# Patient Record
Sex: Male | Born: 1980
Health system: Southern US, Community
[De-identification: ages and names within clinical notes are randomized; demographics above are authoritative.]

## PROBLEM LIST (undated history)

## (undated) DIAGNOSIS — F1490 Cocaine use, unspecified, uncomplicated: Secondary | ICD-10-CM

## (undated) HISTORY — DX: Cocaine use, unspecified, uncomplicated: F14.90

## (undated) HISTORY — PX: OTHER SURGICAL HISTORY: SHX169

---

## 2009-04-18 ENCOUNTER — Emergency Department (HOSPITAL_COMMUNITY): Admission: EM | Admit: 2009-04-18 | Discharge: 2009-04-18 | Payer: Self-pay | Admitting: Emergency Medicine

## 2010-09-12 LAB — DIFFERENTIAL
Eosinophils Relative: 2 % (ref 0–5)
Lymphocytes Relative: 25 % (ref 12–46)
Lymphs Abs: 1.6 10*3/uL (ref 0.7–4.0)

## 2010-09-12 LAB — CBC
HCT: 49.2 % (ref 39.0–52.0)
Hemoglobin: 17.2 g/dL — ABNORMAL HIGH (ref 13.0–17.0)
Platelets: 324 10*3/uL (ref 150–400)
WBC: 6.6 10*3/uL (ref 4.0–10.5)

## 2010-09-12 LAB — POCT I-STAT, CHEM 8
BUN: 12 mg/dL (ref 6–23)
Chloride: 105 mEq/L (ref 96–112)
Creatinine, Ser: 0.9 mg/dL (ref 0.4–1.5)
Potassium: 3.6 mEq/L (ref 3.5–5.1)
Sodium: 141 mEq/L (ref 135–145)
TCO2: 26 mmol/L (ref 0–100)

## 2010-09-12 LAB — POCT CARDIAC MARKERS
CKMB, poc: 2.8 ng/mL (ref 1.0–8.0)
Myoglobin, poc: 127 ng/mL (ref 12–200)
Troponin i, poc: <0.05 ng/mL (ref 0.00–0.09)

## 2010-09-12 LAB — RAPID STREP SCREEN (MED CTR MEBANE ONLY): Streptococcus, Group A Screen (Direct): NEGATIVE

## 2014-09-21 ENCOUNTER — Emergency Department (INDEPENDENT_AMBULATORY_CARE_PROVIDER_SITE_OTHER)
Admission: EM | Admit: 2014-09-21 | Discharge: 2014-09-21 | Disposition: A | Discharge status: Home / Self Care | Payer: Self-pay | Source: Home / Self Care | Attending: Emergency Medicine | Admitting: Emergency Medicine

## 2014-09-21 ENCOUNTER — Encounter (HOSPITAL_COMMUNITY): Payer: Self-pay | Admitting: Emergency Medicine

## 2014-09-21 ENCOUNTER — Ambulatory Visit: Payer: Self-pay | Attending: Internal Medicine

## 2014-09-21 DIAGNOSIS — H6692 Otitis media, unspecified, left ear: Secondary | ICD-10-CM

## 2014-09-21 DIAGNOSIS — H7292 Unspecified perforation of tympanic membrane, left ear: Secondary | ICD-10-CM

## 2014-09-21 MED ORDER — CIPROFLOXACIN-DEXAMETHASONE 0.3-0.1 % OT SUSP
4.0000 [drp] | Freq: Two times a day (BID) | OTIC | Status: DC
Start: 2014-09-21 — End: 2015-01-18

## 2014-09-21 NOTE — ED Provider Notes (Signed)
CSN: [id number]     Arrival date & time 09/21/14  1326 History   First MD Initiated Contact with Patient 09/21/14 1359     Chief Complaint  Patient presents with  . Otalgia  . Dental Pain   (Consider location/radiation/quality/duration/timing/severity/associated sxs/prior Treatment) HPI Comments: Reports occasional "nasty" drainage from left ear canal. States he has not been seen or treated for this in the past.  PCP: none States he works at [employer]  Patient is a 34 y.o. male presenting with ear pain. The history is provided by the patient.  Otalgia Location:  Left Behind ear:  No abnormality Quality:  Sore Severity:  Mild Onset quality:  Gradual Duration:  3 months Timing:  Constant Progression:  Unchanged Associated symptoms: rhinorrhea     History reviewed. No pertinent past medical history. History reviewed. No pertinent past surgical history. No family history on file. History  Substance Use Topics  . Smoking status: Never Smoker   . Smokeless tobacco: Not on file  . Alcohol Use: No    Review of Systems  HENT: Positive for ear pain and rhinorrhea.   All other systems reviewed and are negative.   Allergies  Tylenol  Home Medications   Prior to Admission medications   Medication Sig Start Date End Date Taking? Authorizing Provider  ciprofloxacin-dexamethasone (CIPRODEX) otic suspension Place 4 drops into the left ear 2 (two) times daily. X 7 days 09/21/14   Jess BartersJennifer Lee H Presson, PA   BP 126/70 mmHg  Pulse 50  Temp(Src) 99 F (37.2 C) (Oral)  Resp 20  SpO2 99% Physical Exam  Constitutional: He is oriented to person, place, and time. He appears well-developed and well-nourished. No distress.  HENT:  Head: Normocephalic and atraumatic.  Right Ear: Hearing, tympanic membrane, external ear and ear canal normal.  Left Ear: There is drainage. No mastoid tenderness. Tympanic membrane is injected, perforated and erythematous. A middle ear effusion is  present. Decreased hearing is noted.  Nose: Mucosal edema and rhinorrhea present.  Mouth/Throat: Uvula is midline, oropharynx is clear and moist and mucous membranes are normal.  Eyes: Conjunctivae are normal.  Neck: Normal range of motion. Neck supple.  Cardiovascular: Normal rate, regular rhythm and normal heart sounds.   Pulmonary/Chest: Effort normal and breath sounds normal.  Musculoskeletal: Normal range of motion.  Neurological: He is alert and oriented to person, place, and time.  Skin: Skin is warm and dry.  Psychiatric: He has a normal mood and affect. His behavior is normal.  Nursing note and vitals reviewed.   ED Course  Procedures (including critical care time) Labs Review Labs Reviewed - No data to display  Imaging Review No results found.   MDM   1. Chronic otitis media of left ear, unspecified otitis media type   2. Perforated tympanic membrane, left    Ciprodex as directed with ENT follow up in 2 weeks     Ria ClockJennifer Lee H Presson, PA 09/21/14 1516

## 2014-09-21 NOTE — ED Notes (Signed)
C/o left ear pain onset 2 weeks Also reports dental pain onset 6 months +++. Has a loose tooth Denies fevers, chills Alert, no signs of acute distress.

## 2015-01-04 ENCOUNTER — Encounter (HOSPITAL_COMMUNITY): Payer: Self-pay | Admitting: Emergency Medicine

## 2015-01-04 ENCOUNTER — Emergency Department (INDEPENDENT_AMBULATORY_CARE_PROVIDER_SITE_OTHER)
Admission: EM | Admit: 2015-01-04 | Discharge: 2015-01-04 | Disposition: A | Discharge status: Home / Self Care | Payer: Self-pay | Source: Home / Self Care | Attending: Family Medicine | Admitting: Family Medicine

## 2015-01-04 DIAGNOSIS — B356 Tinea cruris: Secondary | ICD-10-CM

## 2015-01-04 DIAGNOSIS — R0981 Nasal congestion: Secondary | ICD-10-CM

## 2015-01-04 MED ORDER — CLOTRIMAZOLE 1 % EX CREA: TOPICAL_CREAM | CUTANEOUS | Status: DC

## 2015-01-04 MED ORDER — AMOXICILLIN 500 MG PO CAPS: 500.0000 mg | ORAL_CAPSULE | Freq: Three times a day (TID) | ORAL | Status: DC

## 2015-01-04 NOTE — ED Provider Notes (Signed)
CSN: [id number]     Arrival date & time 01/04/15  1401 History   First MD Initiated Contact with Patient 01/04/15 1530     No chief complaint on file.  (Consider location/radiation/quality/duration/timing/severity/associated sxs/prior Treatment) Patient is a 34 y.o. male presenting with cough. The history is provided by the patient. No language interpreter was used.  Cough Cough characteristics:  Non-productive Severity:  Moderate Onset quality:  Gradual Timing:  Constant Chronicity:  New Smoker: no   Context: upper respiratory infection   Worsened by:  Nothing tried Ineffective treatments:  None tried Associated symptoms: rhinorrhea   Pt reports facial pressure, nasal drainage since swimming several weeks ago.  Pt also has itcing around end of penis  History reviewed. No pertinent past medical history. History reviewed. No pertinent past surgical history. No family history on file. History  Substance Use Topics  . Smoking status: Never Smoker   . Smokeless tobacco: Not on file  . Alcohol Use: No    Review of Systems  HENT: Positive for rhinorrhea.   Respiratory: Positive for cough.   Genitourinary: Positive for penile pain.  All other systems reviewed and are negative.   Allergies  Tylenol  Home Medications   Prior to Admission medications   Medication Sig Start Date End Date Taking? Authorizing Provider  ciprofloxacin-dexamethasone (CIPRODEX) otic suspension Place 4 drops into the left ear 2 (two) times daily. X 7 days Patient not taking: Reported on 01/04/2015 09/21/14   Ria Clock, PA   There were no vitals taken for this visit. Physical Exam  Constitutional: He is oriented to person, place, and time. He appears well-developed and well-nourished.  HENT:  Head: Normocephalic.  Right Ear: External ear normal.  Left Ear: External ear normal.  Mouth/Throat: Oropharynx is clear and moist.  Edematous nasal mucosa,  Tender maxillary sinuses  Eyes: EOM  are normal.  Neck: Normal range of motion.  Pulmonary/Chest: Effort normal.  Abdominal: He exhibits no distension.  Genitourinary:  Fine raised bumps under foreskin,  Looks like yeast  Musculoskeletal: Normal range of motion.  Neurological: He is alert and oriented to person, place, and time.  Skin: Skin is warm.  Psychiatric: He has a normal mood and affect.  Nursing note and vitals reviewed.   ED Course  Procedures (including critical care time) Labs Review Labs Reviewed - No data to display  Imaging Review No results found.   MDM   1. Nasal sinus congestion   2. Tinea cruris    amoxicillian Lotrimin avs   Elson Areas, PA-C 01/04/15 1607

## 2015-01-04 NOTE — ED Notes (Signed)
Translator phone in use

## 2015-01-04 NOTE — ED Notes (Signed)
Patient has multiple complaints. Department translator phone used to assess patient.  PA utilized Transport planner

## 2015-01-04 NOTE — Discharge Instructions (Signed)
Infeccin de las vas areas superiores en los adultos (Upper Respiratory Infection, Adult)  La infeccin respiratoria de las vas areas superiores se conoce tambin como resfro comn. Las vas areas superiores [employer] senos nasales, la garganta, la trquea, y los bronquios. Los bronquios son las vas areas que conducen el aire a los pulmones. La mayor parte de las personas mejora luego de una [place], Armed forces training and education officer los sntomas pueden durar [employer]. La tos residual puede durar ms. CAUSAS Varios tipos de virus pueden causar la infeccin de los tejidos que cubren las vas areas superiores. Los tejidos se irritan y se inflaman y se originan secreciones. Tambin es frecuente la produccin de moco. El resfro es contagioso. El virus se disemina fcilmente a otras personas por contacto oral. Aqu se incluyen los besos, el compartir un vaso y el toser o Brewing technologist. Tambin puede diseminarse tocndose la boca o la [place] y luego tocando una superficie que luego tocan Producer, television/film/video.  SNTOMAS Los sntomas se desarrollan entre uno y tres das luego de Dietitian en contacto con el virus. Pueden variar de Ardelia Mems persona a otra. Incluyen:  Secrecin nasal.  Estornudos  Congestin nasal.  Irritacin de los senos nasales.  Dolor de Investment banker, operational.  Prdida de la voz (laringitis).  Tos.  Fatiga.  Dolores musculares.  Prdida del apetito.  Dolor de [place].  Fiebre no muy elevada. DIAGNSTICO Puede diagnosticarse a s mismo la infeccin respiratoria, segn los sntomas habituales, ya que la mayor parte de las personas se resfra dos o tres veces al ao. El profesional puede confirmarlo basndose en el examen fsico. Lo ms importante es que el profesional verifique que los sntomas no se deben a otra enfermedad como anginas, sinusitis, neumona, asma o epiglotitis. Para diagnosticar el resfrio comn, no es necesario que haga anlisis de [place], pruebas en la garganta o radiografas, pero en algunos  casos puede ser de utilidad para excluir otros problemas ms graves. El mdico decidir si necesita otras pruebas. RIESGOS Y COMPLICACIONES Tendr mayor riesgo de sufrir un resfro grave si consume cigarrillos, sufre una enfermedad cardaca (como insuficiencia cardaca) o pulmonar crnica (como asma) o si tiene un debilitamiento del sistema inmunolgico. Las personas muy jvenes o muy mayores tienen riesgo de sufrir infecciones ms graves. La sinusitis bacteriana, las infecciones del odo medio y la neumona bacteriana pueden complicar el resfro comn. El resfro puede exacerbar el asma y la enfermedad pulmonar obstructiva crnica. En algunos casos estas complicaciones requieren la atencin en un servicio de emergencias y pueden poner en peligro la vida. PREVENCIN La mejor manera de protegerse para no contraer un resfro es Theatre manager una buena higiene. Evite el contacto bucal o de las manos con personas con sntomas de resfro. Si se produce el contacto, lvese las manos con frecuencia. No hay pruebas firmes que indiquen que la vitamina C, la vitamina E, la equincea o la actividad fsica reduzcan las posibilidades de tener una infeccin. Sin embargo, siempre se recomienda Scientific laboratory technician y Lucilla Edin buena nutricin. TRATAMIENTO El tratamiento est dirigido a Herbalist sntomas. Esta enfermedad no tiene [place]. Los antibiticos no son eficaces, ya que esta infeccin la causa un virus y no una bacteria. El tratamiento incluye:  Aumente la ingesta de lquidos. Consumo de bebidas deportivas, que proporcionan electrolitos,azcares e hidratacin.  Inhale vapor caliente (de un vaporizador o de la ducha).  Tomar sopa de pollo u otros lquidos claros, y [employer] buena nutricin.  Descanse lo suficiente.  Haga grgaras o coma pastillas para Public house manager  las molestias.  Control de la fiebre con ibuprofeno o acetaminofen, segn las indicaciones del mdico.  Aumento del uso del inhalador, si sufre asma. Las  pastillas y los geles de zinc durante las primeras 24 horas de iniciado el resfro comn, pueden disminuir la duracin y Paramedic la gravedad de los sntomas. Los medicamentos para Chief Technology Officer pueden disminuir la fiebre, Paramedic los dolores musculares y Chief Technology Officer de Advertising copywriter. Se dispone de una gran variedad de medicamentos de venta libre para tratar la congestin y la secrecin nasal. El profesional podr recomendarle inhalantes para los otros sntomas. INSTRUCCIONES PARA EL CUIDADO DOMICILIARIO  Utilice los medicamentos de venta libre o de prescripcin para Chief Technology Officer, el malestar o la [place], segn se lo indique el profesional que lo asiste.  Utilice un vaporizador caliente o inhale vapor, haciendo salir agua de la ducha para aumentar la humedad [place]. Esto mantendr las secreciones hmedas y Community education officer ms fcil respirar.  Beba gran cantidad de lquido para mantener la orina de tono claro o color amarillo plido.  Descanse todo lo que pueda.  Regrese a su trabajo cuando la temperatura se haya normalizado, o cuando el profesional que lo asiste se lo indique. Quizs sea necesario que permanezca en su casa durante un tiempo ms prolongado para Buyer, retail a Economist. Tambin puede utilizar un barbijo y ser cuidadoso con el lavado de manos para evitar la diseminacin del virus. SOLICITE ATENCIN MDICA SI:  Luego de los primeros das siente que empeora en vez de [place].  Necesita que Occupational psychologist brinde ms informacin relacionada con los medicamentos para [employer] sntomas.  Siente escalofros, le falta el aire o escupe moco de color marrn o rojo. Estos pueden ser sntomas de neumona.  Tiene una secrecin nasal de color amarillo o marrn, o siente dolor en el rostro, especialmente cuando se inclina hacia adelante. Estos pueden ser sntomas de sinusitis.  Tiene fiebre, siente el cuello hinchado, tiene dolor al tragar u observa manchas blancas en el fondo de la garganta.  Estos pueden ser sntomas de angina por estreptococo. Romona Curls ATENCIN MDICA DE INMEDIATO SI:  Lance Muss.  Comienza a sentir Herbalist de cabeza intenso o persistente, dolor de odos, en el seno nasal o en el pecho.  Tiene tos y esta se prolonga demasiado, tose y escupe sangre, la mucosidad habitual se modifica (si tiene una enfermedad pulmonar crnica) o respira con dificultad.  Siente rigidez en el cuello o dolor de cabeza intenso. Document Released: 03/06/2005 Document Revised: 08/19/2011 [facility] Patient Information 2015 [place], [place]. This information is not intended to replace advice given to you by your health care provider. Make sure you discuss any questions you have with your health care provider. Prurito en la Ingle (Jock Itch) Prurito en la ingle es una infeccin por hongos en la piel de la ingle. A veces se lo denomina "culebrilla" pero no proviene de un gusano. Un hongo es un tipo de germen que crece en lugares oscuros y hmedos.  CAUSAS La infeccin podr diseminarse:  Por infeccin de hongos en cualquier parte del cuerpo (como el pie de atleta).  Compartir toallas o ropa. Esta infeccin es ms frecuente en:  Climas clidos y hmedos.  Personas que [place] ropa [place] o trajes de bao hmedos por [employer].  Deportistas.  Personas con sobrepeso.  Personas con diabtes. SNTOMAS Prurito en la ingle causa los siguientes sntomas:  Puntos rojos, rosados o marrones en la ingle. Puede expandirse a los muslos, nalgas y  ano.  Picazn. DIAGNSTICO El profesional hace el diagnstico a travs de la observacin de la erupcin. A veces se realiza un raspado de la piel para realizar un anlisis. El anlisis se obtiene mirando el microscopio o mediante un cultivo (prueba para Therapist, music). Este puede tomar [employer]. TRATAMIENTO Prurito en la ingle puede tratarse:  Cremas para la piel o pomadas para matar hongos.  Medicamentos  por va oral que destruyen los hongos.  Cremas para la piel o pomadas para calmar la picazn.  Compresas o polvos con medicamentos para secar la piel infectada. INSTRUCCIONES PARA EL CUIDADO DOMICILIARIO  Asegrese de que la erupcin desaparezca completamente con el tratamiento. Siga las indicaciones del mdico. Puede llevar un par de semanas completar la curacin. Si no se trata durante el tiempo suficiente, esta lesin Metallurgist.  Use ropas sueltas.  Los hombres deben usar boxers de algodn cortos.  Las mujeres deben usar ropa interior de algodn.  Evite los baos calientes.  Seque la zona de la ingle luego del bao. SOLICITE ANTENCIN MDICA SI:  La erupcin empeora.  Se extiende.  Aparece nuevamente luego de completar el tratamiento.  No se cura en el trmino de 4 semanas. Las infecciones micticas son lentas en responder al TEFL teacher. Persiste un enrojecimiento durante varias semanas despus de que el hongo haya desaparecido. SOLICITE ATENCIN MDICA DE INMEDIATO SI:  La zona se vuelve roja, caliente, se hincha o duele.  Tiene fiebre. Document Released: 03/06/2005 Document Revised: 08/19/2011 [facility] Patient Information 2015 [place], [place]. This information is not intended to replace advice given to you by your health care provider. Make sure you discuss any questions you have with your health care provider. Upper Respiratory Infection, Adult An upper respiratory infection (URI) is also sometimes known as the common cold. The upper respiratory tract includes the nose, sinuses, throat, trachea, and bronchi. Bronchi are the airways leading to the lungs. Most people improve within 1 week, but symptoms can last up to 2 weeks. A residual cough may last even longer.  CAUSES Many different viruses can infect the tissues lining the upper respiratory tract. The tissues become irritated and inflamed and often become very moist. Mucus production is also common. A cold is  contagious. You can easily spread the virus to others by oral contact. This includes kissing, sharing a glass, coughing, or sneezing. Touching your mouth or nose and then touching a surface, which is then touched by another person, can also spread the virus. SYMPTOMS  Symptoms typically develop 1 to 3 days after you come in contact with a cold virus. Symptoms vary from person to person. They may include:  Runny nose.  Sneezing.  Nasal congestion.  Sinus irritation.  Sore throat.  Loss of voice (laryngitis).  Cough.  Fatigue.  Muscle aches.  Loss of appetite.  Headache.  Low-grade fever. DIAGNOSIS  You might diagnose your own cold based on familiar symptoms, since most people get a cold 2 to 3 times a year. Your caregiver can confirm this based on your exam. Most importantly, your caregiver can check that your symptoms are not due to another disease such as strep throat, sinusitis, pneumonia, asthma, or epiglottitis. Blood tests, throat tests, and X-rays are not necessary to diagnose a common cold, but they may sometimes be helpful in excluding other more serious diseases. Your caregiver will decide if any further tests are required. RISKS AND COMPLICATIONS  You may be at risk for a more severe case of  the common cold if you smoke cigarettes, have chronic heart disease (such as heart failure) or lung disease (such as asthma), or if you have a weakened immune system. The very young and very old are also at risk for more serious infections. Bacterial sinusitis, middle ear infections, and bacterial pneumonia can complicate the common cold. The common cold can worsen asthma and chronic obstructive pulmonary disease (COPD). Sometimes, these complications can require emergency medical care and may be life-threatening. PREVENTION  The best way to protect against getting a cold is to practice good hygiene. Avoid oral or hand contact with people with cold symptoms. Wash your hands often if  contact occurs. There is no clear evidence that vitamin C, vitamin E, echinacea, or exercise reduces the chance of developing a cold. However, it is always recommended to get plenty of rest and practice good nutrition. TREATMENT  Treatment is directed at relieving symptoms. There is no cure. Antibiotics are not effective, because the infection is caused by a virus, not by bacteria. Treatment may include:  Increased fluid intake. Sports drinks offer valuable electrolytes, sugars, and fluids.  Breathing heated mist or steam (vaporizer or shower).  Eating chicken soup or other clear broths, and maintaining good nutrition.  Getting plenty of rest.  Using gargles or lozenges for comfort.  Controlling fevers with ibuprofen or acetaminophen as directed by your caregiver.  Increasing usage of your inhaler if you have asthma. Zinc gel and zinc lozenges, taken in the first 24 hours of the common cold, can shorten the duration and lessen the severity of symptoms. Pain medicines may help with fever, muscle aches, and throat pain. A variety of non-prescription medicines are available to treat congestion and runny nose. Your caregiver can make recommendations and may suggest nasal or lung inhalers for other symptoms.  HOME CARE INSTRUCTIONS   Only take over-the-counter or prescription medicines for pain, discomfort, or fever as directed by your caregiver.  Use a warm mist humidifier or inhale steam from a shower to increase air moisture. This may keep secretions moist and make it easier to breathe.  Drink enough water and fluids to keep your urine clear or pale yellow.  Rest as needed.  Return to work when your temperature has returned to normal or as your caregiver advises. You may need to stay home longer to avoid infecting others. You can also use a face mask and careful hand washing to prevent spread of the virus. SEEK MEDICAL CARE IF:   After the first few days, you feel you are getting worse  rather than better.  You need your caregiver's advice about medicines to control symptoms.  You develop chills, worsening shortness of breath, or brown or red sputum. These may be signs of pneumonia.  You develop yellow or brown nasal discharge or pain in the face, especially when you bend forward. These may be signs of sinusitis.  You develop a fever, swollen neck glands, pain with swallowing, or white areas in the back of your throat. These may be signs of strep throat. SEEK IMMEDIATE MEDICAL CARE IF:   You have a fever.  You develop severe or persistent headache, ear pain, sinus pain, or chest pain.  You develop wheezing, a prolonged cough, cough up blood, or have a change in your usual mucus (if you have chronic lung disease).  You develop sore muscles or a stiff neck. Document Released: 11/20/2000 Document Revised: 08/19/2011 Document Reviewed: 09/01/2013 [facility] Patient Information 2015 [place], [place]. This information is not intended  to replace advice given to you by your health care provider. Make sure you discuss any questions you have with your health care provider. ° °

## 2015-01-18 ENCOUNTER — Ambulatory Visit: Payer: Self-pay | Attending: Family Medicine | Admitting: Family Medicine

## 2015-01-18 ENCOUNTER — Encounter: Payer: Self-pay | Admitting: Family Medicine

## 2015-01-18 VITALS — BP 118/70 | HR 62 | Temp 98.0°F | Resp 16 | Ht 62.5 in | Wt 152.0 lb

## 2015-01-18 DIAGNOSIS — B488 Other specified mycoses: Secondary | ICD-10-CM | POA: Insufficient documentation

## 2015-01-18 DIAGNOSIS — Z23 Encounter for immunization: Secondary | ICD-10-CM

## 2015-01-18 DIAGNOSIS — Z114 Encounter for screening for human immunodeficiency virus [HIV]: Secondary | ICD-10-CM | POA: Insufficient documentation

## 2015-01-18 DIAGNOSIS — N481 Balanitis: Secondary | ICD-10-CM

## 2015-01-18 DIAGNOSIS — R0981 Nasal congestion: Secondary | ICD-10-CM | POA: Insufficient documentation

## 2015-01-18 LAB — GLUCOSE, POCT (MANUAL RESULT ENTRY): POC GLUCOSE: 83 mg/dL (ref 70–99)

## 2015-01-18 LAB — POCT GLYCOSYLATED HEMOGLOBIN (HGB A1C): Hemoglobin A1C: 5.5

## 2015-01-18 MED ORDER — FLUTICASONE PROPIONATE 50 MCG/ACT NA SUSP: 2.0000 | Freq: Every day | NASAL | Status: DC

## 2015-01-18 MED ORDER — CLOTRIMAZOLE 1 % EX CREA: TOPICAL_CREAM | CUTANEOUS | Status: AC

## 2015-01-18 NOTE — Progress Notes (Signed)
Establish Care Complaining of SHOB, worsen at night. Stated has problems with ED

## 2015-01-18 NOTE — Progress Notes (Signed)
   Subjective:    Patient ID: Andrew Sandoval, male    DOB: Aug 04, 1980, 34 y.o.   MRN: [id number] CC: establish care, SOB  HPI  Spanish interpreter used ID # [id number] 34 yo M presents to establish care and discuss the following  1. Nasal congestion: patient seen at urgent care 2 weeks ago for nasal congestion. He was treated with amoxicillin. amoxillin has not helped with symptoms. He reports nasal congestion with tenderness and trouble breathing through his nose in the early mornings. He has a history of cocaine abuse from 2010-2014. He reports these symptoms have been present since he stopped using cocaine. He also plays soccer and reports nose bleeds after a "header" that are self-limited.    2. Penile fungus: taking clotrimazole cream. No history of fugal infection. Infection is improving. No history of diabetes.  HIV status is unknown, no recent testing.   Past Medical History  Diagnosis Date  . Cocaine use 2006-2010   Family History  Problem Relation Age of Onset  . Diabetes Neg Hx     Social History  Substance Use Topics  . Smoking status: Never Smoker   . Smokeless tobacco: Not on file  . Alcohol Use: No   Review of Systems  Constitutional: Negative for fever, chills, fatigue and unexpected weight change.  Eyes: Negative for visual disturbance.  Respiratory: Negative for cough and shortness of breath.   Cardiovascular: Negative for chest pain, palpitations and leg swelling.  Gastrointestinal: Negative for nausea, vomiting, abdominal pain, diarrhea, constipation and blood in stool.  Endocrine: Negative for polydipsia, polyphagia and polyuria.  Musculoskeletal: Negative for myalgias, back pain, arthralgias, gait problem and neck pain.  Skin: Negative for rash.  Allergic/Immunologic: Negative for immunocompromised state.  Hematological: Negative for adenopathy. Does not bruise/bleed easily.  Psychiatric/Behavioral: Negative for suicidal ideas, sleep disturbance and  dysphoric mood. The patient is not nervous/anxious.       Objective:   Physical Exam  Constitutional: He appears well-developed and well-nourished. No distress.  HENT:  Head: Normocephalic and atraumatic.  Nose: Mucosal edema present. No nose lacerations, sinus tenderness or septal deviation. No epistaxis.    Eyes: Conjunctivae are normal. Pupils are equal, round, and reactive to light.  Neck: Normal range of motion. Neck supple.  Cardiovascular: Normal rate, regular rhythm, normal heart sounds and intact distal pulses.   Pulmonary/Chest: Effort normal and breath sounds normal.  Musculoskeletal: He exhibits no edema.  Neurological: He is alert.  Skin: Skin is warm and dry. No rash noted. No erythema.  Psychiatric: He has a normal mood and affect.  BP 118/70 mmHg  Pulse 62  Temp(Src) 98 F (36.7 C) (Oral)  Resp 16  Ht 5' 2.5" (1.588 m)  Wt 152 lb (68.947 kg)  BMI 27.34 kg/m2  SpO2 98%  Lab Results  Component Value Date   HGBA1C 5.50 01/18/2015       Assessment & Plan:

## 2015-01-18 NOTE — Patient Instructions (Signed)
[name],  Thank you for coming in today  1. Nasal congestion: No evidence of infection There is narrowing in your nose Use flonase to help reduce sinus swelling and improve airflow  2. 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 Armen Pickup

## 2015-01-18 NOTE — Assessment & Plan Note (Signed)
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

## 2015-01-18 NOTE — Assessment & Plan Note (Signed)
Nasal congestion: this chronic for 6 years No evidence of infection There is narrowing in your nose Use flonase to help reduce sinus swelling and improve airflow

## 2015-01-18 NOTE — Assessment & Plan Note (Signed)
  Checking screening HIV

## 2015-01-19 LAB — HIV ANTIBODY (ROUTINE TESTING W REFLEX): HIV 1&2 Ab, 4th Generation: NONREACTIVE

## 2015-01-25 ENCOUNTER — Telehealth: Payer: Self-pay | Admitting: Family Medicine

## 2015-01-25 NOTE — Telephone Encounter (Signed)
Patient came in requesting results from the lab. Please follow up.

## 2015-02-02 ENCOUNTER — Telehealth: Payer: Self-pay | Admitting: *Deleted

## 2015-02-02 NOTE — Telephone Encounter (Signed)
Date of birth verified  Lab results given to pt  Pt verbalize understanding  Requesting copy

## 2015-02-02 NOTE — Telephone Encounter (Signed)
-----   Message from Dessa Phi, MD sent at 01/19/2015  9:10 AM EDT ----- Screening HIV negative

## 2015-02-10 ENCOUNTER — Telehealth: Payer: Self-pay | Admitting: Family Medicine

## 2015-02-10 DIAGNOSIS — N481 Balanitis: Secondary | ICD-10-CM

## 2015-02-10 MED ORDER — KETOCONAZOLE 2 % EX CREA: 1.0000 "application " | TOPICAL_CREAM | Freq: Every day | CUTANEOUS | Status: DC

## 2015-02-10 NOTE — Telephone Encounter (Signed)
Patient was prescribed Outside name: CLOTRIMAZOLE 1% CREAM 15GM... But he is unable to find this rx at a pharmacy. He is wanting to know if their is an alternative medication. Please follow up.

## 2015-02-10 NOTE — Telephone Encounter (Signed)
Ketoconazole cream sent to onsite pharmacy to replace clotrimazole, patient will be contacted

## 2015-04-18 ENCOUNTER — Ambulatory Visit: Payer: Self-pay | Attending: Family Medicine

## 2015-04-21 ENCOUNTER — Ambulatory Visit: Payer: Self-pay | Attending: Family Medicine | Admitting: Clinical

## 2015-04-21 DIAGNOSIS — Z709 Sex counseling, unspecified: Secondary | ICD-10-CM

## 2015-04-21 NOTE — Progress Notes (Signed)
ASSESSMENT: Pt currently experiencing concern about his sexual functioning. He needs to f/u with PCP, and would benefit from supportive counseling regarding coping with issues related to sexual functioning.  Stage of Change: contemplation  PLAN: 1. F/U with behavioral health consultant in as needed 2. Psychiatric Medications: none. 3. Behavioral recommendation(s):   -Consider using distracting thoughts to increase time between arousal and ejaculation -Consider contacting [facility] Spanish-speaking counselor, as needed -Make appointment to see PCP about lumps on testicles and sperm quality  SUBJECTIVE: Pt. referred by financial counseling for "low self-esteem":  Pt. reports the following symptoms/concerns: Pt states that his main concern is that he masturbates excessively. He does not know if it is normal to masturbate daily (sometimes up to 5x daily), he does not currently have a partner, but worries that his ejaculate is too watery, and is not sure whether or not his past cocaine and alcohol problems, as well as frequent masturbation, may affect his ability to father a child. He is also concerned that he may not be able to please a wife one day because he only lasts 3 minutes during intercourse prior to ejaculation. And he worries about lumps on his testicles.  Duration of problem: lifelong Severity: moderate  OBJECTIVE: Orientation & Cognition: Oriented x3. Thought processes normal and appropriate to situation. Mood: appropriate. Affect: appropriate Appearance: appropriate Risk of harm to self or others: no known risk of harm to self or others Substance use: none current Assessments administered: none  Diagnosis: Sexual counseling CPT Code: Z70.9 -------------------------------------------- Other(s) present in the room: Spanish interpreter (screen)  Time spent with patient in exam room: 40 minutes

## 2015-05-16 ENCOUNTER — Encounter: Payer: Self-pay | Admitting: Family Medicine

## 2015-05-16 ENCOUNTER — Ambulatory Visit: Payer: Self-pay | Attending: Family Medicine | Admitting: Family Medicine

## 2015-05-16 VITALS — BP 127/78 | HR 79 | Temp 98.1°F | Resp 16 | Ht 62.5 in | Wt 155.0 lb

## 2015-05-16 DIAGNOSIS — Z0189 Encounter for other specified special examinations: Secondary | ICD-10-CM | POA: Insufficient documentation

## 2015-05-16 DIAGNOSIS — Z886 Allergy status to analgesic agent status: Secondary | ICD-10-CM | POA: Insufficient documentation

## 2015-05-16 DIAGNOSIS — Z0000 Encounter for general adult medical examination without abnormal findings: Secondary | ICD-10-CM

## 2015-05-16 DIAGNOSIS — N481 Balanitis: Secondary | ICD-10-CM | POA: Insufficient documentation

## 2015-05-16 DIAGNOSIS — B353 Tinea pedis: Secondary | ICD-10-CM | POA: Insufficient documentation

## 2015-05-16 DIAGNOSIS — F1421 Cocaine dependence, in remission: Secondary | ICD-10-CM | POA: Insufficient documentation

## 2015-05-16 LAB — COMPLETE METABOLIC PANEL WITH GFR
ALBUMIN: 4.3 g/dL (ref 3.6–5.1)
ALK PHOS: 87 U/L (ref 40–115)
ALT: 67 U/L — ABNORMAL HIGH (ref 9–46)
AST: 35 U/L (ref 10–40)
BILIRUBIN TOTAL: 0.9 mg/dL (ref 0.2–1.2)
BUN: 17 mg/dL (ref 7–25)
CO2: 25 mmol/L (ref 20–31)
Calcium: 9 mg/dL (ref 8.6–10.3)
Chloride: 101 mmol/L (ref 98–110)
Creat: 0.9 mg/dL (ref 0.60–1.35)
GFR, Est African American: >89 mL/min (ref >=60)
GLUCOSE: 82 mg/dL (ref 65–99)
Potassium: 4.1 mmol/L (ref 3.5–5.3)
SODIUM: 134 mmol/L — AB (ref 135–146)
TOTAL PROTEIN: 7.3 g/dL (ref 6.1–8.1)

## 2015-05-16 LAB — CBC
HCT: 49.2 % (ref 39.0–52.0)
HEMOGLOBIN: 17 g/dL (ref 13.0–17.0)
MCH: 31 pg (ref 26.0–34.0)
MCHC: 34.6 g/dL (ref 30.0–36.0)
MCV: 89.6 fL (ref 78.0–100.0)
MPV: 9.3 fL (ref 8.6–12.4)
PLATELETS: 312 10*3/uL (ref 150–400)
RBC: 5.49 MIL/uL (ref 4.22–5.81)
RDW: 13.7 % (ref 11.5–15.5)
WBC: 6.3 10*3/uL (ref 4.0–10.5)

## 2015-05-16 LAB — POCT URINALYSIS DIPSTICK
Bilirubin, UA: NEGATIVE
GLUCOSE UA: NEGATIVE
Ketones, UA: NEGATIVE
LEUKOCYTES UA: NEGATIVE
NITRITE UA: NEGATIVE
PROTEIN UA: NEGATIVE
SPEC GRAV UA: 1.01
UROBILINOGEN UA: 0.2
pH, UA: 7

## 2015-05-16 MED ORDER — KETOCONAZOLE 2 % EX CREA: 1.0000 "application " | TOPICAL_CREAM | Freq: Every day | CUTANEOUS | Status: DC

## 2015-05-16 NOTE — Patient Instructions (Signed)
Andrew Sandoval was seen today for annual exam.  Diagnoses and all orders for this visit:  Wellness examination -     POCT urinalysis dipstick -     COMPLETE METABOLIC PANEL WITH GFR -     CBC -     Urine cytology ancillary only  Balanitis -     Ambulatory referral to Urology  Tinea pedis, right -     ketoconazole (NIZORAL) 2 % cream; Apply 1 application topically daily.    You will be called with lab results  F/u in 6 months, sooner if needed  Dr. Armen PickupFunches   Pie de atleta (Athlete's Foot) El pie de atleta es una infeccin de la piel causada por un hongo. Se observa comnmente entre o debajo de los dedos de los pies. Tambin puede aparecer en la planta del pie. Se contagia de una persona a otra al compartir toallas o superficies de duchas. CUIDADOS EN EL HOGAR  Slo tome medicamentos como lo indique su mdico. No utilice cremas con corticoides.  Lvese los pies CarMaxtodos los das. Squelos bien, United Stationersespecialmente entre los dedos.  Cmbiese los calcetines CarMaxtodos los das. Use calcetines de algodn o lana.  Cambie sus calcetines 2  3 veces por da en poca de calor.  Use sandalias o zapatillas de lona que tengan buena ventilacin.  Si tiene ampollas, remoje sus pies en una solucin, segn las indicaciones de su mdico. Hgalo durante 20 a 30 minutos, 2 veces por Futures traderda. Seque sus pies despus de remojarlos.  No comparta toallas.  Use sandalias cuando comparta vestuarios o duchas. SOLICITE AYUDA DE INMEDIATO SI:   Tiene fiebre.  Su pie est inflamado (hinchado), le duele, est caliente o rojo.  No mejora luego de 7 [address]das de tratamiento.  An sufre pie de atleta despus de 30 das.  Tiene problemas relacionados con los medicamentos. ASEGRESE DE QUE:   Comprende estas instrucciones.  Controlar su enfermedad.  Solicitar ayuda de inmediato si no mejora o si empeora.   Esta informacin no tiene Theme park managercomo fin reemplazar el consejo del mdico. Asegrese de hacerle al mdico cualquier  pregunta que tenga.   Document Released: 06/29/2010 Document Revised: 08/19/2011 Elsevier Interactive Patient Education [employer]2016 Elsevier Inc.

## 2015-05-16 NOTE — Progress Notes (Signed)
SUBJECTIVE:  Andrew Sandoval is a 34 y.o. male presenting for his annual checkup.  Social History  Substance Use Topics  . Smoking status: Never Smoker   . Smokeless tobacco: Not on file  . Alcohol Use: No   Past Medical History  Diagnosis Date  . Cocaine use 2006-2010   Current Outpatient Prescriptions  Medication Sig Dispense Refill  . fluticasone (FLONASE) 50 MCG/ACT nasal spray Place 2 sprays into both nostrils daily. 16 g 2  . ketoconazole (NIZORAL) 2 % cream Apply 1 application topically daily. 15 g 0   No current facility-administered medications for this visit.   Allergies: Tylenol   ROS:  Feeling well. No dyspnea or chest pain on exertion. No abdominal pain, change in bowel habits, black or bloody stools. No urinary tract or prostatic symptoms. No neurological complaints.  OBJECTIVE:  The patient appears well, alert, oriented x 3, in no distress.  BP 127/78 mmHg  Pulse 79  Temp(Src) 98.1 F (36.7 C) (Oral)  Resp 16  Ht 5' 2.5" (1.588 m)  Wt 155 lb (70.308 kg)  BMI 27.88 kg/m2  SpO2 97% ENT normal.  Neck supple. No adenopathy or thyromegaly. PERLA. Lungs are clear, good air entry, no wheezes, rhonchi or rales. S1 and S2 normal, no murmurs, regular rate and rhythm. Abdomen is soft without tenderness, guarding, mass or organomegaly. GU exam: no penile lesions or discharge, no testicular masses or tenderness, no hernias, HERNIA EXAM: no hernias found on exam, TESTICULAR EXAM: normal, no masses, PENIS: uncircumcised, scant debris when foreskin retracted at the 12 o' clock position, SCROTUM: normal, no masses.  Extremities show no edema, normal peripheral pulses. Neurological is normal without focal findings. SKIN: no lesions or bruises. Erythematous hyperpigmented rash on dorsum of R foot.   ASSESSMENT:  healthy adult male  PLAN:  continue current healthy lifestyle patterns and return for routine annual checkups  Urology referral for possible circumcision Topical  antifungal for tinea pedis

## 2015-05-16 NOTE — Progress Notes (Signed)
Annual physical Complaining of abscess on genital area   Hx fungal infection  No pain today  No tobacco user  No suicidal thoughts in the past two weeks

## 2015-05-17 LAB — URINE CYTOLOGY ANCILLARY ONLY
Chlamydia: NEGATIVE
Neisseria Gonorrhea: NEGATIVE
Trichomonas: NEGATIVE

## 2015-05-19 ENCOUNTER — Telehealth: Payer: Self-pay | Admitting: *Deleted

## 2015-05-19 NOTE — Telephone Encounter (Signed)
Date of birth verified by pt Results given  Negative CG/Chlam Lightly elevated ALT  Pt verbalized understanding  Information given in Spanish

## 2015-05-19 NOTE — Telephone Encounter (Signed)
-----   Message from Dessa PhiJosalyn Funches, MD sent at 05/17/2015  8:25 AM EST ----- Labs normal except for slightly elevated ALT, no additional testing needed

## 2015-05-19 NOTE — Telephone Encounter (Signed)
-----   Message from Dessa PhiJosalyn Funches, MD sent at 05/17/2015  3:42 PM EST ----- Negative urine GC/chlam and trich

## 2015-05-22 LAB — URINE CYTOLOGY ANCILLARY ONLY: CANDIDA VAGINITIS: NEGATIVE

## 2015-05-23 ENCOUNTER — Telehealth: Payer: Self-pay | Admitting: *Deleted

## 2015-05-23 NOTE — Telephone Encounter (Signed)
Date of birth verified by pt  Urine results given to pt  Pt verbalized understanding  Information given in spanish

## 2015-05-23 NOTE — Telephone Encounter (Signed)
-----   Message from Dessa PhiJosalyn Funches, MD sent at 05/23/2015  8:59 AM EST ----- Urine negative for yeast

## 2015-08-03 ENCOUNTER — Encounter: Payer: Self-pay | Admitting: Family Medicine

## 2015-08-03 NOTE — Progress Notes (Signed)
Patient ID: Andrew Sandoval, male   DOB: [birth year], 35 y.o.   MRN: [id number] Received a response for [facility] regarding referral, since balanitis improved with treatment, referral determined to be unnecessary.

## 2015-08-21 MED FILL — FLUTICASONE PROP 50 MCG SPR: 50 | 30 days supply | Qty: 16 | Fill #2

## 2015-10-17 ENCOUNTER — Encounter: Payer: Self-pay | Admitting: Family Medicine

## 2015-10-17 ENCOUNTER — Ambulatory Visit: Payer: Self-pay | Attending: Family Medicine | Admitting: Family Medicine

## 2015-10-17 VITALS — BP 114/70 | HR 53 | Temp 98.3°F | Resp 16 | Ht 62.5 in | Wt 153.0 lb

## 2015-10-17 DIAGNOSIS — K625 Hemorrhage of anus and rectum: Secondary | ICD-10-CM | POA: Insufficient documentation

## 2015-10-17 DIAGNOSIS — L290 Pruritus ani: Secondary | ICD-10-CM | POA: Insufficient documentation

## 2015-10-17 DIAGNOSIS — Z79899 Other long term (current) drug therapy: Secondary | ICD-10-CM | POA: Insufficient documentation

## 2015-10-17 LAB — HEMOCCULT GUIAC POC 1CARD (OFFICE): FECAL OCCULT BLD: NEGATIVE

## 2015-10-17 MED ORDER — HYDROCORTISONE ACE-PRAMOXINE 2.5-1 % RE CREA: 1.0000 "application " | TOPICAL_CREAM | Freq: Three times a day (TID) | RECTAL | Status: DC

## 2015-10-17 MED FILL — HYDROCORT-PRAMOXINE 2.5-1%: 2.5-1 | 10 days supply | Qty: 30 | Fill #0

## 2015-10-17 NOTE — Patient Instructions (Addendum)
Andrew Sandoval was seen today for stool color change.  Diagnoses and all orders for this visit:  Anal itch -     hydrocortisone-pramoxine (ANALPRAM HC) 2.5-1 % rectal cream; Place 1 application rectally 3 (three) times daily.  Rectal bleeding  suspect anal fissure   F.u in 6 months sooner if needed   Dr. Lavonia DanaFunches    Fisura anal en los adultos (Anal Fissure, Adult) Andrew JamesUna fisura anal es un pequeo desgarro o un corte en la piel que rodea el orificio del conducto anal (ano).El sangrado proveniente del desgarro o el corte suele detenerse solo despus de algunos minutos. Es probable que note Psychologist, educationalsangre en la materia fecal, hasta tanto el desgarro o el corte cicatricen. CUIDADOS EN EL HOGAR Comida y bebida  No consuma bananas ni productos lcteos, ya que estos alimentos pueden causar estreimiento.  Beba suficiente lquido para mantener el pis (orina) claro o de color amarillo plido.  Consuma gran cantidad de frutas, cereales integrales y verduras. Instrucciones generales  Mantenga la zona anal tan limpia y seca como sea posible.  Dese un bao de agua tibia (bao de asiento) como se lo haya indicado el mdico. No use jabn para limpiar la zona afectada.  Tome los medicamentos de venta libre y los recetados solamente como se lo haya indicado el mdico.  Use cremas o ungentos solamente como se lo haya indicado el mdico.  Concurra a todas las visitas de control como se lo haya indicado el mdico. Esto es importante. SOLICITE AYUDA SI:  Aumenta el sangrado.  Tiene fiebre.  Tiene heces acuosas (diarrea) mezcladas con sangre.  Siente dolor.  El problema Virginiaempeora, en lugar de mejorar.   Esta informacin no tiene Theme park managercomo fin reemplazar el consejo del mdico. Asegrese de hacerle al mdico cualquier pregunta que tenga.   Document Released: 01/23/2011 Document Revised: 02/15/2015 Elsevier Interactive Patient Education [employer]2016 Elsevier Inc.

## 2015-10-17 NOTE — Assessment & Plan Note (Signed)
Suspect small fissure given history of anal itching  Plan: Monitor for recurrence anusol-HC for prn use  Avoid constipation Information provided

## 2015-10-17 NOTE — Progress Notes (Signed)
   Subjective:  Patient ID: Andrew Sandoval, male    DOB: 09/16/1980  Age: 35 y.o. MRN: [id number]  CC: Rectal Bleeding   HPI Andrew Sandoval presents for   1. Anal bleeding: 2 weeks ago. 4 episodes over the course of one day. He had painful bright red blood per rectum that he noticed after BM in stool and on tissue. Also has anal itching. No associated abdominal pain. No history of rectal bleeding.   Social History  Substance Use Topics  . Smoking status: Never Smoker   . Smokeless tobacco: Not on file  . Alcohol Use: No   Outpatient Prescriptions Prior to Visit  Medication Sig Dispense Refill  . fluticasone (FLONASE) 50 MCG/ACT nasal spray Place 2 sprays into both nostrils daily. (Patient not taking: Reported on 10/17/2015) 16 g 2  . ketoconazole (NIZORAL) 2 % cream Apply 1 application topically daily. (Patient not taking: Reported on 10/17/2015) 60 g 2   No facility-administered medications prior to visit.    ROS Review of Systems  Constitutional: Negative for fever, chills, fatigue and unexpected weight change.  Eyes: Negative for visual disturbance.  Respiratory: Negative for cough and shortness of breath.   Cardiovascular: Negative for chest pain, palpitations and leg swelling.  Gastrointestinal: Positive for blood in stool, anal bleeding and rectal pain. Negative for nausea, vomiting, abdominal pain, diarrhea, constipation and abdominal distention.  Endocrine: Negative for polydipsia, polyphagia and polyuria.  Musculoskeletal: Negative for myalgias, back pain, arthralgias, gait problem and neck pain.  Skin: Negative for rash.  Allergic/Immunologic: Negative for immunocompromised state.  Hematological: Negative for adenopathy. Does not bruise/bleed easily.  Psychiatric/Behavioral: Negative for suicidal ideas, sleep disturbance and dysphoric mood. The patient is not nervous/anxious.     Objective:  BP 114/70 mmHg  Pulse 53  Temp(Src) 98.3 F (36.8 C) (Oral)  Resp  16  Ht 5' 2.5" (1.588 m)  Wt 153 lb (69.4 kg)  BMI 27.52 kg/m2  SpO2 98%  BP/Weight 10/17/2015 05/16/2015 01/18/2015  Systolic BP 114 127 118  Diastolic BP 70 78 70  Wt. (Lbs) 153 155 152  BMI 27.52 27.88 27.34   Physical Exam  Constitutional: He is oriented to person, place, and time. He appears well-developed and well-nourished.  Abdominal: Soft. He exhibits no distension and no mass. There is no tenderness. There is no rebound and no guarding.  Genitourinary: Rectum normal. Rectal exam shows no external hemorrhoid, no internal hemorrhoid, no fissure, no mass, no tenderness and anal tone normal. Guaiac negative stool.  Neurological: He is alert and oriented to person, place, and time.     Assessment & Plan:   Andrew Sandoval was seen today for rectal bleeding.  Diagnoses and all orders for this visit:  Anal itch -     hydrocortisone-pramoxine (ANALPRAM HC) 2.5-1 % rectal cream; Place 1 application rectally 3 (three) times daily. -     Hemoccult - 1 Card (office)  Rectal bleeding -     Hemoccult - 1 Card (office)     Meds ordered this encounter  Medications  . hydrocortisone-pramoxine (ANALPRAM HC) 2.5-1 % rectal cream    Sig: Place 1 application rectally 3 (three) times daily.    Dispense:  30 g    Refill:  0    Follow-up: No Follow-up on file.   Dessa PhiJosalyn Amiree No MD

## 2015-10-17 NOTE — Progress Notes (Signed)
Blood in stool. Two weeks ago  No blood recently  No pain today No tobacco user  No suicidal thoughts in the past two weeks

## 2015-11-02 ENCOUNTER — Telehealth: Payer: Self-pay | Admitting: Family Medicine

## 2015-11-02 NOTE — Telephone Encounter (Signed)
Patient would like to speak with the nurse. °Please follow up. ° °

## 2015-11-08 NOTE — Telephone Encounter (Signed)
Pt requesting HIV form 2016 copy  Advised to return to clinic to sign medical record request  Pt verbalized understanding

## 2016-05-10 ENCOUNTER — Ambulatory Visit: Payer: Self-pay | Attending: Family Medicine

## 2016-05-30 ENCOUNTER — Other Ambulatory Visit: Payer: Self-pay

## 2016-05-30 ENCOUNTER — Ambulatory Visit: Payer: Self-pay | Attending: Family Medicine | Admitting: Family Medicine

## 2016-05-30 ENCOUNTER — Encounter: Payer: Self-pay | Admitting: Family Medicine

## 2016-05-30 VITALS — BP 113/68 | HR 57 | Temp 98.3°F | Ht 62.5 in | Wt 147.6 lb

## 2016-05-30 DIAGNOSIS — R531 Weakness: Secondary | ICD-10-CM | POA: Insufficient documentation

## 2016-05-30 DIAGNOSIS — R0602 Shortness of breath: Secondary | ICD-10-CM | POA: Insufficient documentation

## 2016-05-30 DIAGNOSIS — R42 Dizziness and giddiness: Secondary | ICD-10-CM | POA: Insufficient documentation

## 2016-05-30 DIAGNOSIS — L739 Follicular disorder, unspecified: Secondary | ICD-10-CM | POA: Insufficient documentation

## 2016-05-30 DIAGNOSIS — R000 Tachycardia, unspecified: Secondary | ICD-10-CM | POA: Insufficient documentation

## 2016-05-30 DIAGNOSIS — Z79899 Other long term (current) drug therapy: Secondary | ICD-10-CM | POA: Insufficient documentation

## 2016-05-30 DIAGNOSIS — R079 Chest pain, unspecified: Secondary | ICD-10-CM | POA: Insufficient documentation

## 2016-05-30 LAB — CBC
HCT: 47.9 % (ref 38.5–50.0)
HEMOGLOBIN: 16.5 g/dL (ref 13.2–17.1)
MCH: 31.7 pg (ref 27.0–33.0)
MCHC: 34.4 g/dL (ref 32.0–36.0)
MCV: 91.9 fL (ref 80.0–100.0)
MPV: 9.3 fL (ref 7.5–12.5)
Platelets: 300 10*3/uL (ref 140–400)
RBC: 5.21 MIL/uL (ref 4.20–5.80)
RDW: 13.4 % (ref 11.0–15.0)
WBC: 6.6 10*3/uL (ref 3.8–10.8)

## 2016-05-30 LAB — COMPLETE METABOLIC PANEL WITH GFR
ALT: 34 U/L (ref 9–46)
AST: 26 U/L (ref 10–40)
Albumin: 4.1 g/dL (ref 3.6–5.1)
Alkaline Phosphatase: 79 U/L (ref 40–115)
BILIRUBIN TOTAL: 0.8 mg/dL (ref 0.2–1.2)
BUN: 21 mg/dL (ref 7–25)
CO2: 22 mmol/L (ref 20–31)
CREATININE: 1.05 mg/dL (ref 0.60–1.35)
Calcium: 9.2 mg/dL (ref 8.6–10.3)
Chloride: 103 mmol/L (ref 98–110)
GFR, Est African American: >89 mL/min (ref >=60)
GLUCOSE: 81 mg/dL (ref 65–99)
Potassium: 4.1 mmol/L (ref 3.5–5.3)
SODIUM: 139 mmol/L (ref 135–146)
TOTAL PROTEIN: 7.2 g/dL (ref 6.1–8.1)

## 2016-05-30 LAB — TSH: TSH: 1.25 m[IU]/L (ref 0.40–4.50)

## 2016-05-30 MED ORDER — DICLOXACILLIN SODIUM 250 MG PO CAPS: 250.0000 mg | ORAL_CAPSULE | Freq: Four times a day (QID) | ORAL | 0 refills | Status: DC

## 2016-05-30 MED ORDER — CLINDAMYCIN PHOSPHATE 1 % EX GEL: Freq: Two times a day (BID) | CUTANEOUS | 0 refills | Status: DC

## 2016-05-30 MED ORDER — MUPIROCIN CALCIUM 2 % EX CREA: 1.0000 "application " | TOPICAL_CREAM | Freq: Two times a day (BID) | CUTANEOUS | 0 refills | Status: AC

## 2016-05-30 NOTE — Assessment & Plan Note (Addendum)
Mild folliculitis in scalp Plan: Dicloxacillin x 7 days  Opted for oral therapy because topical clindamycin is not readily available

## 2016-05-30 NOTE — Progress Notes (Signed)
Subjective:  Patient ID: Andrew Sandoval, male    DOB: 02/12/1981  Age: 35 y.o. MRN: [id number]  CC: Chest Pain   HPI Andrew Sandoval presents for   1. Chest pains: left sided. Started 3 years ago. Pain comes and goes. Occurs once per week. Occurs at work. Unable to explain type of pain. Pain is  associated with fatigue, weakness, feeling like he cannot walk. Also feels dizzy, rapid heart beat, shortness of breath. Symptoms last for 2-3 seconds. No associated nausea. Last episode of this pain occurred 15 days ago. He has no history of high BP and denies ETOH.   2. Scalp lesions: posterior scalp. Noted by Paediatric nursebarber. Advised to have it checked. No pain, itching or swelling.   Social History  Substance Use Topics  . Smoking status: Never Smoker  . Smokeless tobacco: Not on file  . Alcohol use No    Outpatient Medications Prior to Visit  Medication Sig Dispense Refill  . hydrocortisone-pramoxine (ANALPRAM HC) 2.5-1 % rectal cream Place 1 application rectally 3 (three) times daily. (Patient not taking: Reported on 05/30/2016) 30 g 0   No facility-administered medications prior to visit.     ROS Review of Systems  Constitutional: Negative for chills, fatigue, fever and unexpected weight change.  Eyes: Negative for visual disturbance.  Respiratory: Positive for shortness of breath. Negative for cough.   Cardiovascular: Positive for chest pain and palpitations. Negative for leg swelling.  Gastrointestinal: Negative for abdominal pain, blood in stool, constipation, diarrhea, nausea and vomiting.  Endocrine: Negative for polydipsia, polyphagia and polyuria.  Musculoskeletal: Negative for arthralgias, back pain, gait problem, myalgias and neck pain.  Skin: Negative for rash.  Allergic/Immunologic: Negative for immunocompromised state.  Neurological: Positive for dizziness and weakness.  Hematological: Negative for adenopathy. Does not bruise/bleed easily.  Psychiatric/Behavioral:  Negative for dysphoric mood, sleep disturbance and suicidal ideas. The patient is not nervous/anxious.     Objective:  BP 113/68 (BP Location: Left Arm, Patient Position: Sitting, Cuff Size: Small)   Pulse (!) 57   Temp 98.3 F (36.8 C) (Oral)   Ht 5' 2.5" (1.588 m)   Wt 147 lb 9.6 oz (67 kg)   SpO2 98%   BMI 26.57 kg/m   BP/Weight 05/30/2016 10/17/2015 05/16/2015  Systolic BP 113 114 127  Diastolic BP 68 70 78  Wt. (Lbs) 147.6 153 155  BMI 26.57 27.52 27.88   Wt Readings from Last 3 Encounters:  05/30/16 147 lb 9.6 oz (67 kg)  10/17/15 153 lb (69.4 kg)  05/16/15 155 lb (70.3 kg)     Physical Exam  Constitutional: He appears well-developed and well-nourished. No distress.  HENT:  Head: Normocephalic and atraumatic.    Neck: Normal range of motion. Neck supple.  Cardiovascular: Normal rate, regular rhythm, normal heart sounds and intact distal pulses.   Pulmonary/Chest: Effort normal and breath sounds normal.  Musculoskeletal: He exhibits no edema.  Neurological: He is alert.  Skin: Skin is warm and dry. No rash noted. No erythema.  Psychiatric: He has a normal mood and affect.   EKG: normal EKG, normal sinus rhythm.  Assessment & Plan:  Andrew Sandoval was seen today for chest pain.  Diagnoses and all orders for this visit:  Chest pain, unspecified type -     EKG 12-Lead -     CBC -     TSH -     Ambulatory referral to Cardiology -     COMPLETE METABOLIC PANEL WITH GFR  Folliculitis -  dicloxacillin (DYNAPEN) 250 MG capsule; Take 1 capsule (250 mg total) by mouth 4 (four) times daily.   There are no diagnoses linked to this encounter.  No orders of the defined types were placed in this encounter.   Follow-up: Return in about 6 weeks (around 07/11/2016) for chest pain .   Dessa PhiJosalyn Brynnlee Cumpian MD

## 2016-05-30 NOTE — Progress Notes (Signed)
Pt is here today for pains in his chest near his heart. Pt states that he has been having the pain for 2 months now.  Pt is getting flu shot today.

## 2016-05-30 NOTE — Addendum Note (Signed)
Addended by: Dessa PhiFUNCHES, Keaja Reaume on: 05/30/2016 03:55 PM   Modules accepted: Orders

## 2016-05-30 NOTE — Patient Instructions (Addendum)
Andrew Sandoval was seen today for chest pain.  Diagnoses and all orders for this visit:  Chest pain, unspecified type -     EKG 12-Lead -     CBC -     TSH -     Ambulatory referral to Cardiology -     COMPLETE METABOLIC PANEL WITH GFR  Folliculitis -     Discontinue: dicloxacillin (DYNAPEN) 250 MG capsule; Take 1 capsule (250 mg total) by mouth 4 (four) times daily. -     clindamycin (CLINDAMAX) 1 % gel; Apply topically 2 (two) times daily. For 7 days -     mupirocin cream (BACTROBAN) 2 %; Apply 1 application topically 2 (two) times daily. To scalp for 7 days    F/u in 6 weeks for chest pains and recheck scalp   Dr. Armen PickupFunches

## 2016-05-30 NOTE — Assessment & Plan Note (Signed)
Intermittent chest pains for one year suspect SVT given patient description of symptoms Plan: EKG CBC, CMP, TSH Advised alcohol avoidance Cardiology referral for Holter Monitor

## 2016-05-31 ENCOUNTER — Ambulatory Visit: Payer: Self-pay

## 2016-05-31 MED ORDER — CLINDAMYCIN PHOSPHATE 1 % EX SOLN: Freq: Two times a day (BID) | CUTANEOUS | 3 refills | Status: DC

## 2016-05-31 MED ORDER — MUPIROCIN 2 % EX OINT: 1.0000 "application " | TOPICAL_OINTMENT | Freq: Two times a day (BID) | CUTANEOUS | 3 refills | Status: DC

## 2016-05-31 NOTE — Addendum Note (Signed)
Addended by: Dessa PhiFUNCHES, Layne Dilauro on: 05/31/2016 11:27 AM   Modules accepted: Orders

## 2016-06-05 ENCOUNTER — Telehealth: Payer: Self-pay

## 2016-06-05 NOTE — Telephone Encounter (Signed)
Pt was called([phone]) and informed of lab results.

## 2016-06-21 NOTE — Progress Notes (Signed)
     HPI: 36 year old male for evaluation of chest pain. Laboratories December 2017 showed normal liver functions, hemoglobin and TSH. Patient states that 3 weeks ago he had 3 separate episodes of chest pain. They lasted for 1-2 seconds each. No radiation. No associated symptoms. He does not have exertional chest pain. There is no dyspnea on exertion, orthopnea, PND, pedal edema or syncope.  Current Outpatient Prescriptions  Medication Sig Dispense Refill  . clindamycin (CLEOCIN-T) 1 % external solution Apply topically 2 (two) times daily. 30 mL 3  . mupirocin ointment (BACTROBAN) 2 % Apply 1 application topically 2 (two) times daily. 30 g 3   No current facility-administered medications for this visit.     Allergies  Allergen Reactions  . Tylenol [Acetaminophen] Hives    Past Medical History:  Diagnosis Date  . Cocaine use 2006-2010    No past surgical history on file.  Social History   Social History  . Marital status: Single    Spouse name: N/A  . Number of children: N/A  . Years of education: N/A   Occupational History  . Not on file.   Social History Main Topics  . Smoking status: Never Smoker  . Smokeless tobacco: Not on file  . Alcohol use No  . Drug use: No  . Sexual activity: Not on file   Other Topics Concern  . Not on file   Social History Narrative  . No narrative on file    Family History  Problem Relation Age of Onset  . Diabetes Neg Hx     ROS: no fevers or chills, productive cough, hemoptysis, dysphasia, odynophagia, melena, hematochezia, dysuria, hematuria, rash, seizure activity, orthopnea, PND, pedal edema, claudication. Remaining systems are negative.  Physical Exam:   There were no vitals taken for this visit.  General:  Well developed/well nourished in NAD Skin warm/dry Patient not depressed No peripheral clubbing Back-normal HEENT-normal/normal eyelids Neck supple/normal carotid upstroke bilaterally; no JVD; no  thyromegaly chest - CTA/ normal expansion CV - RRR/normal S1 and S2; no murmurs, rubs or gallops;  PMI nondisplaced Abdomen -NT/ND, no HSM, no mass, + bowel sounds, no bruit 2+ femoral pulses, no bruits Ext-no edema, chords, 2+ DP Neuro-grossly nonfocal  ECG 05/30/2016-sinus rhythm with no ST changes.  ECG today-Sinus rhythm with no ST changes.  A/P  1 Chest pain-symptoms extremely atypical and do not sound cardiac. He does not have exertional chest pain and his episodes lasted for 1-2 seconds. I would not pursue further ischemia evaluation. Note his electrocardiogram shows no ST changes. He will follow up with primary care for further workup of noncardiac chest pain.   Olga MillersBrian Emmons Toth, MD

## 2016-07-01 ENCOUNTER — Ambulatory Visit (INDEPENDENT_AMBULATORY_CARE_PROVIDER_SITE_OTHER): Payer: Self-pay | Admitting: Cardiology

## 2016-07-01 ENCOUNTER — Encounter: Payer: Self-pay | Admitting: Cardiology

## 2016-07-01 VITALS — BP 130/78 | HR 81 | Ht 62.0 in | Wt 151.0 lb

## 2016-07-01 DIAGNOSIS — R072 Precordial pain: Secondary | ICD-10-CM

## 2016-07-01 NOTE — Patient Instructions (Signed)
Your physician recommends that you schedule a follow-up appointment in: as needed  

## 2016-07-22 ENCOUNTER — Ambulatory Visit: Payer: Self-pay | Attending: Family Medicine | Admitting: Family Medicine

## 2016-07-22 ENCOUNTER — Encounter: Payer: Self-pay | Admitting: Family Medicine

## 2016-07-22 VITALS — BP 113/72 | HR 64 | Temp 98.2°F | Ht 62.0 in | Wt 154.8 lb

## 2016-07-22 DIAGNOSIS — A048 Other specified bacterial intestinal infections: Secondary | ICD-10-CM

## 2016-07-22 DIAGNOSIS — B9681 Helicobacter pylori [H. pylori] as the cause of diseases classified elsewhere: Secondary | ICD-10-CM | POA: Insufficient documentation

## 2016-07-22 DIAGNOSIS — L739 Follicular disorder, unspecified: Secondary | ICD-10-CM

## 2016-07-22 DIAGNOSIS — R079 Chest pain, unspecified: Secondary | ICD-10-CM

## 2016-07-22 DIAGNOSIS — R42 Dizziness and giddiness: Secondary | ICD-10-CM | POA: Insufficient documentation

## 2016-07-22 DIAGNOSIS — R0602 Shortness of breath: Secondary | ICD-10-CM | POA: Insufficient documentation

## 2016-07-22 DIAGNOSIS — R000 Tachycardia, unspecified: Secondary | ICD-10-CM | POA: Insufficient documentation

## 2016-07-22 DIAGNOSIS — R531 Weakness: Secondary | ICD-10-CM | POA: Insufficient documentation

## 2016-07-22 DIAGNOSIS — Z87891 Personal history of nicotine dependence: Secondary | ICD-10-CM | POA: Insufficient documentation

## 2016-07-22 NOTE — Assessment & Plan Note (Signed)
Resolved with treatment 

## 2016-07-22 NOTE — Progress Notes (Signed)
Subjective:  Patient ID: Andrew Sandoval, male    DOB: 06/22/1980  Age: 36 y.o. MRN: [id number]  CC: Chest Pain   HPI Andrew Sandoval presents for   1. Chest pains: left sided. Started 3 years ago. Pain comes and goes. Occurs once per week. Occurs at work. Unable to explain type of pain. Pain is  associated with fatigue, weakness, feeling like he cannot walk. Also feels dizzy, rapid heart beat, shortness of breath. Symptoms last for 2-3 seconds. No associated nausea. Last episode of this pain occurred 15 days ago. He has no history of high BP and denies ETOH.    Since our last visit he has seen cardiology who reviewed patient records and did not suspect cardiac chest pain. No further cardiac evaluation was done. Patient reports 3 more episodes of similar pain since our last visits. He denies reflux, cough, abdominal pain. No recent heavy lifting. He admits that eating beef does exacerbate his pain.   2. Scalp lesions: healed with antibiotics.   Social History  Substance Use Topics  . Smoking status: Former Games developermoker  . Smokeless tobacco: Never Used  . Alcohol use No    No outpatient prescriptions prior to visit.   No facility-administered medications prior to visit.     ROS Review of Systems  Constitutional: Negative for chills, fatigue, fever and unexpected weight change.  Eyes: Negative for visual disturbance.  Respiratory: Positive for shortness of breath. Negative for cough.   Cardiovascular: Positive for chest pain and palpitations. Negative for leg swelling.  Gastrointestinal: Negative for abdominal pain, blood in stool, constipation, diarrhea, nausea and vomiting.  Endocrine: Negative for polydipsia, polyphagia and polyuria.  Musculoskeletal: Negative for arthralgias, back pain, gait problem, myalgias and neck pain.  Skin: Negative for rash.  Allergic/Immunologic: Negative for immunocompromised state.  Neurological: Positive for dizziness, weakness and numbness  (numbness in R arm and leg ).  Hematological: Negative for adenopathy. Does not bruise/bleed easily.  Psychiatric/Behavioral: Negative for dysphoric mood, sleep disturbance and suicidal ideas. The patient is not nervous/anxious.     Objective:  BP 113/72 (BP Location: Left Arm, Patient Position: Sitting, Cuff Size: Small)   Pulse (!) 58   Temp 98.2 F (36.8 C) (Oral)   Ht 5\' 2"  (1.575 m)   Wt 154 lb 12.8 oz (70.2 kg)   SpO2 98%   BMI 28.31 kg/m   BP/Weight 07/22/2016 07/01/2016 05/30/2016  Systolic BP 113 130 113  Diastolic BP 72 78 68  Wt. (Lbs) 154.8 151 147.6  BMI 28.31 27.62 26.57   Wt Readings from Last 3 Encounters:  07/22/16 154 lb 12.8 oz (70.2 kg)  07/01/16 151 lb (68.5 kg)  05/30/16 147 lb 9.6 oz (67 kg)   Pulse Readings from Last 3 Encounters:  07/22/16 64  07/01/16 81  05/30/16 (!) 57    Physical Exam  Constitutional: He appears well-developed and well-nourished. No distress.  HENT:  Head: Normocephalic and atraumatic.    Neck: Normal range of motion. Neck supple.  Cardiovascular: Normal rate, regular rhythm, normal heart sounds and intact distal pulses.   Pulmonary/Chest: Effort normal and breath sounds normal.  Musculoskeletal: He exhibits no edema.  Neurological: He is alert.  Skin: Skin is warm and dry. No rash noted. No erythema.  Psychiatric: He has a normal mood and affect.   EKG: normal EKG, normal sinus rhythm.  Assessment & Plan:  Andrew QuickJose was seen today for chest pain.  Diagnoses and all orders for this visit:  Chest  pain, unspecified type -     H. pylori breath test  Folliculitis   There are no diagnoses linked to this encounter.  No orders of the defined types were placed in this encounter.   Follow-up: Return in about 3 months (around 10/19/2016) for chest pains .   Dessa Phi MD

## 2016-07-22 NOTE — Progress Notes (Signed)
Pt is here today to follow up on chest pains. Pt states that he is still having a little pain. Pt states that his right arm gets numb at times.

## 2016-07-22 NOTE — Patient Instructions (Addendum)
Andrew Sandoval was seen today for chest pain.  Diagnoses and all orders for this visit:  Chest pain, unspecified type -     H. pylori breath test  Folliculitis   You will be called with results If bacteria is found it will be treated  Decrease intake of red meat and carbohydrates (rice, bread, corn) Eat more chicken, fish, shellfish, vegetables, fruits, beans   Also regular exercise is important for health.   F/u in  3 months sooner if needed   Dr. Armen PickupFunches

## 2016-07-22 NOTE — Assessment & Plan Note (Signed)
Atypical chest pains Patient does exhibit excessive worry about his health  Plan: Check H. Pylori to rule out infectious gastritis Patient declined PPI trial Reviewed lifestyle habits for heart health.

## 2016-07-23 ENCOUNTER — Telehealth: Payer: Self-pay

## 2016-07-23 DIAGNOSIS — A048 Other specified bacterial intestinal infections: Secondary | ICD-10-CM | POA: Insufficient documentation

## 2016-07-23 LAB — H. PYLORI BREATH TEST: H. pylori Breath Test: DETECTED — AB

## 2016-07-23 MED ORDER — CLARITHROMYCIN 250 MG PO TABS: 500.0000 mg | ORAL_TABLET | Freq: Two times a day (BID) | ORAL | 0 refills | Status: DC

## 2016-07-23 MED ORDER — OMEPRAZOLE 20 MG PO CPDR: 20.0000 mg | DELAYED_RELEASE_CAPSULE | Freq: Two times a day (BID) | ORAL | 0 refills | Status: DC

## 2016-07-23 MED ORDER — AMOXICILLIN 500 MG PO CAPS: 1000.0000 mg | ORAL_CAPSULE | Freq: Two times a day (BID) | ORAL | 0 refills | Status: DC

## 2016-07-23 MED FILL — ?OMEPRAZOLE DR 20 MG CAPSUL: 20 | 14 days supply | Qty: 28 | Fill #0

## 2016-07-23 MED FILL — CLARITHROMYCIN 500 MG TAB: 500 | 14 days supply | Qty: 28 | Fill #0

## 2016-07-23 MED FILL — ?AMOXICILLIN 500 MG CAPSULE: 500 | 14 days supply | Qty: 56 | Fill #0

## 2016-07-23 NOTE — Telephone Encounter (Signed)
CMA call to go over H pylori results  Patient Verify DOB  Patient was aware and understood his results   Patient also was aware that he has medications to pick up at the pharmacy

## 2016-07-23 NOTE — Assessment & Plan Note (Signed)
H. Pylori is positive H. Pylori is a bacteria that can cause gastritis which results in pains in upper abdomen or chest I do recommend treating with antibiotics and antacid medication for 14 days to get rid of the bacteria Omeprazole 20 mg twice daily for 14 days, PPI Clarithromycin 500 mg twice daily for 14 days Amoxicillin 1 gram twice daily for 14 days

## 2016-07-23 NOTE — Telephone Encounter (Signed)
-----   Message from Dessa PhiJosalyn Funches, MD sent at 07/23/2016 12:28 PM EST ----- H. Pylori is positive H. Pylori is a bacteria that can cause gastritis which results in pains in upper abdomen or chest I do recommend treating with antibiotics and antacid medication for 14 days to get rid of the bacteria Omeprazole 20 mg twice daily for 14 days, PPI Clarithromycin 500 mg twice daily for 14 days Amoxicillin 1 gram twice daily for 14 days

## 2016-07-23 NOTE — Addendum Note (Signed)
Addended by: Dessa PhiFUNCHES, Teandre Hamre on: 07/23/2016 12:31 PM   Modules accepted: Orders

## 2016-08-01 ENCOUNTER — Encounter: Payer: Self-pay | Admitting: Physician Assistant

## 2016-08-01 ENCOUNTER — Ambulatory Visit: Payer: Self-pay | Attending: Physician Assistant | Admitting: Physician Assistant

## 2016-08-01 VITALS — BP 118/79 | HR 61 | Temp 97.6°F | Resp 16 | Ht 59.06 in | Wt 160.0 lb

## 2016-08-01 DIAGNOSIS — Z79899 Other long term (current) drug therapy: Secondary | ICD-10-CM | POA: Insufficient documentation

## 2016-08-01 DIAGNOSIS — T887XXA Unspecified adverse effect of drug or medicament, initial encounter: Secondary | ICD-10-CM

## 2016-08-01 DIAGNOSIS — A048 Other specified bacterial intestinal infections: Secondary | ICD-10-CM

## 2016-08-01 DIAGNOSIS — R42 Dizziness and giddiness: Secondary | ICD-10-CM | POA: Insufficient documentation

## 2016-08-01 DIAGNOSIS — B9681 Helicobacter pylori [H. pylori] as the cause of diseases classified elsewhere: Secondary | ICD-10-CM | POA: Insufficient documentation

## 2016-08-01 DIAGNOSIS — R112 Nausea with vomiting, unspecified: Secondary | ICD-10-CM | POA: Insufficient documentation

## 2016-08-01 DIAGNOSIS — T50905A Adverse effect of unspecified drugs, medicaments and biological substances, initial encounter: Secondary | ICD-10-CM

## 2016-08-01 NOTE — Patient Instructions (Addendum)
Pare de tomar todas las medicinas. Por favor regrese en 14 dias para confirmar que se le mato la bacteria del [place].   Mareos (Dizziness) Los mareos son un problema muy frecuente. Se trata de una sensacin de inestabilidad o de desvanecimiento. Puede sentir que se va a desmayar. Un mareo puede provocarle una lesin si se tropieza o se cae. Las Dealerpersonas de todas las edades pueden sufrir Research scientist (life sciences)mareos, Biomedical engineerpero es ms frecuente en los adultos Clydemayores. Esta afeccin puede tener muchas causas, entre las que se pueden Assurantmencionar los medicamentos, la deshidratacin y [place] enfermedades. INSTRUCCIONES PARA EL CUIDADO EN EL HOGAR Estas indicaciones pueden ayudarlo con el trastorno: Comida y bebida   Beba suficiente lquido para Pharmacologistmantener la orina clara o de color amarillo plido. Esto evita la deshidratacin. Trate de beber ms lquidos transparentes, como agua.  No beba alcohol.  Limite el consumo de cafena si el mdico se lo indica.  Limite el consumo de sal si el mdico se lo indica. Actividad   Evite los movimientos rpidos.  Levntese de las sillas con lentitud y apyese hasta sentirse bien.  Por la maana, sintese primero a un lado de la cama. Cuando se sienta bien, pngase lentamente de [address]pie mientras se sostiene de algo, hasta que sepa que ha logrado el equilibrio.  Mueva las piernas con frecuencia si debe estar de pie en un lugar durante mucho tiempo. Mientras est de pie, contraiga y relaje los msculos de las piernas.  No conduzca vehculos ni opere maquinaria pesada si se siente mareado.  Evite agacharse si se siente mareado. En su casa, coloque los objetos de modo que le resulte fcil alcanzarlos sin Public librarianagacharse. Estilo de vida   No consuma ningn producto que contenga tabaco, lo que incluye cigarrillos, tabaco de Theatre managermascar o Administrator, Civil Servicecigarrillos electrnicos. Si necesita ayuda para dejar de fumar, consulte al American Expressmdico.  Trate de reducir el nivel de estrs practicando actividades como el yoga o la  meditacin. Hable con el mdico si necesita ayuda. Instrucciones generales   Controle sus mareos para ver si hay cambios.  Tome los medicamentos solamente como se lo haya indicado el mdico. Hable con el mdico si cree que los medicamentos que est tomando son la causa de sus mareos.  Infrmele a un amigo o a un familiar si se siente mareado. Pdale a esta persona que llame al mdico si observa cambios en su comportamiento.  Concurra a todas las visitas de control como se lo haya indicado el mdico. Esto es importante. SOLICITE ATENCIN MDICA SI:  Los American Expressmareos persisten.  Los Golden West Financialmareos o la sensacin de Production assistant, radiodesvanecimiento empeoran.  Siente nuseas.  Ha perdido la audicin.  Aparecen nuevos sntomas.  Cuando est de pie se siente inestable o que la habitacin da vueltas. SOLICITE ATENCIN MDICA DE INMEDIATO SI:  Vomita o tiene diarrea y no puede comer ni beber nada.  Tiene dificultad para hablar, para caminar, para tragar o para Boeingusar los brazos, las manos o las piernas.  Siente una debilidad generalizada.  No piensa con claridad o tiene dificultades para armar oraciones. Es posible que un amigo o un familiar adviertan que esto ocurre.  Tiene dolor de pecho, dolor abdominal, sudoracin o Company secretaryle falta el aire.  Hay cambios en la visin.  Observa un sangrado.  Tiene dolores de Turkmenistancabeza.  Tiene dolor o rigidez en el cuello.  Tiene fiebre. Esta informacin no tiene Theme park managercomo fin reemplazar el consejo del mdico. Asegrese de hacerle al mdico cualquier pregunta que tenga. Document Released: 05/27/2005 Document Revised:  10/11/2014 Document Reviewed: 05/23/2014 Elsevier Interactive Patient Education  2017 [employer]

## 2016-08-01 NOTE — Progress Notes (Signed)
   Subjective:  Patient ID: Andrew Sandoval, male    DOB: 01/21/1981  Age: 36 y.o. MRN: [id number]  CC:  Dizziness and nausea  HPI Andrew Sandoval is a 36 y.o. male with a PMH of cocaine use presents with dizziness associated to taking amoxicillin, clarithromycin, and omeprazole. Does not know which one may be causing his dizziness. No symptoms before starting medication. He's been using his medications for H. pylori eradication. Has thus far taken 8 days worth of medicine. Denies epigastric pain, hematemesis, hematochezia, diarrhea, rash, or headache.     Outpatient Medications Prior to Visit  Medication Sig Dispense Refill  . omeprazole (PRILOSEC) 20 MG capsule Take 1 capsule (20 mg total) by mouth 2 (two) times daily before a meal. 28 capsule 0  . amoxicillin (AMOXIL) 500 MG capsule Take 2 capsules (1,000 mg total) by mouth 2 (two) times daily. 56 capsule 0  . clarithromycin (BIAXIN) 250 MG tablet Take 2 tablets (500 mg total) by mouth 2 (two) times daily. 56 tablet 0   No facility-administered medications prior to visit.      ROS Review of Systems  Constitutional: Negative for chills, fever and malaise/fatigue.  Eyes: Negative for blurred vision.  Respiratory: Negative for shortness of breath.   Cardiovascular: Negative for chest pain and palpitations.  Gastrointestinal: Positive for nausea and vomiting. Negative for abdominal pain, blood in stool and diarrhea.  Genitourinary: Negative for dysuria and hematuria.  Musculoskeletal: Negative for joint pain and myalgias.  Skin: Negative for rash.  Neurological: Negative for tingling and headaches.  Psychiatric/Behavioral: Negative for depression. The patient is not nervous/anxious.     Objective:  BP 118/79 (BP Location: Left Arm, Patient Position: Sitting, Cuff Size: Normal)   Pulse 61   Temp 97.6 F (36.4 C) (Oral)   Resp 16   Ht 4' 11.06" (1.5 m)   Wt 160 lb (72.6 kg)   SpO2 98%   BMI 32.26 kg/m   BP/Weight  08/01/2016 07/22/2016 07/01/2016  Systolic BP 118 113 130  Diastolic BP 79 72 78  Wt. (Lbs) 160 154.8 151  BMI 32.26 28.31 27.62      Physical Exam  Constitutional: He is oriented to person, place, and time.  Well developed, well nourished, NAD, polite  HENT:  Head: Normocephalic and atraumatic.  Eyes: No scleral icterus.  Neck: Normal range of motion. Neck supple. No thyromegaly present.  Cardiovascular: Normal rate, regular rhythm and normal heart sounds.   Pulmonary/Chest: Effort normal and breath sounds normal.  Abdominal: Soft. Bowel sounds are normal. There is no tenderness.  Musculoskeletal: He exhibits no edema.  Neurological: He is alert and oriented to person, place, and time.  Skin: Skin is warm and dry. No rash noted. No erythema. No pallor.  Psychiatric: He has a normal mood and affect. His behavior is normal. Thought content normal.  Vitals reviewed.    Assessment & Plan:   1. H. pylori infection - H. pylori breath test; Future. Advised to return in 14 days. Advised to stop taking omeprazole.  2. Adverse effect of drug, initial encounter - Nausea, vomiting, and dizziness likely attributed to clarithromycin and amoxicillin use.     Follow-up: Return if symptoms worsen or fail to improve.   Loletta Specteroger David Jeffery Bachmeier PA

## 2016-08-09 ENCOUNTER — Ambulatory Visit: Payer: Self-pay | Attending: Family Medicine

## 2016-09-25 ENCOUNTER — Other Ambulatory Visit: Payer: Self-pay

## 2016-09-25 ENCOUNTER — Encounter: Payer: Self-pay | Admitting: Family Medicine

## 2016-09-25 ENCOUNTER — Ambulatory Visit: Payer: Self-pay | Attending: Family Medicine | Admitting: Family Medicine

## 2016-09-25 VITALS — BP 119/76 | HR 49 | Temp 97.9°F | Ht 63.75 in | Wt 158.0 lb

## 2016-09-25 DIAGNOSIS — R0789 Other chest pain: Secondary | ICD-10-CM | POA: Insufficient documentation

## 2016-09-25 DIAGNOSIS — R200 Anesthesia of skin: Secondary | ICD-10-CM | POA: Insufficient documentation

## 2016-09-25 DIAGNOSIS — Z131 Encounter for screening for diabetes mellitus: Secondary | ICD-10-CM | POA: Insufficient documentation

## 2016-09-25 DIAGNOSIS — Z79899 Other long term (current) drug therapy: Secondary | ICD-10-CM | POA: Insufficient documentation

## 2016-09-25 DIAGNOSIS — Z888 Allergy status to other drugs, medicaments and biological substances status: Secondary | ICD-10-CM | POA: Insufficient documentation

## 2016-09-25 DIAGNOSIS — F419 Anxiety disorder, unspecified: Secondary | ICD-10-CM | POA: Insufficient documentation

## 2016-09-25 LAB — POCT GLYCOSYLATED HEMOGLOBIN (HGB A1C): Hemoglobin A1C: 5.4

## 2016-09-25 MED ORDER — ASPIRIN 81 MG PO CHEW: 81.0000 mg | CHEWABLE_TABLET | Freq: Every day | ORAL | 3 refills | Status: DC

## 2016-09-25 MED ORDER — GABAPENTIN 300 MG PO CAPS: 300.0000 mg | ORAL_CAPSULE | Freq: Two times a day (BID) | ORAL | 3 refills | Status: DC

## 2016-09-25 MED ORDER — BUSPIRONE HCL 7.5 MG PO TABS: 7.5000 mg | ORAL_TABLET | Freq: Two times a day (BID) | ORAL | 3 refills | Status: DC | PRN

## 2016-09-25 MED FILL — busPIRone HCL 7.5 MG TABS: 7.5 | 30 days supply | Qty: 60 | Fill #0

## 2016-09-25 MED FILL — GABAPENTIN 300 MG CAPSULE: 300 | 30 days supply | Qty: 60 | Fill #0

## 2016-09-25 NOTE — Progress Notes (Addendum)
Subjective:  Patient ID: Andrew Sandoval, male    DOB: 11/12/1980  Age: 36 y.o. MRN: [id number]  CC: arm numbness (right); leg numbness (right); Chest Pain ("heart hurting"); and Anxiety ("fearful")   HPI Andrew Sandoval presents With right arm and right leg numbness which he informs me he has been on for 2 weeks, is intermittent and lasts 2 minutes at a time. This has occurred 3 times in the last week. He also complains of chest pain which occurs whenever he is worried and has been on for the last 3 months.  Review of his chart indicates this is not new; he was seen by his PCP in 07/2016 for both numbness and chest pain and he has been seen by cardiology for chest pain which was thought to be noncardiac on 07/01/16. His EKG so far been unrevealing. He endorses anxiety which is intermittent but denies suicidal ideation or intent. He denies weakness in any extremity and has no facial or voice changes.  Past Medical History:  Diagnosis Date  . Cocaine use 2006-2010    Past Surgical History:  Procedure Laterality Date  . No prior surgery      Allergies  Allergen Reactions  . Tylenol [Acetaminophen] Hives     Outpatient Medications Prior to Visit  Medication Sig Dispense Refill  . omeprazole (PRILOSEC) 20 MG capsule Take 1 capsule (20 mg total) by mouth 2 (two) times daily before a meal. (Patient not taking: Reported on 09/25/2016) 28 capsule 0   No facility-administered medications prior to visit.     ROS Review of Systems  Constitutional: Negative for activity change and appetite change.  HENT: Negative for sinus pressure and sore throat.   Eyes: Negative for visual disturbance.  Respiratory: Negative for cough, chest tightness and shortness of breath.   Cardiovascular: Negative for chest pain and leg swelling.  Gastrointestinal: Negative for abdominal distention, abdominal pain, constipation and diarrhea.  Endocrine: Negative.   Genitourinary: Negative for dysuria.    Musculoskeletal: Negative for joint swelling and myalgias.  Skin: Negative for rash.  Allergic/Immunologic: Negative.   Neurological: Negative for weakness, light-headedness and numbness.  Psychiatric/Behavioral: Negative for dysphoric mood and suicidal ideas.    Objective:  BP 119/76 (BP Location: Right Arm, Patient Position: Sitting, Cuff Size: Small)   Pulse (!) 49   Temp 97.9 F (36.6 C) (Oral)   Ht 5' 3.75" (1.619 m)   Wt 158 lb (71.7 kg)   SpO2 99%   BMI 27.33 kg/m   BP/Weight 09/25/2016 08/01/2016 07/22/2016  Systolic BP 119 118 113  Diastolic BP 76 79 72  Wt. (Lbs) 158 160 154.8  BMI 27.33 32.26 28.31      Physical Exam  Constitutional: He is oriented to person, place, and time. He appears well-developed and well-nourished.  Cardiovascular: Normal heart sounds and intact distal pulses.  Bradycardia present.   No murmur heard. Pulmonary/Chest: Effort normal and breath sounds normal. He has no wheezes. He has no rales. He exhibits no tenderness.  Abdominal: Soft. Bowel sounds are normal. He exhibits no distension and no mass. There is no tenderness.  Musculoskeletal: Normal range of motion.  Neurological: He is alert and oriented to person, place, and time.  Skin: Skin is warm and dry.  Psychiatric: He has a normal mood and affect.    Lab Results  Component Value Date   HGBA1C 5.4 09/25/2016    CMP Latest Ref Rng & Units 05/30/2016 05/16/2015 04/18/2009  Glucose 65 - 99 mg/dL  81 82 89  BUN 7 - 25 mg/dL Creatinine 0.60 - 1.35 mg/dL 2.13 0.86 0.9  Sodium 578 - 146 mmol/L 139 134(L) 141  Potassium 3.5 - 5.3 mmol/L 4.1 4.1 3.6  Chloride 98 - 110 mmol/L 103 101 105  CO2 20 - 31 mmol/L 22 25 -  Calcium 8.6 - 10.3 mg/dL 9.2 9.0 -  Total Protein 6.1 - 8.1 g/dL 7.2 7.3 -  Total Bilirubin 0.2 - 1.2 mg/dL 0.8 0.9 -  Alkaline Phos 40 - 115 U/L 79 87 -  AST 10 - 40 U/L 26 35 -  ALT 9 - 46 U/L 34 67(H) -     Assessment & Plan:   1. Numbness Intermittent,  unknown etiology We'll screen for diabetes - gabapentin (NEURONTIN) 300 MG capsule; Take 1 capsule (300 mg total) by mouth 2 (two) times daily.  Dispense: 60 capsule; Refill: 3 - aspirin (ASPIRIN 81) 81 MG chewable tablet; Chew 1 tablet (81 mg total) by mouth daily.  Dispense: 30 tablet; Refill: 3  2. Screening for diabetes mellitus   3. Anxiety This could explain his atypical chest pain Placed on BuSpar - busPIRone (BUSPAR) 7.5 MG tablet; Take 1 tablet (7.5 mg total) by mouth 2 (two) times daily as needed.  Dispense: 60 tablet; Refill: 3  4. Atypical chest pain Previously seen by cardiology, chest pain thought to be noncardiac EKG  today reveals early repolarization; not concerning for ischemia   Meds ordered this encounter  Medications  . busPIRone (BUSPAR) 7.5 MG tablet    Sig: Take 1 tablet (7.5 mg total) by mouth 2 (two) times daily as needed.    Dispense:  60 tablet    Refill:  3  . gabapentin (NEURONTIN) 300 MG capsule    Sig: Take 1 capsule (300 mg total) by mouth 2 (two) times daily.    Dispense:  60 capsule    Refill:  3  . aspirin (ASPIRIN 81) 81 MG chewable tablet    Sig: Chew 1 tablet (81 mg total) by mouth daily.    Dispense:  30 tablet    Refill:  3    Follow-up: Return in about 6 weeks (around 11/06/2016) for follow up on anxiety with Dr Armen Pickup.   Jaclyn Shaggy MD

## 2016-10-23 ENCOUNTER — Encounter: Payer: Self-pay | Admitting: Family Medicine

## 2016-11-11 ENCOUNTER — Encounter: Payer: Self-pay | Admitting: Family Medicine

## 2016-11-11 ENCOUNTER — Ambulatory Visit: Payer: Self-pay | Attending: Family Medicine | Admitting: Family Medicine

## 2016-11-11 ENCOUNTER — Ambulatory Visit: Payer: Self-pay

## 2016-11-11 VITALS — BP 117/66 | HR 55 | Temp 98.0°F | Wt 159.4 lb

## 2016-11-11 DIAGNOSIS — R079 Chest pain, unspecified: Secondary | ICD-10-CM

## 2016-11-11 DIAGNOSIS — J351 Hypertrophy of tonsils: Secondary | ICD-10-CM

## 2016-11-11 DIAGNOSIS — Z7982 Long term (current) use of aspirin: Secondary | ICD-10-CM | POA: Insufficient documentation

## 2016-11-11 DIAGNOSIS — B353 Tinea pedis: Secondary | ICD-10-CM

## 2016-11-11 DIAGNOSIS — Z87891 Personal history of nicotine dependence: Secondary | ICD-10-CM | POA: Insufficient documentation

## 2016-11-11 DIAGNOSIS — Z79899 Other long term (current) drug therapy: Secondary | ICD-10-CM | POA: Insufficient documentation

## 2016-11-11 DIAGNOSIS — R21 Rash and other nonspecific skin eruption: Secondary | ICD-10-CM | POA: Insufficient documentation

## 2016-11-11 DIAGNOSIS — F419 Anxiety disorder, unspecified: Secondary | ICD-10-CM | POA: Insufficient documentation

## 2016-11-11 DIAGNOSIS — R42 Dizziness and giddiness: Secondary | ICD-10-CM | POA: Insufficient documentation

## 2016-11-11 DIAGNOSIS — R200 Anesthesia of skin: Secondary | ICD-10-CM

## 2016-11-11 MED ORDER — OMEPRAZOLE 20 MG PO CPDR: 20.0000 mg | DELAYED_RELEASE_CAPSULE | Freq: Every day | ORAL | 2 refills | Status: DC

## 2016-11-11 MED ORDER — FLUCONAZOLE 150 MG PO TABS: 150.0000 mg | ORAL_TABLET | ORAL | 1 refills | Status: DC

## 2016-11-11 MED ORDER — FLUCONAZOLE 150 MG PO TABS: 150.0000 mg | ORAL_TABLET | ORAL | 0 refills | Status: DC

## 2016-11-11 MED FILL — ?OMEPRAZOLE DR 20 MG CAPSUL: 20 | 30 days supply | Qty: 30 | Fill #0

## 2016-11-11 MED FILL — FLUCONAZOLE 150 MG TABLET: 150 | 28 days supply | Qty: 4 | Fill #0

## 2016-11-11 NOTE — Assessment & Plan Note (Signed)
Resolved

## 2016-11-11 NOTE — Assessment & Plan Note (Addendum)
Refractory to nizoral cream Weekly diflucan for  4-8 weeks ordered

## 2016-11-11 NOTE — Patient Instructions (Addendum)
Andrew Sandoval was seen today for anxiety.  Diagnoses and all orders for this visit:  Numbness  Tinea pedis of right foot -     fluconazole (DIFLUCAN) 150 MG tablet; Take 1 tablet (150 mg total) by mouth once a week.  Enlarged tonsils -     Ambulatory referral to ENT -     omeprazole (PRILOSEC) 20 MG capsule; Take 1 capsule (20 mg total) by mouth daily.   I recommend tonic water for cramping   F/u in 8 weeks for foot fungus and enlarged tonsils   Dr. Armen PickupFunches

## 2016-11-11 NOTE — Progress Notes (Addendum)
Subjective:  Patient ID: Emmanuel Gruenhagen, male    DOB: Aug 26, 1980  Age: 36 y.o. MRN: [id number]  CC: Anxiety Stratus video Spanish interpreter Trin[place] Post ID # [id number]  HPI Maxson Oddo presents for   1. Chest pains: resolved with Buspar. He is no longer taking Buspar.    Today he reports cramping in his R arm and R leg. Symptoms occur when he sits for 30 minutes or more. No symptoms on the L side. He denies associated weakness or numbness. He admits to dizziness at times. Dizziness occurs when he rises from sitting quickly.   2. Tinea pedis: R foot. Pruritic rash on dorsum or R foot and in between 4th and 5th toes. He has used nizoral cream without improvement.   3. Enlarged tonsils: this is chronic. No pain. Snoring. Trouble sleeping at times.   Social History  Substance Use Topics  . Smoking status: Former Games developer  . Smokeless tobacco: Never Used     Comment: quit 12 years ago  . Alcohol use No     Comment: quit 12 years ago    Outpatient Medications Prior to Visit  Medication Sig Dispense Refill  . aspirin (ASPIRIN 81) 81 MG chewable tablet Chew 1 tablet (81 mg total) by mouth daily. (Patient not taking: Reported on 11/11/2016) 30 tablet 3  . busPIRone (BUSPAR) 7.5 MG tablet Take 1 tablet (7.5 mg total) by mouth 2 (two) times daily as needed. (Patient not taking: Reported on 11/11/2016) 60 tablet 3  . gabapentin (NEURONTIN) 300 MG capsule Take 1 capsule (300 mg total) by mouth 2 (two) times daily. (Patient not taking: Reported on 11/11/2016) 60 capsule 3  . omeprazole (PRILOSEC) 20 MG capsule Take 1 capsule (20 mg total) by mouth 2 (two) times daily before a meal. (Patient not taking: Reported on 09/25/2016) 28 capsule 0   No facility-administered medications prior to visit.     ROS Review of Systems  Constitutional: Negative for chills, fatigue, fever and unexpected weight change.  Eyes: Negative for visual disturbance.  Respiratory: Negative for cough and shortness of  breath.   Cardiovascular: Negative for chest pain, palpitations and leg swelling.  Gastrointestinal: Negative for abdominal pain, blood in stool, constipation, diarrhea, nausea and vomiting.  Endocrine: Negative for polydipsia, polyphagia and polyuria.  Musculoskeletal: Negative for arthralgias, back pain, gait problem, myalgias and neck pain.  Skin: Positive for rash.  Allergic/Immunologic: Negative for immunocompromised state.  Neurological: Positive for dizziness, weakness and numbness (numbness in R arm and leg ).  Hematological: Negative for adenopathy. Does not bruise/bleed easily.  Psychiatric/Behavioral: Negative for dysphoric mood, sleep disturbance and suicidal ideas. The patient is not nervous/anxious.     Objective:  BP 117/66   Pulse (!) 55   Temp 98 F (36.7 C) (Oral)   Wt 159 lb 6.4 oz (72.3 kg)   SpO2 99%   BMI 27.58 kg/m   BP/Weight 11/11/2016 09/25/2016 08/01/2016  Systolic BP 117 119 118  Diastolic BP 66 76 79  Wt. (Lbs) 159.4 158 160  BMI 27.58 27.33 32.26    Pulse Readings from Last 3 Encounters:  11/11/16 (!) 55  09/25/16 (!) 49  08/01/16 61    Physical Exam  Constitutional: He appears well-developed and well-nourished. No distress.  HENT:  Head: Normocephalic and atraumatic.  Mouth/Throat:    Neck: Normal range of motion. Neck supple.  Cardiovascular: Normal rate, regular rhythm, normal heart sounds and intact distal pulses.   Pulmonary/Chest: Effort normal and breath sounds normal.  Musculoskeletal: He exhibits no edema.       Feet:  Neurological: He is alert.  Skin: Skin is warm and dry. No rash noted. No erythema.  Psychiatric: He has a normal mood and affect.   Depression screen PHQ 2/9 11/11/2016  Decreased Interest 2  Down, Depressed, Hopeless 1  PHQ - 2 Score 3  Altered sleeping 0  Tired, decreased energy 1  Change in appetite 1  Feeling bad or failure about yourself  1  Trouble concentrating 1  Moving slowly or fidgety/restless 1    Suicidal thoughts 0  PHQ-9 Score 8    GAD 7 : Generalized Anxiety Score 11/11/2016 09/25/2016  Nervous, Anxious, on Edge 1 (No Data)  Control/stop worrying 1 (No Data)  Worry too much - different things 0 (No Data)  Trouble relaxing 0 (No Data)  Restless 1 (No Data)  Easily annoyed or irritable 1 (No Data)  Afraid - awful might happen 2 (No Data)  Total GAD 7 Score 6 -    Assessment & Plan:  [name] was seen today for anxiety.  Diagnoses and all orders for this visit:  Numbness  Tinea pedis of right foot -     Discontinue: fluconazole (DIFLUCAN) 150 MG tablet; Take 1 tablet (150 mg total) by mouth once a week. -     fluconazole (DIFLUCAN) 150 MG tablet; Take 1 tablet (150 mg total) by mouth once a week. For 4-8 weeks  Enlarged tonsils -     Ambulatory referral to ENT -     omeprazole (PRILOSEC) 20 MG capsule; Take 1 capsule (20 mg total) by mouth daily.  Chest pain, unspecified type   There are no diagnoses linked to this encounter.  No orders of the defined types were placed in this encounter.   Follow-up: Return in about 8 weeks (around 01/06/2017) for tinea pedis, enlarged tonsils .   Dessa PhiJosalyn Usiel Astarita MD

## 2016-11-11 NOTE — Assessment & Plan Note (Signed)
Chronic ENT referral Restart prilosec 20 mg daily

## 2016-11-11 NOTE — Assessment & Plan Note (Signed)
Intermittent in R foot with cramping in R arm and leg Advised tonic water

## 2016-12-19 ENCOUNTER — Encounter: Payer: Self-pay | Admitting: Family Medicine

## 2016-12-19 NOTE — Progress Notes (Signed)
Patient has ENT appointment with Dr. Suszanne Connerseoh on 01/30/17 at 2:00 pm

## 2016-12-19 NOTE — Progress Notes (Signed)
Thank You Dr Funches  °

## 2016-12-23 MED FILL — ?OMEPRAZOLE DR 20 MG CAPSUL: 20 | 30 days supply | Qty: 30 | Fill #1

## 2017-01-30 ENCOUNTER — Ambulatory Visit (INDEPENDENT_AMBULATORY_CARE_PROVIDER_SITE_OTHER): Payer: Self-pay | Admitting: Otolaryngology

## 2017-01-30 DIAGNOSIS — H6122 Impacted cerumen, left ear: Secondary | ICD-10-CM

## 2017-01-30 DIAGNOSIS — H9012 Conductive hearing loss, unilateral, left ear, with unrestricted hearing on the contralateral side: Secondary | ICD-10-CM

## 2017-01-30 DIAGNOSIS — J351 Hypertrophy of tonsils: Secondary | ICD-10-CM

## 2017-02-27 ENCOUNTER — Ambulatory Visit (INDEPENDENT_AMBULATORY_CARE_PROVIDER_SITE_OTHER): Payer: Self-pay | Admitting: Otolaryngology

## 2017-02-27 DIAGNOSIS — H9012 Conductive hearing loss, unilateral, left ear, with unrestricted hearing on the contralateral side: Secondary | ICD-10-CM

## 2017-02-27 DIAGNOSIS — H6122 Impacted cerumen, left ear: Secondary | ICD-10-CM

## 2017-03-04 ENCOUNTER — Ambulatory Visit: Payer: Self-pay | Attending: Internal Medicine

## 2017-04-22 ENCOUNTER — Encounter: Payer: Self-pay | Admitting: Physician Assistant

## 2017-04-22 ENCOUNTER — Ambulatory Visit: Payer: Self-pay | Attending: Family Medicine | Admitting: Physician Assistant

## 2017-04-22 ENCOUNTER — Other Ambulatory Visit: Payer: Self-pay

## 2017-04-22 VITALS — BP 115/72 | HR 66 | Temp 98.7°F | Resp 16 | Ht 63.5 in | Wt 156.0 lb

## 2017-04-22 DIAGNOSIS — Z79899 Other long term (current) drug therapy: Secondary | ICD-10-CM | POA: Insufficient documentation

## 2017-04-22 DIAGNOSIS — R42 Dizziness and giddiness: Secondary | ICD-10-CM | POA: Insufficient documentation

## 2017-04-22 DIAGNOSIS — R252 Cramp and spasm: Secondary | ICD-10-CM | POA: Insufficient documentation

## 2017-04-22 DIAGNOSIS — Z789 Other specified health status: Secondary | ICD-10-CM

## 2017-04-22 MED ORDER — MELOXICAM 15 MG PO TABS: 15.0000 mg | ORAL_TABLET | Freq: Every day | ORAL | 0 refills | Status: DC

## 2017-04-22 MED FILL — MELOXICAM 15 MG TABLET: 15 | 30 days supply | Qty: 30 | Fill #0

## 2017-04-22 NOTE — Progress Notes (Signed)
Feels like my hands are sleepy, since last Wednesday.

## 2017-04-22 NOTE — Progress Notes (Signed)
Patient ID: Andrew Sandoval, male   DOB: 02/11/1981, 36 y.o.   MRN: [id number]     Andrew Sandoval, is a 36 y.o. male  [id number]  [id number]  DOB - 12/02/1980  Subjective:  Chief Complaint and HPI: Andrew Sandoval is a 36 y.o. male here today B hand cramps for 3-4 days. No new activities.  Lasts about 1 minute and occurs only in the morning for a few mins after awakening.  Does not wake him from sleep.  NKI.   R hand dominant. Works in a Surveyor, miningkitchen, [place] [employer].    Dizziness around 10am about 1 week ago for 3 or 4 minutes.  This occurred when he bent down quickly to pick something up.  No CP/SOB/paresthesias. Saw cardiology for CP in January and cardiac work-up was negative.  No radiating symptoms.  Only occurred once; hasn't happened again.    He also really wants to check his cholesterol today but he has eaten curds, cheese, milk, and chicken tortillas.    Family history:  No FH early cardiac problems  [employer] "Marchelle Folksmanda" used.    ROS:   Constitutional:  No f/c, No night sweats, No unexplained weight loss. EENT:  No vision changes, No blurry vision, No hearing changes. No mouth, throat, or ear problems.  Respiratory: No cough, No SOB Cardiac: No CP, no palpitations GI:  No abd pain, No N/V/D. GU: No Urinary s/sx Musculoskeletal: +whole hand cramping Neuro: No headache, + dizziness, no motor weakness.  Skin: No rash Endocrine:  No polydipsia. No polyuria.  Psych: Denies SI/HI  No problems updated.  ALLERGIES: Allergies  Allergen Reactions  . Tylenol [Acetaminophen] Hives    PAST MEDICAL HISTORY: Past Medical History:  Diagnosis Date  . Cocaine use 2006-2010    MEDICATIONS AT HOME: Prior to Admission medications   Medication Sig Start Date End Date Taking? Authorizing Provider  fluconazole (DIFLUCAN) 150 MG tablet Take 1 tablet (150 mg total) by mouth once a week. For 4-8 weeks 11/11/16   Dessa PhiFunches, Josalyn, MD  meloxicam (MOBIC) 15 MG  tablet Take 1 tablet (15 mg total) daily by mouth. X 1 week for hand cramping 04/22/17   Georgian CoMcClung, Mande Auvil M, PA-C  omeprazole (PRILOSEC) 20 MG capsule Take 1 capsule (20 mg total) by mouth daily. 11/11/16   Funches, Gerilyn NestleJosalyn, MD     Objective:  EXAM:   Vitals:   04/22/17 1537  BP: 115/72  Pulse: 66  Resp: 16  Temp: 98.7 F (37.1 C)  TempSrc: Oral  SpO2: 97%  Weight: 156 lb (70.8 kg)  Height: 5' 3.5" (1.613 m)    General appearance : A&OX3. NAD. Non-toxic-appearing HEENT: Atraumatic and Normocephalic.  PERRLA. EOM intact.  TM clear B. Mouth-MMM, post pharynx WNL w/o erythema, No PND. Neck: supple, no JVD. No cervical lymphadenopathy. No thyromegaly Chest/Lungs:  Breathing-non-labored, Good air entry bilaterally, breath sounds normal without rales, rhonchi, or wheezing  CVS: S1 S2 regular, no murmurs, gallops, rubs  Extremities: B hands with full S&ROM with grip.  Sensory in tact B.  UEDTR=intact B.  Bilateral Lower Ext shows no edema, both legs are warm to touch with = pulse throughout Neurology:  CN II-XII grossly intact, Non focal.   Psych:  TP linear. J/I WNL. Normal speech. Appropriate eye contact and affect.  Skin:  No Rash  Data Review Lab Results  Component Value Date   HGBA1C 5.4 09/25/2016   HGBA1C 5.50 01/18/2015     Assessment & Plan  1. Dizziness No red flags today, EKG looks great/WNL/no ischemic changes.   - Basic metabolic panel - TSH - Vitamin D, 25-hydroxy  2. Cramping of hands meloxicam daily X 1 week then prn pain - Basic metabolic panel - TSH  Deferred cholesterol testing because he is not fasting and has eating high fat foods all day.  He can have this checked at his f/up visit in 6 weeks.    Patient have been counseled extensively about nutrition and exercise  Return in about 6 weeks (around 06/03/2017) for Dr Alvis LemmingsNewlin; follow-up dizzines and fasting cholesterol.  The patient was given clear instructions to go to ER or return to medical  center if symptoms don't improve, worsen or new problems develop. The patient verbalized understanding. The patient was told to call to get lab results if they haven't heard anything in the next week.     Georgian CoAngela Aldo Sondgeroth, PA-C [facility] [place], [place] [phone]   04/22/2017, 3:49 PM

## 2017-04-23 ENCOUNTER — Other Ambulatory Visit: Payer: Self-pay | Admitting: Physician Assistant

## 2017-04-23 DIAGNOSIS — E559 Vitamin D deficiency, unspecified: Secondary | ICD-10-CM

## 2017-04-23 LAB — VITAMIN D 25 HYDROXY (VIT D DEFICIENCY, FRACTURES): Vit D, 25-Hydroxy: 26 ng/mL — ABNORMAL LOW (ref 30.0–100.0)

## 2017-04-23 LAB — BASIC METABOLIC PANEL
BUN / CREAT RATIO: 23 — AB (ref 9–20)
BUN: 22 mg/dL — ABNORMAL HIGH (ref 6–20)
CO2: 25 mmol/L (ref 20–29)
CREATININE: 0.95 mg/dL (ref 0.76–1.27)
Calcium: 9.7 mg/dL (ref 8.7–10.2)
Chloride: 102 mmol/L (ref 96–106)
GFR calc Af Amer: 118 mL/min/{1.73_m2} (ref >59)
GFR, EST NON AFRICAN AMERICAN: 102 mL/min/{1.73_m2} (ref >59)
Glucose: 85 mg/dL (ref 65–99)
Potassium: 4.2 mmol/L (ref 3.5–5.2)
SODIUM: 142 mmol/L (ref 134–144)

## 2017-04-23 LAB — TSH: TSH: 1.94 u[IU]/mL (ref 0.450–4.500)

## 2017-04-23 MED ORDER — VITAMIN D (ERGOCALCIFEROL) 1.25 MG (50000 UNIT) PO CAPS: 50000.0000 [IU] | ORAL_CAPSULE | ORAL | 0 refills | Status: DC

## 2017-04-23 MED FILL — VIT D2 1.25 MG (50,000 UNIT: 1.25 MG | 28 days supply | Qty: 4 | Fill #0

## 2017-04-24 ENCOUNTER — Telehealth: Payer: Self-pay | Admitting: Family Medicine

## 2017-04-24 NOTE — Telephone Encounter (Signed)
Pt came in to inform that a shot he received is bruised and swollen. Pt was offered triage nurse but refused. Would like a call back in spanish for advice. Please follow up.

## 2017-04-24 NOTE — Telephone Encounter (Signed)
Spoke to patient, Explained he may have a hematoma at the site where the blood was collected. He should place a warm compress on his arm. If pain should occur, [facility]

## 2017-04-28 ENCOUNTER — Telehealth: Payer: Self-pay | Admitting: *Deleted

## 2017-04-28 NOTE — Telephone Encounter (Signed)
Please call patient and let them that their vitamin D is low. This can contribute to muscle aches, anxiety, fatigue, and depression. I have sent a prescription to the pharmacy for them to take once a week. We will recheck this level in 3-4 months. His other labs including blood sugar and electrolytes were normal and didn't show any other cause for hand cramps. Follow up as planned. Thanks,  Andrew CoAngela McClung, PA-C   Pt aware of results and result message. Aware of foods to eat to increase vitamin D and medication at pharmacy. Pt verbalized understanding.   He c/o pain in testicle. Denies dysuria, swelling, or discharge. Pt encourage to f/u with provider.

## 2017-06-06 ENCOUNTER — Ambulatory Visit: Payer: Self-pay | Attending: Family Medicine | Admitting: Family Medicine

## 2017-06-06 ENCOUNTER — Encounter: Payer: Self-pay | Admitting: Family Medicine

## 2017-06-06 ENCOUNTER — Ambulatory Visit: Payer: Self-pay

## 2017-06-06 VITALS — BP 126/72 | HR 60 | Temp 97.5°F | Resp 18 | Ht 64.0 in | Wt 157.0 lb

## 2017-06-06 DIAGNOSIS — Z23 Encounter for immunization: Secondary | ICD-10-CM | POA: Insufficient documentation

## 2017-06-06 DIAGNOSIS — Z13228 Encounter for screening for other metabolic disorders: Secondary | ICD-10-CM

## 2017-06-06 DIAGNOSIS — Z79899 Other long term (current) drug therapy: Secondary | ICD-10-CM | POA: Insufficient documentation

## 2017-06-06 DIAGNOSIS — R42 Dizziness and giddiness: Secondary | ICD-10-CM | POA: Insufficient documentation

## 2017-06-06 DIAGNOSIS — Z1389 Encounter for screening for other disorder: Secondary | ICD-10-CM | POA: Insufficient documentation

## 2017-06-06 NOTE — Progress Notes (Signed)
Subjective:  Patient ID: Andrew Sandoval, male    DOB: Mar 04, 1981  Age: 36 y.o. MRN: [id number]  CC: Labs Only and Dizziness   HPI Andrew Sandoval presents for a follow-up visit.  At his last visit with the PA, 1 month ago he had complained of dizziness and workup by means of EKG, TSH, vitamin D came back normal and  on further questioning he informs me he has no concerns today, dizziness has resolved and he is fasting in anticipation of labs today.  He is not up-to-date on the flu shot and would like to receive it today.  Past Medical History:  Diagnosis Date  . Cocaine use 2006-2010    Past Surgical History:  Procedure Laterality Date  . No prior surgery       Outpatient Medications Prior to Visit  Medication Sig Dispense Refill  . meloxicam (MOBIC) 15 MG tablet Take 1 tablet (15 mg total) daily by mouth. X 1 week for hand cramping 30 tablet 0  . omeprazole (PRILOSEC) 20 MG capsule Take 1 capsule (20 mg total) by mouth daily. 30 capsule 2  . fluconazole (DIFLUCAN) 150 MG tablet Take 1 tablet (150 mg total) by mouth once a week. For 4-8 weeks 4 tablet 1  . Vitamin D, Ergocalciferol, (DRISDOL) 50000 units CAPS capsule Take 1 capsule (50,000 Units total) every 7 (seven) days by mouth. 16 capsule 0   No facility-administered medications prior to visit.     ROS Review of Systems  Constitutional: Negative for activity change and appetite change.  HENT: Negative for sinus pressure and sore throat.   Eyes: Negative for visual disturbance.  Respiratory: Negative for cough, chest tightness and shortness of breath.   Cardiovascular: Negative for chest pain and leg swelling.  Gastrointestinal: Negative for abdominal distention, abdominal pain, constipation and diarrhea.  Endocrine: Negative.   Genitourinary: Negative for dysuria.  Musculoskeletal: Negative for joint swelling and myalgias.  Skin: Negative for rash.  Allergic/Immunologic: Negative.   Neurological: Negative  for weakness, light-headedness and numbness.  Psychiatric/Behavioral: Negative for dysphoric mood and suicidal ideas.    Objective:  BP 126/72 (BP Location: Left Arm, Patient Position: Sitting, Cuff Size: Normal)   Pulse 60   Temp (!) 97.5 F (36.4 C) (Oral)   Resp 18   Ht '5\' 4"'  (1.626 m)   Wt 157 lb (71.2 kg)   SpO2 98%   BMI 26.95 kg/m   BP/Weight 06/06/2017 75/17/0017 09/17/4494  Systolic BP 759 163 846  Diastolic BP 72 72 66  Wt. (Lbs) 157 156 159.4  BMI 26.95 27.2 27.58      Physical Exam  Constitutional: He is oriented to person, place, and time. He appears well-developed and well-nourished.  Cardiovascular: Normal rate, normal heart sounds and intact distal pulses.  No murmur heard. Pulmonary/Chest: Effort normal and breath sounds normal. He has no wheezes. He has no rales. He exhibits no tenderness.  Abdominal: Soft. Bowel sounds are normal. He exhibits no distension and no mass. There is no tenderness.  Musculoskeletal: Normal range of motion.  Neurological: He is alert and oriented to person, place, and time.  Skin: Skin is warm and dry.  Psychiatric: He has a normal mood and affect.     Assessment & Plan:   1. Screening for metabolic disorder - KZL93+TTSV - Lipid panel - CBC with Differential/Platelet  2. Need for influenza vaccination - Flu Vaccine QUAD 6+ mos PF IM (Fluarix Quad PF)   No orders of the defined  types were placed in this encounter.   Follow-up: Return in about 6 months (around 12/05/2017) for coordination of care.   Arnoldo Morale MD

## 2017-06-06 NOTE — Patient Instructions (Signed)
Influenza Virus Vaccine (Flucelvax) Qu es este medicamento? La VACUNA ANTIGRIPAL ayuda a disminuir el riesgo de contraer la influenza, tambin conocida como la gripe. La vacuna solo ayuda a protegerle contra algunas cepas de influenza. Este medicamento puede ser utilizado para otros usos; si tiene alguna pregunta consulte con su proveedor de atencin mdica o con su farmacutico. MARCAS COMUNES: FLUCELVAX Qu le debo informar a mi profesional de la salud antes de tomar este medicamento? Necesita saber si usted presenta alguno de los siguientes problemas o situaciones: -trastorno de sangrado como hemofilia -fiebre o infeccin -sndrome de Guillain-Barre u otros problemas neurolgicos -problemas del sistema inmunolgico -infeccin por el virus de la inmunodeficiencia humana (VIH) o SIDA -niveles bajos de plaquetas en la sangre -esclerosis mltiple -una reaccin alrgica o inusual a las vacunas antigripales, a otros medicamentos, alimentos, colorantes o conservantes -si est embarazada o buscando quedar embarazada -si est amamantando a un beb Cmo debo utilizar este medicamento? Esta vacuna se administra mediante inyeccin por va intramuscular. Lo administra un profesional de la salud. Recibir una copia de informacin escrita sobre la vacuna antes de cada vacuna. Asegrese de leer este folleto cada vez cuidadosamente. Este folleto puede cambiar con frecuencia. Hable con su pediatra para informarse acerca del uso de este medicamento en nios. Puede requerir atencin especial. Sobredosis: Pngase en contacto inmediatamente con un centro toxicolgico o una sala de urgencia si usted cree que haya tomado demasiado medicamento. ATENCIN: Este medicamento es solo para usted. No comparta este medicamento con nadie. Qu sucede si me olvido de una dosis? No se aplica en este caso. Qu puede interactuar con este medicamento? -quimioterapia o radioterapia -medicamentos que suprimen el sistema  inmunolgico, tales como etanercept, anakinra, infliximab y adalimumab -medicamentos que tratan o previenen cogulos sanguneos, como warfarina -fenitona -medicamentos esteroideos, como la prednisona o la cortisona -teofilina -vacunas Puede ser que esta lista no menciona todas las posibles interacciones. Informe a su profesional de la salud de todos los productos a base de hierbas, medicamentos de venta libre o suplementos nutritivos que est tomando. Si usted fuma, consume bebidas alcohlicas o si utiliza drogas ilegales, indqueselo tambin a su profesional de la salud. Algunas sustancias pueden interactuar con su medicamento. A qu debo estar atento al usar este medicamento? Informe a su mdico o a su profesional de la salud sobre todos los efectos secundarios que persistan despus de 3 das. Llame a su proveedor de atencin mdica si se presentan sntomas inusuales dentro de las 6 semanas de recibir esta vacuna. Es posible que todava pueda contraer la gripe, pero la enfermedad no ser tan fuerte como normalmente. No puede contraer la gripe de esta vacuna. La vacuna antigripal no le protege contra resfros u otras enfermedades que pueden causar fiebre. Debe vacunarse cada ao. Qu efectos secundarios puedo tener al utilizar este medicamento? Efectos secundarios que debe informar a su mdico o a su profesional de la salud tan pronto como sea posible: -reacciones alrgicas como erupcin cutnea, picazn o urticarias, hinchazn de la cara, labios o lengua Efectos secundarios que, por lo general, no requieren atencin mdica (debe informarlos a su mdico o a su profesional de la salud si persisten o si son molestos): -fiebre -dolor de cabeza -molestias y dolores musculares -dolor, sensibilidad, enrojecimiento o hinchazn en el lugar de la inyeccin -cansancio Puede ser que esta lista no menciona todos los posibles efectos secundarios. Comunquese a su mdico por asesoramiento mdico sobre los  efectos secundarios. Usted puede informar los efectos secundarios a la FDA por telfono   al 1-800-FDA-1088. Dnde debo guardar mi medicina? Esta vacuna se administrar por un profesional de la salud en una clnica, farmacia, consultorio mdico u otro consultorio de un profesional de la salud. No se le suministrar esta vacuna para guardar en su domicilio. ATENCIN: Este folleto es un resumen. Puede ser que no cubra toda la posible informacin. Si usted tiene preguntas acerca de esta medicina, consulte con su mdico, su farmacutico o su profesional de la salud.  2018 Elsevier/Gold Standard (2011-05-13 16:29:16)  

## 2017-06-07 LAB — LIPID PANEL
CHOL/HDL RATIO: 6.7 ratio — AB (ref 0.0–5.0)
Cholesterol, Total: 227 mg/dL — ABNORMAL HIGH (ref 100–199)
HDL: 34 mg/dL — ABNORMAL LOW (ref >39)
LDL Calculated: 134 mg/dL — ABNORMAL HIGH (ref 0–99)
Triglycerides: 297 mg/dL — ABNORMAL HIGH (ref 0–149)
VLDL Cholesterol Cal: 59 mg/dL — ABNORMAL HIGH (ref 5–40)

## 2017-06-07 LAB — CMP14+EGFR
A/G RATIO: 1.6 (ref 1.2–2.2)
ALBUMIN: 4.6 g/dL (ref 3.5–5.5)
ALT: 44 IU/L (ref 0–44)
AST: 25 IU/L (ref 0–40)
Alkaline Phosphatase: 99 IU/L (ref 39–117)
BUN / CREAT RATIO: 17 (ref 9–20)
BUN: 16 mg/dL (ref 6–20)
Bilirubin Total: 0.7 mg/dL (ref 0.0–1.2)
CALCIUM: 9.5 mg/dL (ref 8.7–10.2)
CO2: 21 mmol/L (ref 20–29)
Chloride: 106 mmol/L (ref 96–106)
Creatinine, Ser: 0.94 mg/dL (ref 0.76–1.27)
GFR, EST AFRICAN AMERICAN: 119 mL/min/{1.73_m2} (ref >59)
GFR, EST NON AFRICAN AMERICAN: 103 mL/min/{1.73_m2} (ref >59)
GLOBULIN, TOTAL: 2.9 g/dL (ref 1.5–4.5)
Glucose: 90 mg/dL (ref 65–99)
POTASSIUM: 4.5 mmol/L (ref 3.5–5.2)
SODIUM: 145 mmol/L — AB (ref 134–144)
TOTAL PROTEIN: 7.5 g/dL (ref 6.0–8.5)

## 2017-06-07 LAB — CBC WITH DIFFERENTIAL/PLATELET
BASOS ABS: 0 10*3/uL (ref 0.0–0.2)
Basos: 0 %
EOS (ABSOLUTE): 0.1 10*3/uL (ref 0.0–0.4)
Eos: 2 %
HEMOGLOBIN: 16.3 g/dL (ref 13.0–17.7)
Hematocrit: 48.5 % (ref 37.5–51.0)
IMMATURE GRANS (ABS): 0 10*3/uL (ref 0.0–0.1)
Immature Granulocytes: 0 %
LYMPHS: 48 %
Lymphocytes Absolute: 2.1 10*3/uL (ref 0.7–3.1)
MCH: 30.6 pg (ref 26.6–33.0)
MCHC: 33.6 g/dL (ref 31.5–35.7)
MCV: 91 fL (ref 79–97)
MONOCYTES: 7 %
Monocytes Absolute: 0.3 10*3/uL (ref 0.1–0.9)
Neutrophils Absolute: 1.9 10*3/uL (ref 1.4–7.0)
Neutrophils: 43 %
PLATELETS: 303 10*3/uL (ref 150–379)
RBC: 5.33 x10E6/uL (ref 4.14–5.80)
RDW: 13.6 % (ref 12.3–15.4)
WBC: 4.5 10*3/uL (ref 3.4–10.8)

## 2017-06-09 ENCOUNTER — Other Ambulatory Visit: Payer: Self-pay | Admitting: Family Medicine

## 2017-06-09 DIAGNOSIS — E785 Hyperlipidemia, unspecified: Secondary | ICD-10-CM

## 2017-06-09 DIAGNOSIS — E7800 Pure hypercholesterolemia, unspecified: Secondary | ICD-10-CM

## 2017-06-09 HISTORY — DX: Hyperlipidemia, unspecified: E78.5

## 2017-06-09 MED ORDER — ATORVASTATIN CALCIUM 20 MG PO TABS: 20.0000 mg | ORAL_TABLET | Freq: Every day | ORAL | 3 refills | Status: DC

## 2017-06-11 ENCOUNTER — Telehealth: Payer: Self-pay

## 2017-06-11 NOTE — Telephone Encounter (Signed)
Pt was called and VM is not set up to leave a message. 

## 2017-06-12 ENCOUNTER — Telehealth: Payer: Self-pay

## 2017-06-12 MED FILL — ?ATORVASTATIN 20 MG TABLET: 20 | 30 days supply | Qty: 30 | Fill #0

## 2017-06-12 NOTE — Telephone Encounter (Signed)
Pt was called and there is no VM set up to leave a message. 

## 2017-08-20 ENCOUNTER — Telehealth: Payer: Self-pay | Admitting: Family Medicine

## 2017-08-20 NOTE — Telephone Encounter (Signed)
Pt called to inform his PCP or nurse know that he has been having cramps in his legs while standing and when he wakes up. He was advised to go to urgent care bc provider is completely full. Please follow up

## 2017-08-28 ENCOUNTER — Ambulatory Visit (INDEPENDENT_AMBULATORY_CARE_PROVIDER_SITE_OTHER): Payer: Self-pay | Admitting: Otolaryngology

## 2017-10-06 ENCOUNTER — Ambulatory Visit: Payer: Self-pay | Attending: Family Medicine

## 2017-10-09 ENCOUNTER — Ambulatory Visit: Payer: Self-pay | Attending: Family Medicine | Admitting: Physician Assistant

## 2017-10-09 VITALS — BP 123/77 | HR 56 | Temp 98.4°F | Resp 16 | Ht 64.0 in | Wt 156.0 lb

## 2017-10-09 DIAGNOSIS — R252 Cramp and spasm: Secondary | ICD-10-CM | POA: Insufficient documentation

## 2017-10-09 DIAGNOSIS — H6062 Unspecified chronic otitis externa, left ear: Secondary | ICD-10-CM | POA: Insufficient documentation

## 2017-10-09 DIAGNOSIS — Z789 Other specified health status: Secondary | ICD-10-CM

## 2017-10-09 DIAGNOSIS — Z79899 Other long term (current) drug therapy: Secondary | ICD-10-CM | POA: Insufficient documentation

## 2017-10-09 NOTE — Progress Notes (Signed)
Patient ID: Andrew Sandoval, male   DOB: 18-Jul-1980, 36 y.o.   MRN: [id number]      Andrew Sandoval, is a 37 y.o. male  [id number]  NFA:[id number]  DOB - Aug 12, 1980  Subjective:  Chief Complaint and HPI: Andrew Sandoval is a 37 y.o. male here R leg and L arm cramping for 2 months.  In the leg, occurs after sitting for about 15 minutes. L arm occurs mostly in the afternoon.  No pain in chest.  Not really painful; just bothersome.  Doesn't drink alcohol.  Drinks about 4 glasses of water per day.  No LBP; no neck pain.  No weakness.  No paresthesias.    Also wants me to recheck his L ear.  He went to an ENT and was put on drops and was supposed to follow-up but missed f/up appt.  No pain or problems with it.    [employer] interpre[employer] "Nettie Elm"  social:  Works out doors, plays soccer for fun  ROS:   Constitutional:  No f/c, No night sweats, No unexplained weight loss. EENT:  No vision changes, No blurry vision, No hearing changes. No addtional mouth, throat, or ear problems.  Respiratory: No cough, No SOB Cardiac: No CP, no palpitations GI:  No abd pain, No N/V/D. GU: No Urinary s/sx Musculoskeletal: cramping in R leg and L arm Neuro: No headache, no dizziness, no motor weakness.  Skin: No rash Endocrine:  No polydipsia. No polyuria.  Psych: Denies SI/HI  No problems updated.  ALLERGIES: Allergies  Allergen Reactions  . Tylenol [Acetaminophen] Hives    PAST MEDICAL HISTORY: Past Medical History:  Diagnosis Date  . Cocaine use 2006-2010    MEDICATIONS AT HOME: Prior to Admission medications   Medication Sig Start Date End Date Taking? Authorizing Provider  atorvastatin (LIPITOR) 20 MG tablet Take 1 tablet (20 mg total) by mouth daily. Patient not taking: Reported on 10/09/2017 06/09/17   Hoy Register, MD     Objective:  EXAM:   Vitals:   10/09/17 0913  BP: 123/77  Pulse: (!) 56  Resp: 16  Temp: 98.4 F (36.9 C)  TempSrc: Oral  SpO2: 98%  Weight:  156 lb (70.8 kg)  Height:  (1.626 m)    General appearance : A&OX3. NAD. Non-toxic-appearing HEENT: Atraumatic and Normocephalic.  PERRLA. EOM intact.  L canal is swollen but no pain with pinna manipulation.  TM clear B.  R canal is WNL Mouth-MMM, post pharynx WNL w/o erythema, No PND. Neck: supple, no JVD. No cervical lymphadenopathy. No thyromegaly Chest/Lungs:  Breathing-non-labored, Good air entry bilaterally, breath sounds normal without rales, rhonchi, or wheezing  CVS: S1 S2 regular, no murmurs, gallops, rubs  BU and L Extremities: full S&ROM throughout.  DTR=U/L extremities.  Bilateral Lower Ext shows no edema, both legs are warm to touch with = pulse throughout Neurology:  CN II-XII grossly intact, Non focal.   Psych:  TP linear. J/I WNL. Normal speech. Appropriate eye contact and affect.  Skin:  No Rash  Data Review Lab Results  Component Value Date   HGBA1C 5.4 09/25/2016   HGBA1C 5.50 01/18/2015     Assessment & Plan   1. Muscle cramping ?inadequate water intake for outdoor work.  Consider RLS?  Not interested in meds today - Basic metabolic panel - Magnesium - Vitamin D, 25-hydroxy  2. Chronic otitis externa of left ear, unspecified type No acute infection but L canal is still swollen/non-tender and he missed his f/up appt. Make  f/up appt with ENT.  3. Language barrier [employer] interpreters used and additional time performing visit was required.   Patient have been counseled extensively about nutrition and exercise  F/up 6 weeks with Dr Alvis Lemmings  The patient was given clear instructions to go to ER or return to medical center if symptoms don't improve, worsen or new problems develop. The patient verbalized understanding. The patient was told to call to get lab results if they haven't heard anything in the next week.     Georgian Co, PA-C [facility] [place], [place] [phone]   10/09/2017, 9:30 AM

## 2017-10-09 NOTE — Progress Notes (Signed)
Pt. Stated he is here for his right leg cramp when he sit for 15 minutes.  Pt. Stated his left arm get cramp up in the afternoon.

## 2017-10-09 NOTE — Patient Instructions (Signed)
Drink 80-100 ounces of water daily 

## 2017-10-10 LAB — BASIC METABOLIC PANEL
BUN/Creatinine Ratio: 15 (ref 9–20)
BUN: 16 mg/dL (ref 6–20)
CO2: 20 mmol/L (ref 20–29)
CREATININE: 1.05 mg/dL (ref 0.76–1.27)
Calcium: 9.5 mg/dL (ref 8.7–10.2)
Chloride: 101 mmol/L (ref 96–106)
GFR, EST AFRICAN AMERICAN: 104 mL/min/{1.73_m2} (ref >59)
GFR, EST NON AFRICAN AMERICAN: 90 mL/min/{1.73_m2} (ref >59)
Glucose: 89 mg/dL (ref 65–99)
POTASSIUM: 4.1 mmol/L (ref 3.5–5.2)
Sodium: 140 mmol/L (ref 134–144)

## 2017-10-10 LAB — VITAMIN D 25 HYDROXY (VIT D DEFICIENCY, FRACTURES): Vit D, 25-Hydroxy: 25.4 ng/mL — ABNORMAL LOW (ref 30.0–100.0)

## 2017-10-10 LAB — MAGNESIUM: Magnesium: 2.4 mg/dL — ABNORMAL HIGH (ref 1.6–2.3)

## 2017-10-10 MED FILL — ATORVASTATIN 20 MG TABLET: 20 | 30 days supply | Qty: 30 | Fill #1

## 2017-10-13 ENCOUNTER — Telehealth: Payer: Self-pay

## 2017-10-13 NOTE — Telephone Encounter (Signed)
-----   Message from Bien Yy, CMA sent at 10/13/2017  2:04 PM EDT ----- Please call patient when you get a chance.  

## 2017-10-13 NOTE — Progress Notes (Signed)
Please call patient when you get a chance.

## 2017-10-13 NOTE — Telephone Encounter (Signed)
CMA call regarding lab results   Patient verify DOB   Patient was aware and understood   Patient wanted his results to be printed & will pick up at front desk

## 2017-11-13 ENCOUNTER — Ambulatory Visit (INDEPENDENT_AMBULATORY_CARE_PROVIDER_SITE_OTHER): Payer: Self-pay | Admitting: Otolaryngology

## 2017-11-13 DIAGNOSIS — H9012 Conductive hearing loss, unilateral, left ear, with unrestricted hearing on the contralateral side: Secondary | ICD-10-CM

## 2017-11-13 DIAGNOSIS — H6122 Impacted cerumen, left ear: Secondary | ICD-10-CM

## 2018-01-15 ENCOUNTER — Ambulatory Visit: Payer: Self-pay | Attending: Family Medicine | Admitting: Physician Assistant

## 2018-01-15 VITALS — BP 115/72 | HR 62 | Temp 98.3°F | Ht 64.0 in | Wt 162.6 lb

## 2018-01-15 DIAGNOSIS — Z79899 Other long term (current) drug therapy: Secondary | ICD-10-CM | POA: Insufficient documentation

## 2018-01-15 DIAGNOSIS — B351 Tinea unguium: Secondary | ICD-10-CM

## 2018-01-15 DIAGNOSIS — Z789 Other specified health status: Secondary | ICD-10-CM

## 2018-01-15 DIAGNOSIS — M7712 Lateral epicondylitis, left elbow: Secondary | ICD-10-CM | POA: Insufficient documentation

## 2018-01-15 DIAGNOSIS — Z886 Allergy status to analgesic agent status: Secondary | ICD-10-CM | POA: Insufficient documentation

## 2018-01-15 DIAGNOSIS — B353 Tinea pedis: Secondary | ICD-10-CM | POA: Insufficient documentation

## 2018-01-15 MED ORDER — TERBINAFINE HCL 250 MG PO TABS: 250.0000 mg | ORAL_TABLET | Freq: Every day | ORAL | 0 refills | Status: DC

## 2018-01-15 MED ORDER — NAPROXEN 500 MG PO TABS: 500.0000 mg | ORAL_TABLET | Freq: Two times a day (BID) | ORAL | 0 refills | Status: DC

## 2018-01-15 MED ORDER — CLOTRIMAZOLE-BETAMETHASONE 1-0.05 % EX CREA: 1.0000 "application " | TOPICAL_CREAM | Freq: Two times a day (BID) | CUTANEOUS | 0 refills | Status: DC

## 2018-01-15 MED FILL — NAPROXEN 500 MG TABLET: 500 | 30 days supply | Qty: 60 | Fill #0

## 2018-01-15 MED FILL — CLOTRIMAZOLE-BETAMETHASONE: 1-0.05 | 15 days supply | Qty: 30 | Fill #0

## 2018-01-15 MED FILL — TERBINAFINE HCL 250 MG TAB: 250 | 30 days supply | Qty: 30 | Fill #0

## 2018-01-15 NOTE — Progress Notes (Signed)
Pt. Stated his left arm hurts when he bend his elbow for two months.

## 2018-01-15 NOTE — Progress Notes (Signed)
Patient ID: Andrew Sandoval, male   DOB: 05/08/1981, 37 y.o.   MRN: [id number]    Andrew Sandoval, is a 37 y.o. male  [id number]  NFA:[id number]  DOB - 07/23/1980  Subjective:  Chief Complaint and HPI: Andrew Sandoval is a 37 y.o. male here today for several concerns. 1- L elbow pain for 2 months.  R hand dominant.  +dishwashing. NKI. No golf/tennis.  Some soccer.  Hurts with movement.   2-Rash on the top of his R foot.  +itches-didn't go away with athlete's foot cream.    3-toenail fungus for years. Glucose/LFTs normal 05/2017  Helmut MusterAlicia with [employer] interpre[employer].  ED/Hospital notes reviewed.   Social History: Family history:  ROS:   Constitutional:  No f/c, No night sweats, No unexplained weight loss. EENT:  No vision changes, No blurry vision, No hearing changes. No mouth, throat, or ear problems.  Respiratory: No cough, No SOB Cardiac: No CP, no palpitations GI:  No abd pain, No N/V/D. GU: No Urinary s/sx Musculoskeletal: L elbow pain Neuro: No headache, no dizziness, no motor weakness.  Skin: + rash Endocrine:  No polydipsia. No polyuria.  Psych: Denies SI/HI  No problems updated.  ALLERGIES: Allergies  Allergen Reactions  . Tylenol [Acetaminophen] Hives    PAST MEDICAL HISTORY: Past Medical History:  Diagnosis Date  . Cocaine use 2006-2010    MEDICATIONS AT HOME: Prior to Admission medications   Medication Sig Start Date End Date Taking? Authorizing Provider  atorvastatin (LIPITOR) 20 MG tablet Take 1 tablet (20 mg total) by mouth daily. Patient not taking: Reported on 10/09/2017 06/09/17   Hoy RegisterNewlin, Enobong, MD  clotrimazole-betamethasone (LOTRISONE) cream Apply 1 application topically 2 (two) times daily. 01/15/18   Anders SimmondsMcClung, Angelina Neece M, PA-C  naproxen (NAPROSYN) 500 MG tablet Take 1 tablet (500 mg total) by mouth 2 (two) times daily with a meal. X 2 weeks then prn pain 01/15/18   Georgian CoMcClung, Correll Denbow M, PA-C  terbinafine (LAMISIL) 250 MG tablet Take 1 tablet  (250 mg total) by mouth daily. For toenail fungus. 01/15/18   Anders SimmondsMcClung, Madysin Crisp M, PA-C     Objective:  EXAM:   Vitals:   01/15/18 0849  BP: 115/72  Pulse: 62  Temp: 98.3 F (36.8 C)  TempSrc: Oral  SpO2: 96%  Weight: 162 lb 9.6 oz (73.8 kg)  Height: 5\' 4"  (1.626 m)    General appearance : A&OX3. NAD. Non-toxic-appearing HEENT: Atraumatic and Normocephalic.  PERRLA. EOM intact.   Neck: supple, no JVD. No cervical lymphadenopathy. No thyromegaly Chest/Lungs:  Breathing-non-labored, Good air entry bilaterally, breath sounds normal without rales, rhonchi, or wheezing  CVS: S1 S2 regular, no murmurs, gallops, rubs  Extremities: Bilateral Lower Ext shows no edema, both legs are warm to touch with = pulse throughout L arm/alebow with TTp along lateral epicondyle and brachioradialis.  No swelling/effusion.  Normal grip/strength and ROM.  DTR=B.   Neurology:  CN II-XII grossly intact, Non focal.   Psych:  TP linear. J/I WNL. Normal speech. Appropriate eye contact and affect.  Skin:  No Rash  Data Review Lab Results  Component Value Date   HGBA1C 5.4 09/25/2016   HGBA1C 5.50 01/18/2015     Assessment & Plan   1. Toenail fungus LFT/glucose normal - terbinafine (LAMISIL) 250 MG tablet; Take 1 tablet (250 mg total) by mouth daily. For toenail fungus.  Dispense: 90 tablet; Refill: 0  2. Tinea pedis of right foot - clotrimazole-betamethasone (LOTRISONE) cream; Apply 1 application topically 2 (two) times  daily.  Dispense: 30 g; Refill: 0  3. Lateral epicondylitis of left elbow tennis elbow strap-pulled up photos online.  Wear X 2-3 weeks - naproxen (NAPROSYN) 500 MG tablet; Take 1 tablet (500 mg total) by mouth 2 (two) times daily with a meal. X 2 weeks then prn pain  Dispense: 60 tablet; Refill: 0  4. Language barrier [employer] interpreters used and additional time performing visit was required.      Patient have been counseled extensively about nutrition and exercise  Return  in about 3 months (around 04/17/2018) for Newlin; lipids.  The patient was given clear instructions to go to ER or return to medical center if symptoms don't improve, worsen or new problems develop. The patient verbalized understanding. The patient was told to call to get lab results if they haven't heard anything in the next week.     Georgian Co, PA-C [facility] [place], [place] [phone]   01/15/2018, 9:13 AM

## 2018-01-15 NOTE — Patient Instructions (Signed)
Infeccin por hongos en las uas (Fungal Nail Infection) La infeccin por hongos en las uas es una infeccin por hongos frecuente en las uas de los pies o de las manos. Este trastorno [employer] uas de los pies con ms frecuencia que las uas de las manos. Ms de Ardelia Mems ua puede infectarse. Esta afeccin puede transmitirse de [place] persona a otra (es  contagiosa). CAUSAS La causa de esta afeccin es un hongo. Existen distintos tipos de hongos que pueden causar la infeccin. Estos hongos son ms frecuentes en las zonas hmedas y clidas. Si las manos o los pies entran en contacto con los hongos, se pueden introducir en una ruptura de las uas de las manos o de los pies y Immunologist infeccin. [place] hacer que usted sea propenso a sufrir esta afeccin:  Ser varn.  Tener diabetes.  Ser Ardelia Mems persona de edad avanzada.  Convivir con alguien que tiene hongos.  Caminar descalzo en zonas donde proliferan hongos, como duchas o vestuarios.  Tener mala circulacin.  Usar zapatos y calcetines que [employer].  Tener pie de atleta.  Tener una ua lastimada o antecedentes recientes de una ciruga de uas.  Tener psoriasis.  Debilitamiento del sistema de defensa del cuerpo (sistema inmunitario). SNTOMAS Los sntomas de esta afeccin incluyen lo siguiente:  Etta Grandchild plida sobre la ua.  Engrosamiento de la ua.  Una ua que se torna amarilla o [place].  Luis Llorens Torres uas rugosos o quebradizos.  Una ua que se cae.  Una ua que se ha desprendido del lecho ungueal. DIAGNSTICO Esta afeccin se diagnostica mediante un examen fsico. El mdico podr tomar una muestra de la ua para examinarla y Hydrographic surveyor si tiene hongos. TRATAMIENTO Las infecciones leves no necesitan tratamiento. Si tiene [employer] uas, el tratamiento puede incluir lo siguiente:  Medicamentos antimicticos por va oral. Deber tomar los  medicamentos durante algunas semanas o meses y no ver los resultados hasta despus de un largo [place]. Estos medicamentos pueden tener efectos secundarios. Consulte al [employer] a los que debe estar atento.  Cremas y esmaltes para uas antimicticos. Se pueden usar junto con los medicamentos antimicticos que se administran por va oral.  Tratamiento lser de las uas.  Ciruga para extirpar la ua. Esto puede ser [employer] casos ms graves de infecciones. El tratamiento es muy [place] y la infeccin Psychologist, sport and exercise. INSTRUCCIONES PARA EL CUIDADO EN EL HOGAR Medicamentos  Tome o aplquese los medicamentos de venta libre y TEFL teacher se lo haya indicado el mdico.  Consulte al mdico sobre el uso de pomadas mentoladas para las uas de [place]. Estilo de vida  No comparta elementos personales como toallas o cortauas.  Crtese las uas con frecuencia.  Lvese y [place] y los pies todos [place].  Use calcetines absorbentes y cmbiese los calcetines con frecuencia.  Use un tipo de calzado que permita que el aire [place], como sandalias o zapatillas de lona. Deseche los zapatos viejos.  Use guantes de goma si est trabajando con sus manos en lugares mojados.  No camine descalzo en duchas o vestuarios.  No concurra a un saln de cosmtica de uas si no usan instrumentos limpios.  No use uas artificiales. Instrucciones generales  Concurra a todas las visitas de control como se lo haya indicado el mdico. Esto es importante.  Aplquese polvo antimictico en los pies y en los zapatos. SOLICITE ATENCIN  MDICA SI: La infeccin no mejora o si empeora despus de varios meses. Esta informacin no tiene Theme park managercomo fin reemplazar el consejo del mdico. Asegrese de hacerle al mdico cualquier pregunta que tenga. Document Released: 03/06/2005 Document Revised: 09/18/2015 Document Reviewed: 11/28/2014 Elsevier Interactive Patient Education   2018 [employer] Codo de Chartered loss adjustertenista (Tennis Elbow) El codo de tenista es la hinchazn (inflamacin) de los tendones externos del antebrazo cerca del codo. Los tendones Automatic Dataunen los msculos con los Commackhuesos. Esta afeccin puede presentarse si practica un deporte o realiza una tarea que exige el uso excesivo del codo. La causa del codo de Bulgariatenista es la repeticin del mismo movimiento una y Laverda Pageotra vez. El codo de Bulgariatenista puede causar lo siguiente:  Dolor y sensibilidad a la palpacin en el Product managerantebrazo y la parte externa del codo.  Sensacin de ardor que se extiende desde el codo Scientist, research (physical sciences)hasta el brazo.  Agarre dbil de las manos. CUIDADOS EN EL HOGAR Actividad  Ponga el codo y Warden/rangerla mueca en reposo como se lo haya indicado el mdico. Intente no realizar ninguna actividad que pueda haber causado el problema hasta que el mdico le permita reanudarla.  Si un fisioterapeuta le ensea ejercicios, hgalos como se lo hayan indicado.  Si levanta un objeto, hgalo con la palma de la mano Pineyhacia arriba. Liberty GlobalDe este modo, se reduce el esfuerzo en el codo. Estilo de vida  Si el codo de Bulgariatenista se debe a los deportes, revise el equipo y Manufacturing engineerasegrese de lo siguiente: ? Que lo Botswanausa correctamente. ? Que es apto para usted.  Si el codo de Bulgariatenista se debe al Aleen Campitrabajo, tmese descansos con frecuencia, si es posible. Hable con el gerente respecto de hacer su trabajo de un modo que sea seguro para usted. ? Si el codo de Bulgariatenista se debe al uso de la computadora, hable con el gerente United Stationerssobre los cambios que pueden hacerse en su lugar de New Uniontrabajo. Instrucciones generales  Si se lo indican, aplique hielo sobre la zona dolorida: ? Nature conservation officeronga el hielo en una bolsa plstica. ? Coloque una FirstEnergy Corptoalla entre la piel y la bolsa de hielo. ? Coloque el hielo durante 20minutos, 2 a 3veces por da.  Tome los medicamentos solamente como se lo haya indicado el mdico.  Si le aconsejaron usar un dispositivo ortopdico, selo como se lo haya indicado el  mdico.  Concurra a todas las visitas de control como se lo haya indicado el mdico. Esto es importante. SOLICITE AYUDA SI:  El dolor no mejora con Scientist, research (medical)el tratamiento.  El dolor Viennaempeora.  Tiene debilidad en el antebrazo, la mano o los dedos de la [place].  No puede sentir el antebrazo, la mano o los dedos de la [place]. Esta informacin no tiene Theme park managercomo fin reemplazar el consejo del mdico. Asegrese de hacerle al mdico cualquier pregunta que tenga. Document Released: 06/29/2010 Document Revised: 10/11/2014 Document Reviewed: 05/23/2014 Elsevier Interactive Patient Education  [employer]2018 Elsevier Inc.

## 2018-02-04 ENCOUNTER — Ambulatory Visit: Payer: Self-pay | Attending: Family Medicine

## 2018-02-23 MED FILL — TERBINAFINE HCL 250 MG TAB: 250 | 30 days supply | Qty: 30 | Fill #1

## 2018-04-01 ENCOUNTER — Ambulatory Visit: Payer: Self-pay | Attending: Family Medicine | Admitting: Physician Assistant

## 2018-04-01 VITALS — BP 134/81 | HR 68 | Temp 98.4°F | Resp 16 | Wt 165.0 lb

## 2018-04-01 DIAGNOSIS — Z789 Other specified health status: Secondary | ICD-10-CM

## 2018-04-01 DIAGNOSIS — H7292 Unspecified perforation of tympanic membrane, left ear: Secondary | ICD-10-CM | POA: Insufficient documentation

## 2018-04-01 DIAGNOSIS — Z79899 Other long term (current) drug therapy: Secondary | ICD-10-CM | POA: Insufficient documentation

## 2018-04-01 DIAGNOSIS — H6692 Otitis media, unspecified, left ear: Secondary | ICD-10-CM | POA: Insufficient documentation

## 2018-04-01 DIAGNOSIS — E7800 Pure hypercholesterolemia, unspecified: Secondary | ICD-10-CM | POA: Insufficient documentation

## 2018-04-01 DIAGNOSIS — Z886 Allergy status to analgesic agent status: Secondary | ICD-10-CM | POA: Insufficient documentation

## 2018-04-01 MED ORDER — AMOXICILLIN-POT CLAVULANATE 875-125 MG PO TABS: 1.0000 | ORAL_TABLET | Freq: Two times a day (BID) | ORAL | 0 refills | Status: DC

## 2018-04-01 MED FILL — AMOX-CLAV 875-125 MG TABLET: 875-125 | 10 days supply | Qty: 20 | Fill #0

## 2018-04-01 NOTE — Progress Notes (Signed)
Left ear pain x 1 week  Clear drainage from left ear  Request to have  Cholesterol check- has not eating    [id number]

## 2018-04-01 NOTE — Progress Notes (Signed)
Patient ID: Andrew Sandoval, male   DOB: 1980-12-20, 37 y.o.   MRN: [id number]      Andrew Sandoval, is a 37 y.o. male  [id number]  [id number]  DOB - 09/01/1980  Subjective:  Chief Complaint and HPI: [name] is a 37 y.o. male here today [place] with [place] interpreters translating.  L ear pain X 1 week.  No fever.  Pain increased all week then on Friday, ear started draining yellowish fluid and he has less pain now.  No URI S/sx.    ROS:   Constitutional:  No f/c, No night sweats, No unexplained weight loss. EENT:  No vision changes, No blurry vision, +decreased hearing on L. No mouth, throat problems.  Respiratory: No cough, No SOB Cardiac: No CP, no palpitations GI:  No abd pain, No N/V/D. GU: No Urinary s/sx Musculoskeletal: No joint pain Neuro: No headache, no dizziness, no motor weakness.  Skin: No rash Endocrine:  No polydipsia. No polyuria.  Psych: Denies SI/HI  No problems updated.  ALLERGIES: Allergies  Allergen Reactions  . Tylenol [Acetaminophen] Hives    PAST MEDICAL HISTORY: Past Medical History:  Diagnosis Date  . Cocaine use 2006-2010    MEDICATIONS AT HOME: Prior to Admission medications   Medication Sig Start Date End Date Taking? Authorizing Provider  amoxicillin-clavulanate (AUGMENTIN) 875-125 MG tablet Take 1 tablet by mouth 2 (two) times daily. 04/01/18   Anders Simmonds, PA-C  atorvastatin (LIPITOR) 20 MG tablet Take 1 tablet (20 mg total) by mouth daily. Patient not taking: Reported on 10/09/2017 06/09/17   Hoy Register, MD  clotrimazole-betamethasone (LOTRISONE) cream Apply 1 application topically 2 (two) times daily. Patient not taking: Reported on 04/01/2018 01/15/18   Anders Simmonds, PA-C  naproxen (NAPROSYN) 500 MG tablet Take 1 tablet (500 mg total) by mouth 2 (two) times daily with a meal. X 2 weeks then prn pain Patient not taking: Reported on 04/01/2018 01/15/18   Anders Simmonds, PA-C  terbinafine (LAMISIL)  250 MG tablet Take 1 tablet (250 mg total) by mouth daily. For toenail fungus. Patient not taking: Reported on 04/01/2018 01/15/18   Anders Simmonds, PA-C     Objective:  EXAM:   Vitals:   04/01/18 1040  BP: 134/81  Pulse: 68  Resp: 16  Temp: 98.4 F (36.9 C)  SpO2: 97%  Weight: 165 lb (74.8 kg)    General appearance : A&OX3. NAD. Non-toxic-appearing HEENT: Atraumatic and Normocephalic.  PERRLA. EOM intact.  TM and canal on R WNL.  L TM appears to have ruptured and sealed back over.  Superior portion is retracted.  Inferior aspect with cloudy and yellowish fluid.   Mouth-MMM, post pharynx WNL w/o erythema, No PND. Neck: supple, no JVD. No cervical lymphadenopathy. No thyromegaly Chest/Lungs:  Breathing-non-labored, Good air entry bilaterally, breath sounds normal without rales, rhonchi, or wheezing  CVS: S1 S2 regular, no murmurs, gallops, rubs  Extremities: Bilateral Lower Ext shows no edema, both legs are warm to touch with = pulse throughout Neurology:  CN II-XII grossly intact, Non focal.   Psych:  TP linear. J/I WNL. Normal speech. Appropriate eye contact and affect.  Skin:  No Rash  Data Review Lab Results  Component Value Date   HGBA1C 5.4 09/25/2016   HGBA1C 5.50 01/18/2015     Assessment & Plan   1. Otitis media of left ear with spontaneous rupture of tympanic membrane Do not use anything in ear(he has been using peroxide). advil or tylenol  if painful - amoxicillin-clavulanate (AUGMENTIN) 875-125 MG tablet; Take 1 tablet by mouth 2 (two) times daily.  Dispense: 20 tablet; Refill: 0 - Ambulatory referral to ENT(urgent/asap)  2. Pure hypercholesterolemia He has been on meds for 4-5 months.  He is fasting this morning.   - Lipid panel  3. Language barrier stratus interpreters used and additional time performing visit was required.      Patient have been counseled extensively about nutrition and exercise  Return for keep 11/13 app with Dr Alvis Lemmings.  The  patient was given clear instructions to go to ER or return to medical center if symptoms don't improve, worsen or new problems develop. The patient verbalized understanding. The patient was told to call to get lab results if they haven't heard anything in the next week.     Georgian Co, PA-C [facility] [place], [place] [phone]   04/01/2018, 11:00 AM

## 2018-04-01 NOTE — Patient Instructions (Signed)
Secreción del oído  (Ear Drainage)  La secreción del oído es la supuración de cerumen, pus, sangre u otros líquidos del oído.  INSTRUCCIONES PARA EL CUIDADO EN EL HOGAR  Esté atento a cualquier cambio en la secreción del oído. Tome estas medidas para controlar la afección:  · Tome los medicamentos de venta libre y los recetados solamente como se lo haya indicado el médico.  · No use hisopos con punta de algodón en el oído. No se coloque ningún otro objeto en el oído.  · No practique natación hasta que el médico lo autorice.  · Antes de ducharse, embeba una bolita de algodón en vaselina y colóquesela en el oído. Esto ayuda a evitar que le entre agua en el oído.  · Evite la exposición al humo.  · Lávese las manos antes y después de tocarse los oídos.  · Concurra a todas las visitas de control como se lo haya indicado el médico. Esto es importante.  SOLICITE ATENCIÓN MÉDICA SI:  · Aumenta la secreción.  · Siente dolor de oídos.  · Tiene fiebre.  · La secreción no mejora con tratamiento.  · La secreción del oído es sanguinolenta, blanca, transparente o amarilla.  · El oído está enrojecido o inflamado.  SOLICITE ATENCIÓN MÉDICA DE INMEDIATO SI:  · Tiene dolor intenso de oídos.  · Sufre un dolor intenso de cabeza.  · Vomita.  · Siente mareos.  · Tiene una convulsión.  · Tiene síntomas nuevos de pérdida de la audición.  Esta información no tiene como fin reemplazar el consejo del médico. Asegúrese de hacerle al médico cualquier pregunta que tenga.  Document Released: 05/27/2005 Document Revised: 02/15/2015 Document Reviewed: 08/30/2014  Elsevier Interactive Patient Education © 2018 Elsevier Inc.

## 2018-04-02 ENCOUNTER — Other Ambulatory Visit: Payer: Self-pay | Admitting: Physician Assistant

## 2018-04-02 DIAGNOSIS — E7800 Pure hypercholesterolemia, unspecified: Secondary | ICD-10-CM

## 2018-04-02 LAB — LIPID PANEL
CHOL/HDL RATIO: 7 ratio — AB (ref 0.0–5.0)
Cholesterol, Total: 260 mg/dL — ABNORMAL HIGH (ref 100–199)
HDL: 37 mg/dL — AB (ref >39)
LDL CALC: 175 mg/dL — AB (ref 0–99)
Triglycerides: 239 mg/dL — ABNORMAL HIGH (ref 0–149)
VLDL CHOLESTEROL CAL: 48 mg/dL — AB (ref 5–40)

## 2018-04-02 MED ORDER — ATORVASTATIN CALCIUM 20 MG PO TABS: 20.0000 mg | ORAL_TABLET | Freq: Every day | ORAL | 5 refills | Status: DC

## 2018-04-02 MED FILL — ATORVASTATIN 20 MG TABLET: 20 | 30 days supply | Qty: 30 | Fill #0

## 2018-04-10 ENCOUNTER — Telehealth: Payer: Self-pay | Admitting: Family Medicine

## 2018-04-10 NOTE — Telephone Encounter (Signed)
Patient called requesting lab results, please follow up °

## 2018-04-13 NOTE — Telephone Encounter (Addendum)
Pt came in to request his lab results, he verified his DOB and received note left by provider for 04/01/2018. Nurse Leanord Hawking reviewed note prior to disclosing. Pt understood and had no further questions or concerns.

## 2018-04-22 ENCOUNTER — Ambulatory Visit: Payer: Self-pay | Attending: Family Medicine | Admitting: Family Medicine

## 2018-04-22 ENCOUNTER — Encounter: Payer: Self-pay | Admitting: Family Medicine

## 2018-04-22 VITALS — BP 133/76 | HR 57 | Temp 97.7°F | Ht 64.0 in | Wt 164.2 lb

## 2018-04-22 DIAGNOSIS — Z23 Encounter for immunization: Secondary | ICD-10-CM

## 2018-04-22 DIAGNOSIS — Z886 Allergy status to analgesic agent status: Secondary | ICD-10-CM | POA: Insufficient documentation

## 2018-04-22 DIAGNOSIS — E7800 Pure hypercholesterolemia, unspecified: Secondary | ICD-10-CM

## 2018-04-22 DIAGNOSIS — G5601 Carpal tunnel syndrome, right upper limb: Secondary | ICD-10-CM

## 2018-04-22 DIAGNOSIS — H9202 Otalgia, left ear: Secondary | ICD-10-CM | POA: Insufficient documentation

## 2018-04-22 DIAGNOSIS — Z79899 Other long term (current) drug therapy: Secondary | ICD-10-CM | POA: Insufficient documentation

## 2018-04-22 DIAGNOSIS — Z09 Encounter for follow-up examination after completed treatment for conditions other than malignant neoplasm: Secondary | ICD-10-CM | POA: Insufficient documentation

## 2018-04-22 MED ORDER — PREDNISONE 20 MG PO TABS: 20.0000 mg | ORAL_TABLET | Freq: Every day | ORAL | 0 refills | Status: DC

## 2018-04-22 MED ORDER — ATORVASTATIN CALCIUM 40 MG PO TABS: 40.0000 mg | ORAL_TABLET | Freq: Every day | ORAL | 6 refills | Status: DC

## 2018-04-22 MED FILL — ATORVASTATIN CALCIUM 40 MG: 40 | 30 days supply | Qty: 30 | Fill #0

## 2018-04-22 MED FILL — predniSONE 20 MG TABS: 20 | 5 days supply | Qty: 5 | Fill #0

## 2018-04-22 NOTE — Progress Notes (Signed)
Subjective:  Patient ID: Andrew Sandoval, male    DOB: 02-09-1981  Age: 37 y.o. MRN: [id number]  CC: Ear Pain (LEFT)   HPI Andrew Sandoval is a 37 year old male with a history of hypercholesterolemia who presents today for follow-up visit. He was seen by the PA 3 weeks ago for otitis media of left ear and treated with Augmentin which he has completed and reports resolution of left ear symptoms however he complains of right hand numbness which started this morning.  Prior to this episode he had one 3 months ago and symptoms sometime involve his right upper arm and forearm.  At his job he washes dishes frequently but denies loss of hand strength, pain. He is right-handed  Past Medical History:  Diagnosis Date  . Cocaine use 2006-2010    Past Surgical History:  Procedure Laterality Date  . No prior surgery      Allergies  Allergen Reactions  . Tylenol [Acetaminophen] Hives     Outpatient Medications Prior to Visit  Medication Sig Dispense Refill  . atorvastatin (LIPITOR) 20 MG tablet Take 1 tablet (20 mg total) by mouth daily. 30 tablet 5  . amoxicillin-clavulanate (AUGMENTIN) 875-125 MG tablet Take 1 tablet by mouth 2 (two) times daily. (Patient not taking: Reported on 04/22/2018) 20 tablet 0  . clotrimazole-betamethasone (LOTRISONE) cream Apply 1 application topically 2 (two) times daily. (Patient not taking: Reported on 04/01/2018) 30 g 0  . naproxen (NAPROSYN) 500 MG tablet Take 1 tablet (500 mg total) by mouth 2 (two) times daily with a meal. X 2 weeks then prn pain (Patient not taking: Reported on 04/01/2018) 60 tablet 0  . terbinafine (LAMISIL) 250 MG tablet Take 1 tablet (250 mg total) by mouth daily. For toenail fungus. (Patient not taking: Reported on 04/01/2018) 90 tablet 0   No facility-administered medications prior to visit.     ROS Review of Systems  Constitutional: Negative for activity change and appetite change.  HENT: Negative for sinus pressure and  sore throat.   Eyes: Negative for visual disturbance.  Respiratory: Negative for cough, chest tightness and shortness of breath.   Cardiovascular: Negative for chest pain and leg swelling.  Gastrointestinal: Negative for abdominal distention, abdominal pain, constipation and diarrhea.  Endocrine: Negative.   Genitourinary: Negative for dysuria.  Musculoskeletal: Negative for joint swelling and myalgias.  Skin: Negative for rash.  Allergic/Immunologic: Negative.   Neurological: Positive for numbness. Negative for weakness and light-headedness.  Psychiatric/Behavioral: Negative for dysphoric mood and suicidal ideas.    Objective:  BP 133/76   Pulse (!) 57   Temp 97.7 F (36.5 C) (Oral)   Ht 5\' 4"  (1.626 m)   Wt 164 lb 3.2 oz (74.5 kg)   SpO2 100%   BMI 28.18 kg/m   BP/Weight 04/22/2018 04/01/2018 01/15/2018  Systolic BP 133 134 115  Diastolic BP 76 81 72  Wt. (Lbs) 164.2 165 162.6  BMI 28.18 28.32 27.91      Physical Exam  Constitutional: He is oriented to person, place, and time. He appears well-developed and well-nourished.  HENT:  Left ear-cerumen in eustachian tube, residual purulent material obscuring TM Right ear-normal  Cardiovascular: Normal rate, normal heart sounds and intact distal pulses.  No murmur heard. Pulmonary/Chest: Effort normal and breath sounds normal. He has no wheezes. He has no rales. He exhibits no tenderness.  Abdominal: Soft. Bowel sounds are normal. He exhibits no distension and no mass. There is no tenderness.  Musculoskeletal: Normal range of motion.  Negative Phalen and Tinel signs  Neurological: He is alert and oriented to person, place, and time.  Skin: Skin is warm and dry.  Psychiatric: He has a normal mood and affect.      CMP Latest Ref Rng & Units 10/09/2017 06/06/2017 04/22/2017  Glucose 65 - 99 mg/dL 89 90 85  BUN 6 - 20 mg/dL 16 16 16(X22(H)  Creatinine 0.76 - 1.27 mg/dL 0.961.05 0.450.94 4.090.95  Sodium 134 - 144 mmol/L 140 145(H) 142    Potassium 3.5 - 5.2 mmol/L 4.1 4.5 4.2  Chloride 96 - 106 mmol/L 101 106 102  CO2 20 - 29 mmol/L 20 21 25   Calcium 8.7 - 10.2 mg/dL 9.5 9.5 9.7  Total Protein 6.0 - 8.5 g/dL - 7.5 -  Total Bilirubin 0.0 - 1.2 mg/dL - 0.7 -  Alkaline Phos 39 - 117 IU/L - 99 -  AST 0 - 40 IU/L - 25 -  ALT 0 - 44 IU/L - 44 -    Lipid Panel     Component Value Date/Time   CHOL 260 (H) 04/01/2018 1140   TRIG 239 (H) 04/01/2018 1140   HDL 37 (L) 04/01/2018 1140   CHOLHDL 7.0 (H) 04/01/2018 1140   LDLCALC 175 (H) 04/01/2018 1140    Assessment & Plan:   1. Pure hypercholesterolemia Uncontrolled but he had been out of his medications for about 4 months prior to your labs drawn Low-cholesterol diet Increased dose of Lipitor given previously uncontrolled cholesterol - atorvastatin (LIPITOR) 40 MG tablet; Take 1 tablet (40 mg total) by mouth daily.  Dispense: 30 tablet; Refill: 6  2. Carpal tunnel syndrome of right wrist His job with repetitive right hand motion puts him at risk for this Advised to obtain a wrist brace - predniSONE (DELTASONE) 20 MG tablet; Take 1 tablet (20 mg total) by mouth daily with breakfast.  Dispense: 5 tablet; Refill: 0   Meds ordered this encounter  Medications  . atorvastatin (LIPITOR) 40 MG tablet    Sig: Take 1 tablet (40 mg total) by mouth daily.    Dispense:  30 tablet    Refill:  6    Discontinue previous dose  . predniSONE (DELTASONE) 20 MG tablet    Sig: Take 1 tablet (20 mg total) by mouth daily with breakfast.    Dispense:  5 tablet    Refill:  0    Follow-up: Return in about 6 months (around 10/21/2018) for follow up of chronic medical conditions.   Hoy RegisterEnobong Nhia Heaphy MD

## 2018-04-22 NOTE — Patient Instructions (Signed)
Sndrome del tnel carpiano (Carpal Tunnel Syndrome) El sndrome del tnel carpiano es una afeccin que causa dolor en la mano y en el brazo. El tnel carpiano es un espacio estrecho ubicado en el lado palmar de la mueca. Los movimientos repetidos de la mueca o determinadas enfermedades pueden causar la hinchazn del tnel. Esta hinchazn puede comprimir el nervio principal de la mueca (nervio mediano). CUIDADOS EN EL HOGAR Si tiene una frula:  sela como se lo haya indicado el mdico. Qutesela solamente como se lo haya indicado el mdico.  Afloje la frula si los dedos: ? Se le adormecen y siente hormigueos. ? Se le ponen azulados y fros.  Mantenga la frula limpia y seca. Instrucciones generales  Tome los medicamentos de venta libre y los recetados solamente como se lo haya indicado el mdico.  Haga reposar la mueca de toda actividad que le cause dolor. Si es necesario, hable con su empleador sobre los cambios que pueden hacerse en su lugar de trabajo, por ejemplo, usar una almohadilla para apoyar la mueca mientras tipea.  Si se lo indican, aplique hielo sobre la zona dolorida: ? Ponga el hielo en una bolsa plstica. ? Coloque una toalla entre la piel y la bolsa de hielo. ? Coloque el hielo durante 20minutos, 2 a 3veces por da.  Concurra a todas las visitas de control como se lo haya indicado el mdico. Esto es importante.  Haga los ejercicios como se lo hayan indicado el mdico, el fisioterapeuta o el terapeuta ocupacional. SOLICITE AYUDA SI:  Aparecen nuevos sntomas.  Los medicamentos no le alivian el dolor.  Los sntomas empeoran. Esta informacin no tiene como fin reemplazar el consejo del mdico. Asegrese de hacerle al mdico cualquier pregunta que tenga. Document Released: 05/16/2011 Document Revised: 02/15/2015 Document Reviewed: 10/12/2014 Elsevier Interactive Patient Education  2018 Elsevier Inc.  

## 2018-05-21 ENCOUNTER — Ambulatory Visit (INDEPENDENT_AMBULATORY_CARE_PROVIDER_SITE_OTHER): Payer: Self-pay | Admitting: Otolaryngology

## 2018-05-21 DIAGNOSIS — H60332 Swimmer's ear, left ear: Secondary | ICD-10-CM

## 2018-05-21 DIAGNOSIS — H903 Sensorineural hearing loss, bilateral: Secondary | ICD-10-CM

## 2018-05-21 DIAGNOSIS — H900 Conductive hearing loss, bilateral: Secondary | ICD-10-CM

## 2018-05-21 DIAGNOSIS — H7202 Central perforation of tympanic membrane, left ear: Secondary | ICD-10-CM

## 2018-06-18 ENCOUNTER — Ambulatory Visit (INDEPENDENT_AMBULATORY_CARE_PROVIDER_SITE_OTHER): Payer: Self-pay | Admitting: Otolaryngology

## 2018-06-18 DIAGNOSIS — H608X3 Other otitis externa, bilateral: Secondary | ICD-10-CM

## 2018-06-18 DIAGNOSIS — H903 Sensorineural hearing loss, bilateral: Secondary | ICD-10-CM

## 2018-08-07 ENCOUNTER — Ambulatory Visit: Payer: Self-pay | Attending: Family Medicine

## 2018-08-19 ENCOUNTER — Ambulatory Visit: Payer: Self-pay | Attending: Family Medicine | Admitting: Physician Assistant

## 2018-08-19 ENCOUNTER — Other Ambulatory Visit: Payer: Self-pay

## 2018-08-19 VITALS — BP 118/77 | HR 55 | Temp 98.0°F | Resp 16 | Wt 162.6 lb

## 2018-08-19 DIAGNOSIS — G5601 Carpal tunnel syndrome, right upper limb: Secondary | ICD-10-CM

## 2018-08-19 DIAGNOSIS — B353 Tinea pedis: Secondary | ICD-10-CM

## 2018-08-19 DIAGNOSIS — E7800 Pure hypercholesterolemia, unspecified: Secondary | ICD-10-CM

## 2018-08-19 MED ORDER — CLOTRIMAZOLE-BETAMETHASONE 1-0.05 % EX CREA: 1.0000 "application " | TOPICAL_CREAM | Freq: Two times a day (BID) | CUTANEOUS | 2 refills | Status: DC

## 2018-08-19 MED ORDER — ATORVASTATIN CALCIUM 40 MG PO TABS: 40.0000 mg | ORAL_TABLET | Freq: Every day | ORAL | 6 refills | Status: DC

## 2018-08-19 MED FILL — ATORVASTATIN 40 MG TABLET: 40 | 30 days supply | Qty: 30 | Fill #0

## 2018-08-19 MED FILL — CLOTRIMAZOLE-BETAMETHASONE: 1-0.05 | 15 days supply | Qty: 30 | Fill #0

## 2018-08-19 NOTE — Patient Instructions (Signed)
Pie de atleta Athlete's Foot  El pie de atleta (tinea pedis) es una infeccin por hongos en la piel de los pies. Generalmente se produce en la piel que se encuentra entre los dedos o debajo de ellos. Tambin puede aparecer en la planta de los pies. Los sntomas incluyen picazn o zonas blancas y escamosas en la piel. La infeccin puede transmitirse de Neomia Dear persona a otra (es contagiosa). Tambin puede propagarse cuando los pies descalzos de una persona entran en contacto con el hongo que est en el piso de la ducha o sobre artculos como zapatos. Siga estas indicaciones en su casa: Medicamentos  Aplquese o tome los medicamentos de venta libre y los recetados solamente como se lo haya indicado el mdico.  Aplique o tome el medicamento antimictico como se lo haya indicado el mdico. No deje de usar el medicamento aunque comience a sentir que los pies estn mejor. Cuidados de los pies  No se rasque los pies.  Mantenga los pies secos: ? Use calcetines de algodn o lana. Cmbiese los calcetines [employer] o si se mojan. ? Use un tipo de calzado que permita que el aire [place], como sandalias o zapatillas de lona.  Lvese y squese los pies: ? [employer] o como se lo haya indicado el mdico. ? Despus de [employer] Corporation fsica. ? Sin olvidar la zona [employer] dedos. Instrucciones generales  No comparta ninguno de los siguientes objetos que entren en contacto con los pies: ? Toallas. ? Calzado. ? Alicates para las uas. ? Otros objetos de [employer].  Use sandalias cuando tenga que pisar zonas hmedas, como vestuarios y duchas compartidas.  Concurra a todas las visitas de [address] se lo haya indicado el mdico. Esto es importante.  Si tiene diabetes, mantenga bajo control el nivel de Banker. Comunquese con un mdico si:  Tiene fiebre.  Aumenta la hinchazn, el dolor, el calor o el enrojecimiento en el pie.  Los pies no mejoran con Scientist, research (medical).   Los sntomas empeoran.  Aparecen nuevos sntomas. Resumen  El pie de atleta es una infeccin por hongos en la piel de los pies.  Los sntomas incluyen picazn o zonas blancas y escamosas en la piel.  Aplique o tome el medicamento antimictico como se lo haya indicado el mdico.  Mantenga los pies, limpios y secos. Esta informacin no tiene Theme park manager el consejo del mdico. Asegrese de hacerle al mdico cualquier pregunta que tenga. Document Released: 06/29/2010 Document Revised: 05/19/2017 Document Reviewed: 05/19/2017 Elsevier Interactive Patient Education  2019 [employer].   Sndrome del tnel carpiano Carpal Tunnel Syndrome  El sndrome del tnel carpiano es una afeccin que causa dolor en la mano y en el brazo. El tnel carpiano es un rea estrecha que se encuentra en el lado palmar de la [place]. Los movimientos repetidos de la mueca o determinadas enfermedades pueden causar hinchazn en el tnel. Esta hinchazn puede comprimir el nervio principal de la mueca (nervio mediano). Cules son las causas? Esta afeccin puede ser causada por lo siguiente:  Movimientos repetidos de [employer].  Lesiones en la [place].  Artritis.  Un saco lleno de lquido (quiste) o un crecimiento anormal (tumor) en el tnel carpiano.  Acumulacin de lquido Academic librarian. Algunas veces, la causa no se conoce. Qu incrementa el riesgo? Los siguientes factores pueden hacer que sea ms propenso a Clinical cytogeneticist afeccin:  Tener un trabajo que exige mover la [place] del mismo modo [place]  veces. Esto incluye trabajos como el de carnicero o el de cajero.  Ser mujer.  Tener otras afecciones, por ejemplo: ? Diabetes. ? Obesidad. ? Una glndula tiroidea que no es lo suficientemente activa (hipotiroidismo). ? Insuficiencia renal. Cules son los signos o sntomas? Los sntomas de esta afeccin incluyen:  Sensacin de hormigueo en los dedos.  Hormigueo o prdida de la  sensibilidad (adormecimiento) en la mano.  Dolor en todo el brazo. Este dolor puede empeorar al flexionar la [place] y el codo durante [place].  Dolor en la mueca que sube por el brazo hasta el hombro.  Dolor que baja hasta la palma de la mano o los dedos.  Sensacin de [employer]. Puede resultarle difcil tomar y Personal assistant. Es posible que se sienta peor por la noche. Cmo se diagnostica? Esta afeccin se diagnostica mediante los antecedentes mdicos y un examen fsico. Tambin pueden hacerle estudios, por ejemplo:  Una electromiograma (EMG). Este estudio comprueba las seales que los nervios envan a los msculos.  Estudio de [employer]. Este estudio comprueba si las seales pasan correctamente por los nervios.  Estudios de diagnstico por imgenes, como radiografas, una ecografa y Hotel manager (RM). Estos estudios Education officer, community cul podra ser la causa de su afeccin. Cmo se trata? El tratamiento de esta afeccin puede incluir:  Cambios en el estilo de vida. Se le pedir que deje o cambie la actividad que caus el problema.  Hacer ejercicio y actividades para que los msculos y los huesos se vuelvan ms fuertes (fisioterapia).  Aprender a Hotel manager nuevamente (terapia ocupacional).  Medicamentos para tratar Chief Technology Officer y la hinchazn (inflamacin). Es posible que le apliquen inyecciones en la [place].  Una frula para la [place].  [place]. Siga estas indicaciones en su casa: Si tiene una frula:  Use la frula como se lo haya indicado el mdico. Quteselo solamente como se lo haya indicado el mdico.  Afloje la frula si los dedos: ? Hormiguean. ? Pierden la sensibilidad (se adormecen). ? Se tornan fros y de Edison International.  Mantenga la frula limpia.  Si la frula no es impermeable: ? No deje que se moje. ? Cbralo con un envoltorio hermtico cuando tome un bao de inmersin o [place]. Control del dolor, la  rigidez y la hinchazn   Si se lo indican, aplique hielo sobre la zona dolorida: ? Si tiene una frula desmontable, qutesela como se lo haya indicado el mdico. ? Ponga el hielo en una bolsa plstica. ? Coloque una FirstEnergy Corp piel y Copy. ? Coloque el hielo durante , 2a3veces al da. Indicaciones generales  Baxter International de venta libre y los recetados solamente como se lo haya indicado el mdico.  Descanse la [place] de cualquier actividad que le cause dolor. Si es necesario, hable con su jefe en el Standard Pacific cambios que pueden ayudar a la curacin de la [place].  Haga los ejercicios como se lo hayan indicado el mdico, el fisioterapeuta o el terapeuta ocupacional.  Concurra a todas las visitas de seguimiento como se lo haya indicado el mdico. Esto es importante. Comunquese con un mdico si:  Aparecen nuevos sntomas.  Los medicamentos no Tourist information centre manager.  Sus sntomas empeoran. Solicite ayuda de inmediato si:  Tiene adormecimiento u hormigueo muy intensos en la mueca o la [place]. Resumen  El sndrome del tnel carpiano es una afeccin que causa dolor en la mano y en el brazo.  Suele  deberse a movimientos repetidos de [employer] Inc.  Este problema se trata mediante cambios en el estilo de vida y medicamentos. La ciruga puede ser necesaria en casos muy graves.  Siga las indicaciones del mdico sobre el uso de una frula, el reposo de la [place], la asistencia a las consultas de control y Freight forwarder para pedir ayuda. Esta informacin no tiene Theme park manager el consejo del mdico. Asegrese de hacerle al mdico cualquier pregunta que tenga. Document Released: 05/16/2011 Document Revised: 10/30/2017 Document Reviewed: 10/30/2017 Elsevier Interactive Patient Education  2019 [employer].

## 2018-08-19 NOTE — Progress Notes (Signed)
Patient ID: Andrew Sandoval, male   DOB: 18-May-1981, 38 y.o.   MRN: [id number]   Andrew Sandoval, is a 38 y.o. male  SKA:[id number]  OMB:[id number]  DOB - 28-Jan-1981  Subjective:  Chief Complaint and HPI: Andrew Sandoval is a 38 y.o. male here today with several issues.  B hands falling asleep at night and waking him up feeling uncomfortable and slightly painful.    Wants to check cholesterol today.  Cholesterol has been very high but he didn't restart cholesterol medications as was recommended in October 2019.  Also c/o recurrent athlete's foot itching for about months.    Caisen with [employer] interpreters translating  ROS:   Constitutional:  No f/c, No night sweats, No unexplained weight loss. EENT:  No vision changes, No blurry vision, No hearing changes. No mouth, throat, or ear problems.  Respiratory: No cough, No SOB Cardiac: No CP, no palpitations GI:  No abd pain, No N/V/D. GU: No Urinary s/sx Musculoskeletal: B hand pain at night Neuro: No headache, no dizziness, no motor weakness.  Skin: +rash Endocrine:  No polydipsia. No polyuria.  Psych: Denies SI/HI  No problems updated.  ALLERGIES: Allergies  Allergen Reactions  . Tylenol [Acetaminophen] Hives    PAST MEDICAL HISTORY: Past Medical History:  Diagnosis Date  . Cocaine use 2006-2010    MEDICATIONS AT HOME: Prior to Admission medications   Medication Sig Start Date End Date Taking? Authorizing Provider  atorvastatin (LIPITOR) 40 MG tablet Take 1 tablet (40 mg total) by mouth daily. 08/19/18   Anders Simmonds, PA-C  clotrimazole-betamethasone (LOTRISONE) cream Apply 1 application topically 2 (two) times daily. X 1 month 08/19/18   Anders Simmonds, PA-C  naproxen (NAPROSYN) 500 MG tablet Take 1 tablet (500 mg total) by mouth 2 (two) times daily with a meal. X 2 weeks then prn pain Patient not taking: Reported on 04/01/2018 01/15/18   Anders Simmonds, PA-C  predniSONE (DELTASONE) 20 MG tablet Take 1  tablet (20 mg total) by mouth daily with breakfast. Patient not taking: Reported on 08/19/2018 04/22/18   Hoy Register, MD  terbinafine (LAMISIL) 250 MG tablet Take 1 tablet (250 mg total) by mouth daily. For toenail fungus. Patient not taking: Reported on 04/01/2018 01/15/18   Anders Simmonds, PA-C     Objective:  EXAM:   Vitals:   08/19/18 0910  BP: 118/77  Pulse: (!) 55  Resp: 16  Temp: 98 F (36.7 C)  TempSrc: Oral  SpO2: 97%  Weight: 162 lb 9.6 oz (73.8 kg)    General appearance : A&OX3. NAD. Non-toxic-appearing HEENT: Atraumatic and Normocephalic.  PERRLA. EOM intact.   Chest/Lungs:  Breathing-non-labored, Good air entry bilaterally, breath sounds normal without rales, rhonchi, or wheezing  CVS: S1 S2 regular, no murmurs, gallops, rubs  B hands/wrists with full S&ROM and normal grip.  +Phalen's, neg Tinel's.   Extremities: Bilateral Lower Ext shows no edema, both legs are warm to touch with = pulse throughout Neurology:  CN II-XII grossly intact, Non focal.   Psych:  TP linear. J/I WNL. Normal speech. Appropriate eye contact and affect.  Skin:  No Rash  Data Review Lab Results  Component Value Date   HGBA1C 5.4 09/25/2016   HGBA1C 5.50 01/18/2015     Assessment & Plan   1. Tinea pedis of right foot - clotrimazole-betamethasone (LOTRISONE) cream; Apply 1 application topically 2 (two) times daily. X 1 month  Dispense: 30 g; Refill: 2  2. Pure hypercholesterolemia Needs  to start meds - Lipid panel - atorvastatin (LIPITOR) 40 MG tablet; Take 1 tablet (40 mg total) by mouth daily.  Dispense: 30 tablet; Refill: 6  3. Carpal tunnel syndrome of right wrist B wrist splints recommended-esp at night - TSH - Basic metabolic panel     Patient have been counseled extensively about nutrition and exercise  Return in about 2 months (around 10/19/2018) for Dr Alvis Lemmings for increased cholesterol.  The patient was given clear instructions to go to ER or return to medical  center if symptoms don't improve, worsen or new problems develop. The patient verbalized understanding. The patient was told to call to get lab results if they haven't heard anything in the next week.     Georgian Co, PA-C [facility] and [facility] [place], [place] [phone]   08/19/2018, 9:31 AM

## 2018-08-20 LAB — BASIC METABOLIC PANEL
BUN/Creatinine Ratio: 14 (ref 9–20)
BUN: 14 mg/dL (ref 6–20)
CO2: 22 mmol/L (ref 20–29)
Calcium: 9.4 mg/dL (ref 8.7–10.2)
Chloride: 105 mmol/L (ref 96–106)
Creatinine, Ser: 0.97 mg/dL (ref 0.76–1.27)
GFR calc Af Amer: 114 mL/min/{1.73_m2} (ref >59)
GFR, EST NON AFRICAN AMERICAN: 99 mL/min/{1.73_m2} (ref >59)
Glucose: 95 mg/dL (ref 65–99)
Potassium: 4.2 mmol/L (ref 3.5–5.2)
Sodium: 140 mmol/L (ref 134–144)

## 2018-08-20 LAB — LIPID PANEL
CHOL/HDL RATIO: 7.2 ratio — AB (ref 0.0–5.0)
Cholesterol, Total: 275 mg/dL — ABNORMAL HIGH (ref 100–199)
HDL: 38 mg/dL — ABNORMAL LOW (ref >39)
LDL CALC: 173 mg/dL — AB (ref 0–99)
Triglycerides: 318 mg/dL — ABNORMAL HIGH (ref 0–149)
VLDL Cholesterol Cal: 64 mg/dL — ABNORMAL HIGH (ref 5–40)

## 2018-08-20 LAB — TSH: TSH: 2.14 u[IU]/mL (ref 0.450–4.500)

## 2018-08-25 ENCOUNTER — Telehealth: Payer: Self-pay

## 2018-08-25 NOTE — Telephone Encounter (Signed)
[employer] interpreters  Daniella Id# [id number]  contacted pt to go over lab results pt is aware and doesn't have any questions or concerns

## 2018-08-27 ENCOUNTER — Other Ambulatory Visit: Payer: Self-pay

## 2018-08-27 ENCOUNTER — Ambulatory Visit: Payer: Self-pay

## 2018-09-21 MED FILL — ATORVASTATIN CALCIUM 40 MG: 40 | 30 days supply | Qty: 30 | Fill #1

## 2018-10-07 ENCOUNTER — Other Ambulatory Visit: Payer: Self-pay

## 2018-10-07 ENCOUNTER — Ambulatory Visit: Payer: Self-pay | Attending: Family Medicine | Admitting: Family Medicine

## 2018-10-07 ENCOUNTER — Encounter: Payer: Self-pay | Admitting: Family Medicine

## 2018-10-07 DIAGNOSIS — R0981 Nasal congestion: Secondary | ICD-10-CM

## 2018-10-07 MED ORDER — FLUTICASONE PROPIONATE 50 MCG/ACT NA SUSP: 2.0000 | Freq: Every day | NASAL | 1 refills | Status: DC

## 2018-10-07 MED FILL — FLUTICASONE PROP 50 MCG SPR: 50 | 30 days supply | Qty: 16 | Fill #0

## 2018-10-07 NOTE — Progress Notes (Signed)
Patient has been called and DOB has been verified. Patient has been screened and transferred to PCP to start phone visit.  C/C: 2 week nasal congestion

## 2018-10-07 NOTE — Progress Notes (Signed)
Virtual Visit via Telephone Note  I connected with Andrew Sandoval, on 10/07/2018 at 10:09 AM by telephone and verified that I am speaking with the correct person using two identifiers.   Consent: I discussed the limitations, risks, security and privacy concerns of performing an evaluation and management service by telephone and the availability of in person appointments. I also discussed with the patient that there may be a patient responsible charge related to this service. The patient expressed understanding and agreed to proceed.   Location of Patient: Home  Location of Provider: Clinic   Persons participating in Telemedicine visit: Saied Boldt Farrington-CMA Dr. Nelwyn Salisbury Interpreter    History of Present Illness: Andrew Sandoval is a 38 year old male with a history of hypercholesterolemia who presents today for follow-up visit. He complains of a 2 to 3-week history of nasal congestion with associated nasal burning but denies sinus tenderness or pressure, denies postnasal drip, cough, fever, myalgias.  His symptoms prevent him from sleeping at night due to difficulty breathing. In the past he states he had received a medication which was administered through his nose and help flush out his nose with improvement in symptoms.   Past Medical History:  Diagnosis Date  . Cocaine use 2006-2010   Allergies  Allergen Reactions  . Tylenol [Acetaminophen] Hives    Current Outpatient Medications on File Prior to Visit  Medication Sig Dispense Refill  . atorvastatin (LIPITOR) 40 MG tablet Take 1 tablet (40 mg total) by mouth daily. (Patient not taking: Reported on 10/07/2018) 30 tablet 6  . clotrimazole-betamethasone (LOTRISONE) cream Apply 1 application topically 2 (two) times daily. X 1 month (Patient not taking: Reported on 10/07/2018) 30 g 2  . naproxen (NAPROSYN) 500 MG tablet Take 1 tablet (500 mg total) by mouth 2 (two) times daily with a meal. X 2 weeks  then prn pain (Patient not taking: Reported on 04/01/2018) 60 tablet 0  . predniSONE (DELTASONE) 20 MG tablet Take 1 tablet (20 mg total) by mouth daily with breakfast. (Patient not taking: Reported on 08/19/2018) 5 tablet 0  . terbinafine (LAMISIL) 250 MG tablet Take 1 tablet (250 mg total) by mouth daily. For toenail fungus. (Patient not taking: Reported on 04/01/2018) 90 tablet 0   No current facility-administered medications on file prior to visit.     Observations/Objective: Alert, awake, oriented x3 Not in acute distress  Assessment and Plan: 1. Sinus congestion Discussed saline nasal irrigation - fluticasone (FLONASE) 50 MCG/ACT nasal spray; Place 2 sprays into both nostrils daily.  Dispense: 16 g; Refill: 1   Follow Up Instructions: Return in about 6 months (around 04/08/2019) for follow up of chronic medical conditions.    I discussed the assessment and treatment plan with the patient. The patient was provided an opportunity to ask questions and all were answered. The patient agreed with the plan and demonstrated an understanding of the instructions.   The patient was advised to call back or seek an in-person evaluation if the symptoms worsen or if the condition fails to improve as anticipated.     I provided 10 minutes total of non-face-to-face time during this encounter including median intraservice time, reviewing previous notes, labs, imaging, medications and explaining diagnosis and management.     Hoy Register, MD, FAAFP. [facility] and [facility] [place], [place] [phone]   10/07/2018, 10:09 AM

## 2018-10-21 ENCOUNTER — Ambulatory Visit: Payer: Self-pay | Admitting: Family Medicine

## 2018-10-21 MED FILL — ?ATORVASTATIN 40MG TABLET: 40 | 90 days supply | Qty: 90 | Fill #2

## 2018-10-21 MED FILL — CLOTRIMAZOLE-BETAMETHASONE: 1-0.05 | 15 days supply | Qty: 30 | Fill #1

## 2018-11-18 ENCOUNTER — Telehealth: Payer: Self-pay | Admitting: Family Medicine

## 2018-11-18 NOTE — Telephone Encounter (Signed)
Pt was informed of the CAFA letter will be at the [facility]

## 2018-11-18 NOTE — Telephone Encounter (Signed)
Patient came in stating he would like to get a copy of his financial letter. Patient states he was told it was approved. Please follow up

## 2018-12-24 ENCOUNTER — Ambulatory Visit (INDEPENDENT_AMBULATORY_CARE_PROVIDER_SITE_OTHER): Payer: Self-pay | Admitting: Otolaryngology

## 2018-12-24 DIAGNOSIS — H6983 Other specified disorders of Eustachian tube, bilateral: Secondary | ICD-10-CM

## 2018-12-24 DIAGNOSIS — H9012 Conductive hearing loss, unilateral, left ear, with unrestricted hearing on the contralateral side: Secondary | ICD-10-CM

## 2018-12-24 DIAGNOSIS — H6123 Impacted cerumen, bilateral: Secondary | ICD-10-CM

## 2018-12-30 ENCOUNTER — Ambulatory Visit: Payer: Self-pay | Attending: Family Medicine | Admitting: Family Medicine

## 2018-12-30 ENCOUNTER — Encounter: Payer: Self-pay | Admitting: Family Medicine

## 2018-12-30 ENCOUNTER — Other Ambulatory Visit: Payer: Self-pay

## 2018-12-30 DIAGNOSIS — R252 Cramp and spasm: Secondary | ICD-10-CM

## 2018-12-30 DIAGNOSIS — E7800 Pure hypercholesterolemia, unspecified: Secondary | ICD-10-CM

## 2018-12-30 DIAGNOSIS — M7712 Lateral epicondylitis, left elbow: Secondary | ICD-10-CM

## 2018-12-30 MED ORDER — ATORVASTATIN CALCIUM 40 MG PO TABS: 40.0000 mg | ORAL_TABLET | Freq: Every day | ORAL | 6 refills | Status: DC

## 2018-12-30 MED ORDER — NAPROXEN 500 MG PO TABS: 500.0000 mg | ORAL_TABLET | Freq: Two times a day (BID) | ORAL | 1 refills | Status: DC

## 2018-12-30 MED FILL — ?NAPROXEN 500 MG TABS: 500 | 30 days supply | Qty: 60 | Fill #0

## 2018-12-30 NOTE — Progress Notes (Signed)
Virtual Visit via Telephone Note  I connected with Andrew Sandoval, on 12/30/2018 at 1:43 PM by telephone due to the COVID-19 pandemic and verified that I am speaking with the correct person using two identifiers.   Consent: I discussed the limitations, risks, security and privacy concerns of performing an evaluation and management service by telephone and the availability of in person appointments. I also discussed with the patient that there may be a patient responsible charge related to this service. The patient expressed understanding and agreed to proceed.   Location of Patient: Home  Location of Provider: Clinic   Persons participating in Telemedicine visit: Quay BurowJose Guzman Sandoval Alicia Farrington-CMA Dr. Louanna RawNewlin-PCP Gabrielle - Interpreter [phone]#[id number]    History of Present Illness: Andrew Sandoval is a 38 year old male with a history of hypercholesterolemia who presents today for follow-up visit.  He complains of cramps in his hands rated as mild to moderate for the last 2 months increased when he makes a fist and when asleep and sometimes associated with tingling.  Symptoms are absent at this time and he denies reduction in hand grip or strength. Compliant with his statin and his last lipid panel revealed uncontrolled hyperlipidemia.  Denies adverse effects from his Lipitor.  He does have a foot rash which has not responded to use of OTC creams. Past Medical History:  Diagnosis Date  . Cocaine use 2006-2010   Allergies  Allergen Reactions  . Tylenol [Acetaminophen] Hives    Current Outpatient Medications on File Prior to Visit  Medication Sig Dispense Refill  . atorvastatin (LIPITOR) 40 MG tablet Take 1 tablet (40 mg total) by mouth daily. 30 tablet 6  . clotrimazole-betamethasone (LOTRISONE) cream Apply 1 application topically 2 (two) times daily. X 1 month (Patient not taking: Reported on 10/07/2018) 30 g 2  . fluticasone (FLONASE) 50 MCG/ACT nasal spray Place 2  sprays into both nostrils daily. (Patient not taking: Reported on 12/30/2018) 16 g 1  . naproxen (NAPROSYN) 500 MG tablet Take 1 tablet (500 mg total) by mouth 2 (two) times daily with a meal. X 2 weeks then prn pain (Patient not taking: Reported on 04/01/2018) 60 tablet 0   No current facility-administered medications on file prior to visit.     Observations/Objective: Awake, alert, oriented x3 Not in acute distress  CMP Latest Ref Rng & Units 08/19/2018 10/09/2017 06/06/2017  Glucose 65 - 99 mg/dL 95 89 90  BUN 6 - 20 mg/dL 14 16 16   Creatinine 0.76 - 1.27 mg/dL 4.780.97 2.951.05 6.210.94  Sodium 134 - 144 mmol/L 140 140 145(H)  Potassium 3.5 - 5.2 mmol/L 4.2 4.1 4.5  Chloride 96 - 106 mmol/L 105 101 106  CO2 20 - 29 mmol/L 22 20 21   Calcium 8.7 - 10.2 mg/dL 9.4 9.5 9.5  Total Protein 6.0 - 8.5 g/dL - - 7.5  Total Bilirubin 0.0 - 1.2 mg/dL - - 0.7  Alkaline Phos 39 - 117 IU/L - - 99  AST 0 - 40 IU/L - - 25  ALT 0 - 44 IU/L - - 44    Lipid Panel     Component Value Date/Time   CHOL 275 (H) 08/19/2018 0949   TRIG 318 (H) 08/19/2018 0949   HDL 38 (L) 08/19/2018 0949   CHOLHDL 7.2 (H) 08/19/2018 0949   LDLCALC 173 (H) 08/19/2018 0949    Assessment and Plan: 1. Pure hypercholesterolemia Uncontrolled Counseled on low-cholesterol diet Lipid panel at next visit - atorvastatin (LIPITOR) 40 MG tablet; Take 1  tablet (40 mg total) by mouth daily.  Dispense: 30 tablet; Refill: 6  2. Hand cramp His symptoms are not specific-unable to determine if he has carpal tunnel syndrome Placed on NSAID and advised to use wrist brace  naproxen (NAPROSYN) 500 MG tablet; Take 1 tablet (500 mg total) by mouth 2 (two) times daily with a meal.  Dispense: 60 tablet; Refill: 1   Follow Up Instructions: Return in about 1 week (around 01/06/2019) for Foot rash.    I discussed the assessment and treatment plan with the patient. The patient was provided an opportunity to ask questions and all were answered. The  patient agreed with the plan and demonstrated an understanding of the instructions.   The patient was advised to call back or seek an in-person evaluation if the symptoms worsen or if the condition fails to improve as anticipated.     I provided 15 minutes total of non-face-to-face time during this encounter including median intraservice time, reviewing previous notes, labs, imaging, medications, management and patient verbalized understanding.     Charlott Rakes, MD, FAAFP. [facility] and [place], [place]   12/30/2018, 1:43 PM

## 2018-12-30 NOTE — Progress Notes (Signed)
Patient has been called and DOB has been verified. Patient has been screened and transferred to PCP to start phone visit.  Patient is having cramping in his hands,wrist and fingers.

## 2018-12-31 MED FILL — CLOTRIMAZOLE-BETAMETHASONE: 1-0.05 | 15 days supply | Qty: 30 | Fill #2

## 2019-01-14 ENCOUNTER — Ambulatory Visit: Payer: Self-pay

## 2019-01-25 ENCOUNTER — Ambulatory Visit: Payer: Self-pay | Admitting: Nurse Practitioner

## 2019-01-28 MED FILL — ATORVASTATIN CALCIUM 40 MG: 40 | 60 days supply | Qty: 60 | Fill #3

## 2019-02-01 MED FILL — ?NAPROXEN 500 MG TABET: 500 | 30 days supply | Qty: 60 | Fill #1

## 2019-02-17 ENCOUNTER — Other Ambulatory Visit: Payer: Self-pay

## 2019-02-17 ENCOUNTER — Ambulatory Visit: Payer: Self-pay | Attending: Nurse Practitioner | Admitting: Family Medicine

## 2019-02-17 ENCOUNTER — Encounter: Payer: Self-pay | Admitting: Family Medicine

## 2019-02-17 VITALS — BP 117/71 | HR 55 | Temp 98.6°F | Ht 63.0 in | Wt 167.0 lb

## 2019-02-17 DIAGNOSIS — R21 Rash and other nonspecific skin eruption: Secondary | ICD-10-CM

## 2019-02-17 DIAGNOSIS — G5603 Carpal tunnel syndrome, bilateral upper limbs: Secondary | ICD-10-CM

## 2019-02-17 MED ORDER — CYCLOBENZAPRINE HCL 10 MG PO TABS: 10.0000 mg | ORAL_TABLET | Freq: Every day | ORAL | 1 refills | Status: DC

## 2019-02-17 MED ORDER — NAPROXEN 500 MG PO TABS: 500.0000 mg | ORAL_TABLET | Freq: Two times a day (BID) | ORAL | 1 refills | Status: DC

## 2019-02-17 MED ORDER — GABAPENTIN 300 MG PO CAPS: 300.0000 mg | ORAL_CAPSULE | Freq: Every day | ORAL | 1 refills | Status: DC

## 2019-02-17 MED ORDER — CLOTRIMAZOLE-BETAMETHASONE 1-0.05 % EX CREA: 1.0000 "application " | TOPICAL_CREAM | Freq: Two times a day (BID) | CUTANEOUS | 1 refills | Status: DC

## 2019-02-17 MED FILL — CLOTRIMAZOLE-BETAMETHASONE: 1-0.05 | 15 days supply | Qty: 30 | Fill #0

## 2019-02-17 MED FILL — GABAPENTIN 300 MG CAPSULE: 300 | 30 days supply | Qty: 30 | Fill #0

## 2019-02-17 MED FILL — CYCLOBENZAPRINE 10 MG TAB: 10 | 30 days supply | Qty: 30 | Fill #0

## 2019-02-17 NOTE — Progress Notes (Signed)
Patient states that he get cramps in both hands.

## 2019-02-17 NOTE — Progress Notes (Signed)
Subjective:  Patient ID: Andrew Sandoval, male    DOB: Aug 05, 1980  Age: 38 y.o. MRN: [id number]  CC: Hand Pain   HPI Andrew Sandoval is a 38 year old male with a history of hyperlipidemia who presents today with complaints of 70-month history of bilateral wrist cramps with associated numbness, worse when he flexes his arms to sleep but also states this is present when his hands are extended. 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 He endorses waking up at night to shake his hands for relief.  Symptoms are absent at this time. He also has a chronic rash on the dorsum of his right foot for the last 4 years which has not responded to different topical creams.  Rash is sometimes pruritic and is not present in other body parts.  Past Medical History:  Diagnosis Date  . Cocaine use 2006-2010    Past Surgical History:  Procedure Laterality Date  . No prior surgery      Family History  Problem Relation Age of Onset  . Diabetes Neg Hx     Allergies  Allergen Reactions  . Tylenol [Acetaminophen] Hives    Outpatient Medications Prior to Visit  Medication Sig Dispense Refill  . atorvastatin (LIPITOR) 40 MG tablet Take 1 tablet (40 mg total) by mouth daily. 30 tablet 6  . naproxen (NAPROSYN) 500 MG tablet Take 1 tablet (500 mg total) by mouth 2 (two) times daily with a meal. 60 tablet 1  . fluticasone (FLONASE) 50 MCG/ACT nasal spray Place 2 sprays into both nostrils daily. (Patient not taking: Reported on 12/30/2018) 16 g 1  . clotrimazole-betamethasone (LOTRISONE) cream Apply 1 application topically 2 (two) times daily. X 1 month (Patient not taking: Reported on 10/07/2018) 30 g 2   No facility-administered medications prior to visit.      ROS Review of Systems  Constitutional: Negative for activity change and appetite change.  HENT: Negative for sinus pressure and sore throat.   Eyes: Negative for visual disturbance.  Respiratory: Negative for cough, chest  tightness and shortness of breath.   Cardiovascular: Negative for chest pain and leg swelling.  Gastrointestinal: Negative for abdominal distention, abdominal pain, constipation and diarrhea.  Endocrine: Negative.   Genitourinary: Negative for dysuria.  Musculoskeletal:       See HPI  Skin: Negative for rash.  Allergic/Immunologic: Negative.   Neurological: Positive for numbness. Negative for weakness and light-headedness.  Psychiatric/Behavioral: Negative for dysphoric mood and suicidal ideas.    Objective:  BP 117/71   Pulse (!) 55   Temp 98.6 F (37 C) (Oral)   Ht 5\' 3"  (1.6 m)   Wt 167 lb (75.8 kg)   SpO2 97%   BMI 29.58 kg/m   BP/Weight 02/17/2019 08/19/2018 69/62/9528  Systolic BP 413 244 010  Diastolic BP 71 77 76  Wt. (Lbs) 167 162.6 164.2  BMI 29.58 27.91 28.18      Physical Exam Constitutional:      Appearance: He is well-developed.  Cardiovascular:     Rate and Rhythm: Normal rate.     Heart sounds: Normal heart sounds. No murmur.  Pulmonary:     Effort: Pulmonary effort is normal.     Breath sounds: Normal breath sounds. No wheezing or rales.  Chest:     Chest wall: No tenderness.  Abdominal:     General: Bowel sounds are normal. There is no distension.     Palpations: Abdomen is soft. There is no mass.  Tenderness: There is no abdominal tenderness.  Musculoskeletal: Normal range of motion.  Skin:    Comments: Hyperpigmented cluster of raised rash on dorsum of right foot with surrounding hypopigmentation.  No vesicle noted  Neurological:     Mental Status: He is alert and oriented to person, place, and time.     CMP Latest Ref Rng & Units 08/19/2018 10/09/2017 06/06/2017  Glucose 65 - 99 mg/dL 95 89 90  BUN 6 - 20 mg/dL 14 16 16   Creatinine 0.76 - 1.27 mg/dL 1.610.97 0.961.05 0.450.94  Sodium 134 - 144 mmol/L 140 140 145(H)  Potassium 3.5 - 5.2 mmol/L 4.2 4.1 4.5  Chloride 96 - 106 mmol/L 105 101 106  CO2 20 - 29 mmol/L 22 20 21   Calcium 8.7 - 10.2 mg/dL  9.4 9.5 9.5  Total Protein 6.0 - 8.5 g/dL - - 7.5  Total Bilirubin 0.0 - 1.2 mg/dL - - 0.7  Alkaline Phos 39 - 117 IU/L - - 99  AST 0 - 40 IU/L - - 25  ALT 0 - 44 IU/L - - 44    Lipid Panel     Component Value Date/Time   CHOL 275 (H) 08/19/2018 0949   TRIG 318 (H) 08/19/2018 0949   HDL 38 (L) 08/19/2018 0949   CHOLHDL 7.2 (H) 08/19/2018 0949   LDLCALC 173 (H) 08/19/2018 0949    CBC    Component Value Date/Time   WBC 4.5 06/06/2017 1157   WBC 6.6 05/30/2016 1531   RBC 5.33 06/06/2017 1157   RBC 5.21 05/30/2016 1531   HGB 16.3 06/06/2017 1157   HCT 48.5 06/06/2017 1157   PLT 303 06/06/2017 1157   MCV 91 06/06/2017 1157   MCH 30.6 06/06/2017 1157   MCH 31.7 05/30/2016 1531   MCHC 33.6 06/06/2017 1157   MCHC 34.4 05/30/2016 1531   RDW 13.6 06/06/2017 1157   LYMPHSABS 2.1 06/06/2017 1157   MONOABS 0.4 04/18/2009 1420   EOSABS 0.1 06/06/2017 1157   BASOSABS 0.0 06/06/2017 1157    Lab Results  Component Value Date   HGBA1C 5.4 09/25/2016    Assessment & Plan:   1. Bilateral carpal tunnel syndrome Advised to use wrist splints - naproxen (NAPROSYN) 500 MG tablet; Take 1 tablet (500 mg total) by mouth 2 (two) times daily with a meal.  Dispense: 60 tablet; Refill: 1 - gabapentin (NEURONTIN) 300 MG capsule; Take 1 capsule (300 mg total) by mouth at bedtime.  Dispense: 30 capsule; Refill: 1 - cyclobenzaprine (FLEXERIL) 10 MG tablet; Take 1 tablet (10 mg total) by mouth at bedtime.  Dispense: 30 tablet; Refill: 1  2. Rash Uncontrolled - Ambulatory referral to Dermatology - clotrimazole-betamethasone (LOTRISONE) cream; Apply 1 application topically 2 (two) times daily. X 1 month  Dispense: 30 g; Refill: 1   Health Care Maintenance: Flu shot today Meds ordered this encounter  Medications  . naproxen (NAPROSYN) 500 MG tablet    Sig: Take 1 tablet (500 mg total) by mouth 2 (two) times daily with a meal.    Dispense:  60 tablet    Refill:  1  . gabapentin (NEURONTIN)  300 MG capsule    Sig: Take 1 capsule (300 mg total) by mouth at bedtime.    Dispense:  30 capsule    Refill:  1  . cyclobenzaprine (FLEXERIL) 10 MG tablet    Sig: Take 1 tablet (10 mg total) by mouth at bedtime.    Dispense:  30 tablet    Refill:  1  . clotrimazole-betamethasone (LOTRISONE) cream    Sig: Apply 1 application topically 2 (two) times daily. X 1 month    Dispense:  30 g    Refill:  1    Follow-up: Return in about 6 months (around 08/17/2019) for Coordination of care.       Hoy Register, MD, FAAFP. [facility] [place], [place] [phone]   02/17/2019, 9:53 AM

## 2019-02-17 NOTE — Patient Instructions (Addendum)
Sndrome del tnel carpiano Carpal Tunnel Syndrome  El sndrome del tnel carpiano es una afeccin que causa dolor en la mano y en el brazo. El tnel carpiano es un rea estrecha que se encuentra en el lado palmar de la mueca. Los movimientos repetidos de la mueca o determinadas enfermedades pueden causar hinchazn en el tnel. Esta hinchazn puede comprimir el nervio principal de la mueca (nervio mediano). Cules son las causas? Esta afeccin puede ser causada por lo siguiente:  Movimientos repetidos de la mueca.  Lesiones en la mueca.  Artritis.  Un saco lleno de lquido (quiste) o un crecimiento anormal (tumor) en el tnel carpiano.  Acumulacin de lquido durante el embarazo. Algunas veces, la causa no se conoce. Qu incrementa el riesgo? Los siguientes factores pueden hacer que sea ms propenso a contraer esta afeccin:  Tener un trabajo que exige mover la mueca del mismo modo muchas veces. Esto incluye trabajos como el de carnicero o el de cajero.  Ser mujer.  Tener otras afecciones, por ejemplo: ? Diabetes. ? Obesidad. ? Una glndula tiroidea que no es lo suficientemente activa (hipotiroidismo). ? Insuficiencia renal. Cules son los signos o sntomas? Los sntomas de esta afeccin incluyen:  Sensacin de hormigueo en los dedos.  Hormigueo o prdida de la sensibilidad (adormecimiento) en la mano.  Dolor en todo el brazo. Este dolor puede empeorar al flexionar la mueca y el codo durante mucho tiempo.  Dolor en la mueca que sube por el brazo hasta el hombro.  Dolor que baja hasta la palma de la mano o los dedos.  Sensacin de debilidad en las manos. Puede resultarle difcil tomar y sostener objetos. Es posible que se sienta peor por la noche. Cmo se diagnostica? Esta afeccin se diagnostica mediante los antecedentes mdicos y un examen fsico. Tambin pueden hacerle estudios, por ejemplo:  Una electromiograma (EMG). Este estudio comprueba las seales  que los nervios envan a los msculos.  Estudio de conduccin nerviosa. Este estudio comprueba si las seales pasan correctamente por los nervios.  Estudios de diagnstico por imgenes, como radiografas, una ecografa y una resonancia magntica (RM). Estos estudios permiten detectar cul podra ser la causa de su afeccin. Cmo se trata? El tratamiento de esta afeccin puede incluir:  Cambios en el estilo de vida. Se le pedir que deje o cambie la actividad que caus el problema.  Hacer ejercicio y actividades para que los msculos y los huesos se vuelvan ms fuertes (fisioterapia).  Aprender a usar la mano nuevamente (terapia ocupacional).  Medicamentos para tratar el dolor y la hinchazn (inflamacin). Es posible que le apliquen inyecciones en la mueca.  Una frula para la mueca.  Una ciruga. Siga estas indicaciones en su casa: Si tiene una frula:  Use la frula como se lo haya indicado el mdico. Quteselo solamente como se lo haya indicado el mdico.  Afloje la frula si los dedos: ? Hormiguean. ? Pierden la sensibilidad (se adormecen). ? Se tornan fros y de color azul.  Mantenga la frula limpia.  Si la frula no es impermeable: ? No deje que se moje. ? Cbralo con un envoltorio hermtico cuando tome un bao de inmersin o una ducha. Control del dolor, la rigidez y la hinchazn   Si se lo indican, aplique hielo sobre la zona dolorida: ? Si tiene una frula desmontable, qutesela como se lo haya indicado el mdico. ? Ponga el hielo en una bolsa plstica. ? Coloque una toalla entre la piel y la bolsa. ? Coloque el hielo   durante 20minutos, 2a3veces al da. Indicaciones generales  Tome los medicamentos de venta libre y los recetados solamente como se lo haya indicado el mdico.  Descanse la mueca de cualquier actividad que le cause dolor. Si es necesario, hable con su jefe en el trabajo sobre los cambios que pueden ayudar a la curacin de la mueca.  Haga  los ejercicios como se lo hayan indicado el mdico, el fisioterapeuta o el terapeuta ocupacional.  Concurra a todas las visitas de seguimiento como se lo haya indicado el mdico. Esto es importante. Comunquese con un mdico si:  Aparecen nuevos sntomas.  Los medicamentos no le alivian el dolor.  Sus sntomas empeoran. Solicite ayuda de inmediato si:  Tiene adormecimiento u hormigueo muy intensos en la mueca o la mano. Resumen  El sndrome del tnel carpiano es una afeccin que causa dolor en la mano y en el brazo.  Suele deberse a movimientos repetidos de la mueca.  Este problema se trata mediante cambios en el estilo de vida y medicamentos. La ciruga puede ser necesaria en casos muy graves.  Siga las indicaciones del mdico sobre el uso de una frula, el reposo de la mueca, la asistencia a las consultas de control y llamar para pedir ayuda. Esta informacin no tiene como fin reemplazar el consejo del mdico. Asegrese de hacerle al mdico cualquier pregunta que tenga. Document Released: 05/16/2011 Document Revised: 10/30/2017 Document Reviewed: 10/30/2017 Elsevier Patient Education  2020 Elsevier Inc.  

## 2019-02-26 ENCOUNTER — Ambulatory Visit: Payer: Self-pay

## 2019-03-03 ENCOUNTER — Other Ambulatory Visit: Payer: Self-pay

## 2019-03-03 ENCOUNTER — Ambulatory Visit: Payer: Self-pay | Attending: Family Medicine

## 2019-03-18 ENCOUNTER — Telehealth: Payer: Self-pay | Admitting: Family Medicine

## 2019-03-18 MED FILL — GABAPENTIN 300 MG CAPSULE: 300 | 30 days supply | Qty: 30 | Fill #1

## 2019-03-18 MED FILL — CYCLOBENZAPRINE 10 MG TAB: 10 | 30 days supply | Qty: 30 | Fill #1

## 2019-03-18 NOTE — Telephone Encounter (Signed)
Patient came in wanting to get a dermatology referral. Please follow up.

## 2019-03-18 NOTE — Telephone Encounter (Signed)
Referral was sent today to [facility] with the [facility] card and they would contact patient to schedule an appointment .

## 2019-04-07 MED FILL — TERBINAFINE HCL 250 MG TAB: 250 | 14 days supply | Qty: 14 | Fill #0

## 2019-04-07 MED FILL — CICLOPIROX 0.77% CREAM: 0.77 | 15 days supply | Qty: 30 | Fill #0

## 2019-04-14 ENCOUNTER — Other Ambulatory Visit: Payer: Self-pay

## 2019-04-14 ENCOUNTER — Ambulatory Visit: Payer: Self-pay | Attending: Family Medicine | Admitting: Family Medicine

## 2019-04-14 ENCOUNTER — Encounter: Payer: Self-pay | Admitting: Family Medicine

## 2019-04-14 VITALS — BP 116/70 | HR 66 | Temp 98.0°F | Ht 64.0 in | Wt 166.2 lb

## 2019-04-14 DIAGNOSIS — H6122 Impacted cerumen, left ear: Secondary | ICD-10-CM

## 2019-04-14 DIAGNOSIS — E7800 Pure hypercholesterolemia, unspecified: Secondary | ICD-10-CM

## 2019-04-14 MED ORDER — ATORVASTATIN CALCIUM 40 MG PO TABS: 40.0000 mg | ORAL_TABLET | Freq: Every day | ORAL | 6 refills | Status: DC

## 2019-04-14 MED FILL — ?ATORVASTATIN 40MG TABLET: 40 | 30 days supply | Qty: 30 | Fill #0

## 2019-04-14 NOTE — Progress Notes (Signed)
Established Patient Office Visit  Subjective:  Patient ID: Andrew Sandoval, male    DOB: [birth year]  Age: 38 y.o. MRN: [id number]  CC:  Chief Complaint  Patient presents with  . Hyperlipidemia    HPI Andrew Sandoval is a 38 year old patient with a history of hyperlipidemia who presents today to have his cholesterol levels checked.  He has been out of his atorvastatin for the past two weeks because he didn't realize that he had refills available at the pharmacy.  Prior to this he was compliant with his medication and tolerated well.  The patient is complaining of diminished hearing in the left ear for the past month.  Denies pain.  Hearing in right ear is unchanged.  He has not taken anything to help his symptoms but he does clean his ear canals with cotton swabs.  Denies recent URI.  Past Medical History:  Diagnosis Date  . Cocaine use 2006-2010    Past Surgical History:  Procedure Laterality Date  . No prior surgery      Family History  Problem Relation Age of Onset  . Diabetes Neg Hx     Social History   Socioeconomic History  . Marital status: Single    Spouse name: Not on file  . Number of children: Not on file  . Years of education: Not on file  . Highest education level: Not on file  Occupational History    Comment: [employer]  Social Needs  . Financial resource strain: Not on file  . Food insecurity    Worry: Not on file    Inability: Not on file  . Transportation needs    Medical: Not on file    Non-medical: Not on file  Tobacco Use  . Smoking status: Former Research scientist (life sciences)  . Smokeless tobacco: Never Used  . Tobacco comment: quit 12 years ago  Substance and Sexual Activity  . Alcohol use: No    Alcohol/week: 0.0 standard drinks    Comment: quit 12 years ago  . Drug use: Yes    Types: Cocaine, Marijuana    Comment: Prior cocaine; none in 11 years  . Sexual activity: Not on file  Lifestyle  . Physical activity    Days per week: Not on file   Minutes per session: Not on file  . Stress: Not on file  Relationships  . Social Herbalist on phone: Not on file    Gets together: Not on file    Attends religious service: Not on file    Active member of club or organization: Not on file    Attends meetings of clubs or organizations: Not on file    Relationship status: Not on file  . Intimate partner violence    Fear of current or ex partner: Not on file    Emotionally abused: Not on file    Physically abused: Not on file    Forced sexual activity: Not on file  Other Topics Concern  . Not on file  Social History Narrative  . Not on file    Outpatient Medications Prior to Visit  Medication Sig Dispense Refill  . clotrimazole-betamethasone (LOTRISONE) cream Apply 1 application topically 2 (two) times daily. X 1 month 30 g 1  . cyclobenzaprine (FLEXERIL) 10 MG tablet Take 1 tablet (10 mg total) by mouth at bedtime. 30 tablet 1  . gabapentin (NEURONTIN) 300 MG capsule Take 1 capsule (300 mg total) by mouth at bedtime. 30 capsule 1  .  naproxen (NAPROSYN) 500 MG tablet Take 1 tablet (500 mg total) by mouth 2 (two) times daily with a meal. 60 tablet 1  . atorvastatin (LIPITOR) 40 MG tablet Take 1 tablet (40 mg total) by mouth daily. 30 tablet 6  . fluticasone (FLONASE) 50 MCG/ACT nasal spray Place 2 sprays into both nostrils daily. (Patient not taking: Reported on 12/30/2018) 16 g 1   No facility-administered medications prior to visit.     Allergies  Allergen Reactions  . Tylenol [Acetaminophen] Hives    ROS Review of Systems  Constitutional: Negative for fatigue, fever and unexpected weight change.  HENT: Positive for hearing loss. Negative for congestion, ear pain, postnasal drip, rhinorrhea, sinus pressure and sinus pain.        Left ear  Eyes: Negative for visual disturbance.  Respiratory: Negative for cough, chest tightness and shortness of breath.   Cardiovascular: Negative for chest pain, palpitations and leg  swelling.  Gastrointestinal: Negative for abdominal distention, abdominal pain, constipation, diarrhea, nausea and vomiting.  Endocrine: Negative for polydipsia and polyuria.  Genitourinary: Negative for decreased urine volume, difficulty urinating and dysuria.  Musculoskeletal: Negative for arthralgias and myalgias.  Skin: Negative for color change and rash.  Neurological: Positive for numbness. Negative for dizziness, tremors and weakness.       Occasional numbness in bilateral hands.  Hematological: Does not bruise/bleed easily.  Psychiatric/Behavioral: Negative for agitation and behavioral problems.      Objective:    Physical Exam  Constitutional: He is oriented to person, place, and time. He appears well-developed and well-nourished. No distress.  HENT:  Head: Normocephalic and atraumatic.  Right Ear: Hearing, tympanic membrane and external ear normal.  Left Ear: External ear normal. Decreased hearing is noted.  Nose: Nose normal.  Mouth/Throat: Oropharynx is clear and moist.  Cerumen accumulation in left ear canal.  Unable to visualize tympanic membrane.  Eyes: Pupils are equal, round, and reactive to light. Conjunctivae and EOM are normal.  Neck: Normal range of motion. Neck supple.  Cardiovascular: Normal rate, regular rhythm, normal heart sounds and intact distal pulses.  No murmur heard. Pulmonary/Chest: Effort normal and breath sounds normal. No respiratory distress.  Abdominal: Soft. Bowel sounds are normal. He exhibits no distension. There is no abdominal tenderness.  Musculoskeletal: Normal range of motion.        General: No edema.  Neurological: He is alert and oriented to person, place, and time.  Skin: Skin is warm and dry.  Psychiatric: He has a normal mood and affect. His behavior is normal.    BP 116/70   Pulse 66   Temp 98 F (36.7 C) (Oral)   Ht '5\' 4"'  (1.626 m)   Wt 166 lb 3.2 oz (75.4 kg)   SpO2 97%   BMI 28.53 kg/m  Wt Readings from Last 3  Encounters:  04/14/19 166 lb 3.2 oz (75.4 kg)  02/17/19 167 lb (75.8 kg)  08/19/18 162 lb 9.6 oz (73.8 kg)     There are no preventive care reminders to display for this patient.  There are no preventive care reminders to display for this patient.  Lab Results  Component Value Date   TSH 2.140 08/19/2018   Lab Results  Component Value Date   WBC 4.5 06/06/2017   HGB 16.3 06/06/2017   HCT 48.5 06/06/2017   MCV 91 06/06/2017   PLT 303 06/06/2017   Lab Results  Component Value Date   NA 140 08/19/2018   K 4.2 08/19/2018  CO2 22 08/19/2018   GLUCOSE 95 08/19/2018   BUN 14 08/19/2018   CREATININE 0.97 08/19/2018   BILITOT 0.7 06/06/2017   ALKPHOS 99 06/06/2017   AST 25 06/06/2017   ALT 44 06/06/2017   PROT 7.5 06/06/2017   ALBUMIN 4.6 06/06/2017   CALCIUM 9.4 08/19/2018   Lab Results  Component Value Date   CHOL 275 (H) 08/19/2018   Lab Results  Component Value Date   HDL 38 (L) 08/19/2018   Lab Results  Component Value Date   LDLCALC 173 (H) 08/19/2018   Lab Results  Component Value Date   TRIG 318 (H) 08/19/2018   Lab Results  Component Value Date   CHOLHDL 7.2 (H) 08/19/2018   Lab Results  Component Value Date   HGBA1C 5.4 09/25/2016      Assessment & Plan:   1. Pure hypercholesterolemia Uncontrolled  Educated on the importance of medication compliance. Resume atorvastatin. - CMP14+EGFR - Lipid panel - atorvastatin (LIPITOR) 40 MG tablet; Take 1 tablet (40 mg total) by mouth daily.  Dispense: 30 tablet; Refill: 6  2. Impacted cerumen of left ear Irrigation of the left ear performed today. Educated on the risk of cerumen impaction with the use of cotton swabs in the ear canal.   Meds ordered this encounter  Medications  . atorvastatin (LIPITOR) 40 MG tablet    Sig: Take 1 tablet (40 mg total) by mouth daily.    Dispense:  30 tablet    Refill:  6    Discontinue previous dose    Follow-up: Return for Chronic medical conditions, keep  previously scheduled appointment.    Tomasita Morrow, RN

## 2019-04-14 NOTE — Patient Instructions (Signed)
Acumulacin de cera en el odo en adultos Earwax Buildup, Adult Los odos producen una sustancia llamada cera que ayuda a evitar que ingresen bacterias en el odo y protege la piel del canal [place]. En ocasiones, la cera se puede acumular en el odo y causar molestias o prdida de la audicin. Qu incrementa el riesgo? Es ms probable que Orthoptist en las personas que:  Son hombres.  Son de [place].  Producen ms cera de forma natural.  Se limpian frecuentemente los odos con hisopos.  Usan tapones para los odos con frecuencia.  Usan auriculares internos con frecuencia.  Usan audfonos.  Tienen los canales [employer].  Tienen cera que es demasiado densa o pegajosa.  Tienen eczema.  Estn deshidratadas.  Tienen vello excesivo en el canal auditivo. Cules son los signos o los sntomas? Los sntomas de esta afeccin [employer] siguientes:  Audicin reducida [place].  Sensacin de que el odo est lleno u obstruido.  Secrecin de lquido.  Dolor de odo.  Picazn en el odo.  Zumbidos en el odo.  Tos.  Porcin de cera que se puede observar en el interior del canal auditivo. Cmo se diagnostica? Esta afeccin se puede diagnosticar en funcin de lo siguiente:  Sus sntomas.  Sus antecedentes mdicos.  Un examen de odo. Durante el examen, el mdico mirar dentro de su odo con un instrumento llamado otoscopio. Pueden hacerle otros estudios, como una prueba de audicin. Cmo se trata? El tratamiento de esta afeccin puede incluir lo siguiente:  Gotas ticas para ablandar la cera.  Extraccin de cera realizada por un mdico. El mdico tambin podr hacer lo siguiente: ? Enjuagar el odo con agua. ? Usar un instrumento con punta en asa (cureta). ? Usar un dispositivo para aspirar.  Ciruga para extraer la acumulacin de cera. Esto puede Editor, commissioning graves. Siga estas indicaciones en su casa:   Tome los  medicamentos de venta libre y los recetados solamente como se lo haya indicado el mdico.  No se introduzca ningn objeto en el odo, ni siquiera hisopos de algodn. La abertura del canal auditivo se puede limpiar con un pao o pauelo facial.  Siga las indicaciones del mdico acerca de cmo limpiarse los odos. No se limpie los odos en exceso.  Beba suficiente lquido como para mantener la orina clara o de color amarillo plido. Esto ayudar a Cabin crew.  Concurra a todas las visitas de control como se lo haya indicado el mdico. Si tiene acumulacin de cera en el odo con frecuencia o [place] audfonos, visite a su mdico para que le realice una limpieza de odo de rutina y preventiva. Pregntele al mdico con qu frecuencia debe programar estas limpiezas.  Si tiene audfonos, lmpielos segn las instrucciones del fabricante y de su mdico. Comunquese con un mdico si:  Tiene dolor de odo.  Presenta fiebre.  [employer] features editor, pus u otro lquido del odo.  Tiene prdida de la audicin.  Tiene zumbidos en el odo que no desaparecen.  Los sntomas no mejoran con Dispensing optician.  Tiene la sensacin de que la habitacin da vueltas (vrtigo). Resumen  La cera se puede acumular en el odo y causar molestias o prdida de la audicin.  Los sntomas ms comunes de esta afeccin incluyen una audicin reducida o [place] y la sensacin de que el odo est lleno u obstruido.  Esta afeccin se puede diagnosticar en funcin de los sntomas, sus antecedentes mdicos y un examen de  odo.  Esta afeccin se puede tratar mediante gotas ticas que ablandan la cera o mediante la extraccin de la cera que realiza el mdico.  No se introduzca ningn objeto en el odo, ni siquiera hisopos de algodn. La abertura del canal auditivo se puede limpiar con un pao o pauelo facial. Esta informacin no tiene como fin reemplazar el consejo del mdico. Asegrese de hacerle al mdico cualquier pregunta que  tenga. Document Released: 05/27/2005 Document Revised: 09/05/2017 Document Reviewed: 01/20/2017 Elsevier Patient Education  2020 [employer].

## 2019-04-14 NOTE — Progress Notes (Signed)
No Lipitor in 2 weeks.

## 2019-04-15 LAB — CMP14+EGFR
ALT: 54 IU/L — ABNORMAL HIGH (ref 0–44)
AST: 26 IU/L (ref 0–40)
Albumin/Globulin Ratio: 1.6 (ref 1.2–2.2)
Albumin: 4.5 g/dL (ref 4.0–5.0)
Alkaline Phosphatase: 111 IU/L (ref 39–117)
BUN/Creatinine Ratio: 15 (ref 9–20)
BUN: 14 mg/dL (ref 6–20)
Bilirubin Total: 0.6 mg/dL (ref 0.0–1.2)
CO2: 22 mmol/L (ref 20–29)
Calcium: 9.4 mg/dL (ref 8.7–10.2)
Chloride: 104 mmol/L (ref 96–106)
Creatinine, Ser: 0.94 mg/dL (ref 0.76–1.27)
GFR calc Af Amer: 118 mL/min/{1.73_m2} (ref >59)
GFR calc non Af Amer: 102 mL/min/{1.73_m2} (ref >59)
Globulin, Total: 2.9 g/dL (ref 1.5–4.5)
Glucose: 95 mg/dL (ref 65–99)
Potassium: 4.5 mmol/L (ref 3.5–5.2)
Sodium: 140 mmol/L (ref 134–144)
Total Protein: 7.4 g/dL (ref 6.0–8.5)

## 2019-04-15 LAB — LIPID PANEL
Chol/HDL Ratio: 6.8 ratio — ABNORMAL HIGH (ref 0.0–5.0)
Cholesterol, Total: 243 mg/dL — ABNORMAL HIGH (ref 100–199)
HDL: 36 mg/dL — ABNORMAL LOW (ref >39)
LDL Chol Calc (NIH): 152 mg/dL — ABNORMAL HIGH (ref 0–99)
Triglycerides: 294 mg/dL — ABNORMAL HIGH (ref 0–149)
VLDL Cholesterol Cal: 55 mg/dL — ABNORMAL HIGH (ref 5–40)

## 2019-04-16 ENCOUNTER — Telehealth: Payer: Self-pay

## 2019-04-16 NOTE — Telephone Encounter (Signed)
-----   Message from Charlott Rakes, MD sent at 04/15/2019  7:57 AM EST ----- Cholesterol is elevated likely due to his being out of Lipitor for 2 weeks.  Encouraged to please comply with a diabetic diet and his medications and will repeat lipid panel at next visit

## 2019-04-16 NOTE — Telephone Encounter (Signed)
Patient name and DOB has been verified Patient was informed of lab results. Patient had no questions.  

## 2019-05-12 ENCOUNTER — Other Ambulatory Visit: Payer: Self-pay

## 2019-05-12 ENCOUNTER — Ambulatory Visit: Payer: Self-pay | Attending: Family Medicine | Admitting: Physician Assistant

## 2019-05-12 DIAGNOSIS — R21 Rash and other nonspecific skin eruption: Secondary | ICD-10-CM

## 2019-05-12 DIAGNOSIS — Z789 Other specified health status: Secondary | ICD-10-CM

## 2019-05-12 MED FILL — ?ATORVASTATIN 40MG TABLET: 40 | 30 days supply | Qty: 30 | Fill #1

## 2019-05-12 NOTE — Progress Notes (Signed)
Pt. Stated he has a fungus on the right feet that is red and swollen.  He stated he saw the dermatologist last month.

## 2019-05-12 NOTE — Progress Notes (Signed)
Virtual Visit via Telephone Note  I connected with Andrew Sandoval on 05/12/19 at  4:10 PM EST by telephone and verified that I am speaking with the correct person using two identifiers.   I discussed the limitations, risks, security and privacy concerns of performing an evaluation and management service by telephone and the availability of in person appointments. I also discussed with the patient that there may be a patient responsible charge related to this service. The patient expressed understanding and agreed to proceed.  Patient location:  home My Location:  [facility] office Persons on the call: me, the patient, and the interpreter   History of Present Illness: seen 02/17/2019(see Dr Smitty Pluck note) for a rash on the foot that had been present for about 4 years.  She prescribed him lotrisone. He saw dermatology about 1 month ago and was given medication.  The rash still has not gone away.      Observations/Objective:  NAD.  A&Ox3.     Assessment and Plan: 1. Rash Back to dermatology bc rash did not improved - Ambulatory referral to Dermatology  2. Language barrier pacific interpreters used and additional time performing visit was required.     Follow Up Instructions: See PCP for f/up as planned   I discussed the assessment and treatment plan with the patient. The patient was provided an opportunity to ask questions and all were answered. The patient agreed with the plan and demonstrated an understanding of the instructions.   The patient was advised to call back or seek an in-person evaluation if the symptoms worsen or if the condition fails to improve as anticipated.  I provided 6 minutes of non-face-to-face time during this encounter.   Andrew Caldron, PA-C  Patient ID: Andrew Sandoval, male   DOB: [birth year], 39 y.o.   MRN: [id number]

## 2019-06-08 MED FILL — ?ATORVASTATIN 40MG TABLET: 40 | 30 days supply | Qty: 30 | Fill #2

## 2019-06-23 ENCOUNTER — Encounter: Payer: Self-pay | Admitting: Family Medicine

## 2019-06-23 ENCOUNTER — Other Ambulatory Visit: Payer: Self-pay

## 2019-06-23 ENCOUNTER — Ambulatory Visit: Payer: Self-pay | Attending: Family Medicine | Admitting: Family Medicine

## 2019-06-23 VITALS — BP 124/70 | HR 62 | Ht 64.0 in | Wt 160.0 lb

## 2019-06-23 DIAGNOSIS — L309 Dermatitis, unspecified: Secondary | ICD-10-CM

## 2019-06-23 DIAGNOSIS — H9313 Tinnitus, bilateral: Secondary | ICD-10-CM

## 2019-06-23 MED ORDER — CLOTRIMAZOLE-BETAMETHASONE 1-0.05 % EX CREA: 1.0000 "application " | TOPICAL_CREAM | Freq: Two times a day (BID) | CUTANEOUS | 2 refills | Status: DC

## 2019-06-23 MED FILL — CLOTRIMAZOLE-BETAMETHASONE: 1-0.05 | 15 days supply | Qty: 30 | Fill #0

## 2019-06-23 NOTE — Progress Notes (Signed)
Subjective:  Patient ID: Andrew Sandoval, male    DOB: 10-29-1980  Age: 39 y.o. MRN: [id number]  CC: Ear Pain   HPI Andrew Sandoval is a 39 year old patient with a history of hyperlipidemia who presents today for an acute visit. He complains of buzzing in his ears L>R and would like his R foot checked as he has a "fungal infection". Buzzing has been present for 2 years. In the past he has had ear infections and yellow discharge from his ears but has nothing now.  Denies decreased hearing and was seen by ENT for this but he is unable to recall the details of that visit. He was referred to a Dermatologist for his foot rash and states he received " a pill and a cream" which was beneficial.  Past Medical History:  Diagnosis Date  . Cocaine use 2006-2010    Past Surgical History:  Procedure Laterality Date  . No prior surgery      Family History  Problem Relation Age of Onset  . Diabetes Neg Hx     Allergies  Allergen Reactions  . Tylenol [Acetaminophen] Hives    Outpatient Medications Prior to Visit  Medication Sig Dispense Refill  . atorvastatin (LIPITOR) 40 MG tablet Take 1 tablet (40 mg total) by mouth daily. 30 tablet 6  . clotrimazole-betamethasone (LOTRISONE) cream Apply 1 application topically 2 (two) times daily. X 1 month (Patient not taking: Reported on 05/12/2019) 30 g 1  . cyclobenzaprine (FLEXERIL) 10 MG tablet Take 1 tablet (10 mg total) by mouth at bedtime. (Patient not taking: Reported on 05/12/2019) 30 tablet 1  . fluticasone (FLONASE) 50 MCG/ACT nasal spray Place 2 sprays into both nostrils daily. (Patient not taking: Reported on 12/30/2018) 16 g 1  . gabapentin (NEURONTIN) 300 MG capsule Take 1 capsule (300 mg total) by mouth at bedtime. (Patient not taking: Reported on 05/12/2019) 30 capsule 1  . naproxen (NAPROSYN) 500 MG tablet Take 1 tablet (500 mg total) by mouth 2 (two) times daily with a meal. (Patient not taking: Reported on 05/12/2019) 60 tablet 1    No facility-administered medications prior to visit.     ROS Review of Systems  Constitutional: Negative for activity change and appetite change.  HENT: Positive for tinnitus. Negative for sinus pressure and sore throat.   Eyes: Negative for visual disturbance.  Respiratory: Negative for cough, chest tightness and shortness of breath.   Cardiovascular: Negative for chest pain and leg swelling.  Gastrointestinal: Negative for abdominal distention, abdominal pain, constipation and diarrhea.  Endocrine: Negative.   Genitourinary: Negative for dysuria.  Musculoskeletal: Negative for joint swelling and myalgias.  Skin: Positive for rash.  Allergic/Immunologic: Negative.   Neurological: Negative for weakness, light-headedness and numbness.  Psychiatric/Behavioral: Negative for dysphoric mood and suicidal ideas.    Objective:  BP 124/70   Pulse 62   Ht 5\' 4"  (1.626 m)   Wt 160 lb (72.6 kg)   SpO2 98%   BMI 27.46 kg/m   BP/Weight 06/23/2019 04/14/2019 02/17/2019  Systolic BP 124 116 117  Diastolic BP 70 70 71  Wt. (Lbs) 160 166.2 167  BMI 27.46 28.53 29.58      Physical Exam Constitutional:      Appearance: He is well-developed.  HENT:     Right Ear: Tympanic membrane normal.     Left Ear: Tympanic membrane normal.  Neck:     Vascular: No JVD.  Cardiovascular:     Rate and Rhythm: Normal rate.  Heart sounds: Normal heart sounds. No murmur.  Pulmonary:     Effort: Pulmonary effort is normal.     Breath sounds: Normal breath sounds. No wheezing or rales.  Chest:     Chest wall: No tenderness.  Abdominal:     General: Bowel sounds are normal. There is no distension.     Palpations: Abdomen is soft. There is no mass.     Tenderness: There is no abdominal tenderness.  Musculoskeletal:        General: Normal range of motion.     Right lower leg: No edema.     Left lower leg: No edema.  Skin:    Comments: Dorsum of right foot with normal pigmented plaque, rough  surface, no excoriations  Neurological:     Mental Status: He is alert and oriented to person, place, and time.  Psychiatric:        Mood and Affect: Mood normal.     CMP Latest Ref Rng & Units 04/14/2019 08/19/2018 10/09/2017  Glucose 65 - 99 mg/dL 95 95 89  BUN 6 - 20 mg/dL 14 14 16   Creatinine 0.76 - 1.27 mg/dL 0.94 0.97 1.05  Sodium 134 - 144 mmol/L 140 140 140  Potassium 3.5 - 5.2 mmol/L 4.5 4.2 4.1  Chloride 96 - 106 mmol/L 104 105 101  CO2 20 - 29 mmol/L 22 22 20   Calcium 8.7 - 10.2 mg/dL 9.4 9.4 9.5  Total Protein 6.0 - 8.5 g/dL 7.4 - -  Total Bilirubin 0.0 - 1.2 mg/dL 0.6 - -  Alkaline Phos 39 - 117 IU/L 111 - -  AST 0 - 40 IU/L 26 - -  ALT 0 - 44 IU/L 54(H) - -    Lipid Panel     Component Value Date/Time   CHOL 243 (H) 04/14/2019 0954   TRIG 294 (H) 04/14/2019 0954   HDL 36 (L) 04/14/2019 0954   CHOLHDL 6.8 (H) 04/14/2019 0954   LDLCALC 152 (H) 04/14/2019 0954    CBC    Component Value Date/Time   WBC 4.5 06/06/2017 1157   WBC 6.6 05/30/2016 1531   RBC 5.33 06/06/2017 1157   RBC 5.21 05/30/2016 1531   HGB 16.3 06/06/2017 1157   HCT 48.5 06/06/2017 1157   PLT 303 06/06/2017 1157   MCV 91 06/06/2017 1157   MCH 30.6 06/06/2017 1157   MCH 31.7 05/30/2016 1531   MCHC 33.6 06/06/2017 1157   MCHC 34.4 05/30/2016 1531   RDW 13.6 06/06/2017 1157   LYMPHSABS 2.1 06/06/2017 1157   MONOABS 0.4 04/18/2009 1420   EOSABS 0.1 06/06/2017 1157   BASOSABS 0.0 06/06/2017 1157    Lab Results  Component Value Date   HGBA1C 5.4 09/25/2016    Assessment & Plan:   1. Tinnitus of both ears Examination of ears unrevealing Patient reassured and advised to use earplugs-condition is self-limiting  2. Dermatitis Improving We will refill Lotrisone - clotrimazole-betamethasone (LOTRISONE) cream; Apply 1 application topically 2 (two) times daily. X 1 month  Dispense: 30 g; Refill: 2    Return in about 3 months (around 09/21/2019) for Follow-up of  hypercholesterolemia.   Charlott Rakes, MD, FAAFP. [facility] and [facility], [place]   06/23/2019, 9:18 AM

## 2019-06-23 NOTE — Progress Notes (Signed)
Patient states that he hears a buzzing noise in his ear at night.

## 2019-06-23 NOTE — Patient Instructions (Signed)
Tinnitus Tinnitus El trmino tinnitus hace referencia a la percepcin de un sonido que no se corresponde con ninguna fuente real para ese sonido. A menudo se lo describe como zumbido de odos. Sin embargo, las personas que sufren esta afeccin pueden percibir diferentes ruidos, en uno o ambos odos. Los sonidos del tinnitus pueden ser [place], fuertes o de intensidad intermedia. El tinnitus puede prolongarse pocos segundos o ser constante durante [address]. Puede desaparecer sin tratamiento y regresar en distintos momentos. Cuando el tinnitus es permanente u ocurre con frecuencia, puede causar otros problemas, por ejemplo, dificultad para dormir y para concentrarse. Casi todas las Environmental health practitioner tinnitus en algn momento. El tinnitus a Air cabin crew (crnico) o que regresa con frecuencia (recurrente) es un problema que puede requerir Psychologist, prison and probation services. Cules son las causas? A menudo se desconoce la causa del tinnitus. En algunos casos, puede ser consecuencia de lo siguiente:  Exposicin a ruidos fuertes de maquinarias, [place] u otras fuentes.  Un objeto (cuerpo extrao) atascado en el odo.  Acumulacin de cerumen.  El consumo de alcohol o cafena.  Tomar ciertos medicamentos.  La prdida [place] relacionada con la edad. Tambin puede ser causada por afecciones mdicas tales como:  Infecciones de los odos o de los senos paranasales.  Presin arterial alta.  Enfermedades cardacas.  Anemia.  Alergias.  Enfermedad de [place].  Problemas de tiroides.  Tumores.  Un vaso sanguneo dbil o abultado (aneurisma) cerca del odo. Cules son los signos o sntomas? El principal sntoma de tinnitus es la percepcin de un sonido que no se corresponde con ninguna fuente, no proviene de Dealer. El sonido puede percibirse como lo siguiente:  Vibracin.  Crepitacin.  Zumbido.  Soplido de aire.  Sibilancia.  Silbido.  Chisporroteo.  Runrn.  Una corriente de  agua.  Una nota musical.  Golpecitos. Es posible que los sntomas afecten un solo odo (unilateral) o ambos odos (bilateral). Cmo se diagnostica? El tinnitus se diagnostica en funcin de los sntomas, antecedentes mdicos y un examen fsico. El mdico puede realizar una prueba auditiva exhaustiva (examen de audicin) si el tinnitus:  Es unilateral.  Causa dificultades [phone].  Dura ms de . Usted puede trabajar con un mdico especialista en trastornos [employer] (audilogo). Le pueden hacer preguntas sobre sus sntomas y cmo estos afectan su vida diaria. Es posible que le hagan algunas pruebas, por ejemplo:  Exploracin por tomografa computarizada (TC).  Resonancia magntica (RM).  Un estudio de diagnstico por imgenes de cmo fluye la sangre a travs de los vasos sanguneos (angiograma). Cmo se trata? A veces, el tratamiento de una enfermedad preexistente hace que el tinnitus desaparezca. Si el tinnitus contina, probablemente debe realizar [employer] siguientes tratamientos, [place] otros:  Medicamentos.  Terapia y orientacin para ayudarlo a controlar el estrs que significa vivir con tinnitus.  Generadores de sonido para enmascarar el tinnitus. Esto incluye lo siguiente: ? Aparatos de sonido de mesa que reproducen sonidos relajantes para ayudarlo a dormir. ? Dispositivos inteligentes que se adaptan al odo y reproducen sonidos o [place]. ? Estimulacin Patent examiner usar auriculares para escuchar msica que contiene Librarian, academic. Con el tiempo, escuchar esta seal puede modificar las redes del cerebro y reducir la sensibilidad al tinnitus. Este tratamiento se [place] en casos muy graves cuando ningn otro tratamiento resulta eficaz.  El uso de audfonos o implantes cocleares, si el tinnitus guarda relacin con la prdida de la audicin. Los audfonos se usan en el odo externo. Implantes cocleares se colocan  quirrgicamente en el odo  interno. Siga estas instrucciones en su casa: Controlar los sntomas      Cuando sea posible, no permanezca en lugares ruidosos y no se exponga a sonidos fuertes.  Use dispositivos de proteccin de la audicin, por ejemplo, tapones, cuando est expuesto a ruidos fuertes.  Use un aparato de sonido de fondo, un humidificador u otros dispositivos para enmascarar el sonido del tinnitus.  Ponga en prctica tcnicas para reducir el estrs, como meditacin, yoga o respiracin profunda. Trabaje con el mdico si necesita ayuda para Dealer.  Duerma con la cabeza levemente elevada. Esto puede reducir el impacto del tinnitus. Instrucciones generales  No use sustancias estimulantes, como nicotina, alcohol o cafena. Hable con el mdico sobre otros estimulantes que Water engineer. Los estimulantes son sustancias que pueden hacer que se sienta alerta y atento al aumentar determinadas actividades en el cuerpo (por ejemplo, la frecuencia cardaca y la presin arterial). Estas sustancias pueden empeorar el tinnitus.  Use los medicamentos de venta libre y los recetados solamente como se lo haya indicado el mdico.  Intente dormir mucho todas las noches.  Concurra a todas las visitas de [address] se lo haya indicado el mdico. Esto es importante. Comunquese con un mdico si:  El tinnitus se prolonga durante 3semanas o ms tiempo y no se detiene.  Pierde la audicin de forma repentina.  Los sntomas empeoran o no mejoran con los Medical laboratory scientific officer.  Siente que no puede controlar el estrs que le provoca vivir con tinnitus. Solicite ayuda de inmediato si:  Experimenta tinnitus despus de sufrir una lesin en la cabeza.  Tiene tinnitus junto con alguno de estos sntomas: ? Mareos. ? Prdida del equilibrio. ? Nuseas y vmitos. ? Dolor de cabeza repentino e intenso. Estos sntomas pueden representar un problema grave que constituye Radio broadcast assistant. No espere a ver si los sntomas  desaparecen. Solicite atencin mdica de inmediato. Comunquese con el servicio de emergencias de su localidad (911 en los [place]). No conduzca por sus propios medios [employer] [facility]. Resumen  El trmino tinnitus hace referencia a la percepcin de un sonido que no se corresponde con ninguna fuente real para ese sonido. A menudo se lo describe como zumbido de odos.  Es posible que los sntomas afecten un solo odo (unilateral) o ambos odos (bilateral).  Use un aparato de sonido de fondo, un humidificador u otros dispositivos para enmascarar el sonido del tinnitus.  No use sustancias estimulantes, como nicotina, alcohol o cafena. Hable con el mdico sobre otros estimulantes que Water engineer. Estas sustancias pueden empeorar el tinnitus. Esta informacin no tiene Theme park manager el consejo del mdico. Asegrese de hacerle al mdico cualquier pregunta que tenga. Document Revised: 02/10/2019 Document Reviewed: 02/10/2019 Elsevier Patient Education  2020 [employer].

## 2019-07-07 ENCOUNTER — Ambulatory Visit
Admission: EM | Admit: 2019-07-07 | Discharge: 2019-07-07 | Disposition: A | Discharge status: Home / Self Care | Payer: Self-pay | Attending: Physician Assistant | Admitting: Physician Assistant

## 2019-07-07 ENCOUNTER — Encounter: Payer: Self-pay | Admitting: Emergency Medicine

## 2019-07-07 ENCOUNTER — Other Ambulatory Visit: Payer: Self-pay

## 2019-07-07 DIAGNOSIS — R202 Paresthesia of skin: Secondary | ICD-10-CM

## 2019-07-07 DIAGNOSIS — M25511 Pain in right shoulder: Secondary | ICD-10-CM

## 2019-07-07 DIAGNOSIS — R200 Anesthesia of skin: Secondary | ICD-10-CM

## 2019-07-07 DIAGNOSIS — H9313 Tinnitus, bilateral: Secondary | ICD-10-CM

## 2019-07-07 DIAGNOSIS — M25512 Pain in left shoulder: Secondary | ICD-10-CM

## 2019-07-07 MED ORDER — AZELASTINE HCL 0.1 % NA SOLN: 2.0000 | Freq: Two times a day (BID) | NASAL | 0 refills | Status: DC

## 2019-07-07 MED ORDER — FLUTICASONE PROPIONATE 50 MCG/ACT NA SUSP: 2.0000 | Freq: Every day | NASAL | 0 refills | Status: DC

## 2019-07-07 MED ORDER — NAPROXEN 500 MG PO TABS: 500.0000 mg | ORAL_TABLET | Freq: Two times a day (BID) | ORAL | 0 refills | Status: DC

## 2019-07-07 MED FILL — ?NAPROXEN 500 MG TABS: 500 | 15 days supply | Qty: 30 | Fill #0

## 2019-07-07 MED FILL — FLUTICASONE PROP 50 MCG SPR: 50 | 30 days supply | Qty: 16 | Fill #0

## 2019-07-07 MED FILL — ?AZELASTINE HCL 137 MCG SPR: 0.1 | 25 days supply | Qty: 30 | Fill #0

## 2019-07-07 NOTE — Discharge Instructions (Signed)
Numbness tingling of both hands Start naproxen as directed. Ice compress to the wrist. Can use wrist splint at night to see if this can decrease numbness/tinglin. Follow up with PCP if symptoms not improving.  Shoulder pain/neck pain Naproxen as above Ringing of both ears Start flonase and azelastine nasal spray, 2 sprays each nostril, once daily for at least 1-2 weeks. Follow up with PCP/ENT if symptoms not improving.

## 2019-07-07 NOTE — ED Notes (Signed)
Patient able to ambulate independently  

## 2019-07-07 NOTE — ED Triage Notes (Signed)
Pt presents to [facility] for assessment of 6 months of intermittent hand numbness.  C/o right shoulder pain (sometimes on the left as well, trapezius area) x 2-3 weeks.  Denies specific known injury.   Also c/o right foot rash x 2 years, and was seen by a dermatologist and would like it looked at.

## 2019-07-07 NOTE — ED Provider Notes (Signed)
[facility]    CSN: [id number] Arrival date & time: 07/07/19  1045      History   Chief Complaint No chief complaint on file.   HPI Andrew Sandoval is a 39 y.o. male.   39 year old male comes in for multiple complaints. HPI obtained by patient through video translator.  1. 6 month history of intermittent bilateral hand numbness/tingling. This only happens at night when he sleeps and resolves in the morning. Denies injury/trauma. He usually sleeps on his back, but does feel that the numbness/tingling is worse if he sleeps on his side. He was seen by PCP for similar 12/2018, took naproxen with good relief. However, ran out of medicine. 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  2. 2-3 week history of right shoulder pain. Denies injury/trauma. States work requires heavy lifting, and feels that this may have been the initial cause of the pain. Has also had neck pain. Denies radiation down the arm. Denies worsening of the chronic numbness/tingling as above. Has not taken anything for the symptoms.  3. 1-2 month history of bilateral ear tinnitus. Has chronic nasal congestion that he uses flonase for about 1-2 times a week. Denies other URI symptoms. Denies ear pain.      Past Medical History:  Diagnosis Date  . Cocaine use 2006-2010    Patient Active Problem List   Diagnosis Date Noted  . Hyperlipidemia 06/09/2017  . Enlarged tonsils 11/11/2016  . Tinea pedis of right foot 11/11/2016  . Numbness 11/11/2016  . Positive H. pylori test 07/23/2016  . Chronic nasal congestion 01/18/2015    Past Surgical History:  Procedure Laterality Date  . No prior surgery         Home Medications    Prior to Admission medications   Medication Sig Start Date End Date Taking? Authorizing Provider  atorvastatin (LIPITOR) 40 MG tablet Take 1 tablet (40 mg total) by mouth daily. 04/14/19   Hoy Register, MD  azelastine (ASTELIN) 0.1 % nasal spray Place 2  sprays into both nostrils 2 (two) times daily. 07/07/19   Cathie Hoops, Anja Neuzil V, PA-C  clotrimazole-betamethasone (LOTRISONE) cream Apply 1 application topically 2 (two) times daily. X 1 month 06/23/19   Hoy Register, MD  fluticasone (FLONASE) 50 MCG/ACT nasal spray Place 2 sprays into both nostrils daily. 07/07/19   Cathie Hoops, Thierry Dobosz V, PA-C  naproxen (NAPROSYN) 500 MG tablet Take 1 tablet (500 mg total) by mouth 2 (two) times daily. 07/07/19   Cathie Hoops, Dantre Yearwood V, PA-C  gabapentin (NEURONTIN) 300 MG capsule Take 1 capsule (300 mg total) by mouth at bedtime. Patient not taking: Reported on 05/12/2019 02/17/19 07/07/19  Hoy Register, MD    Family History Family History  Problem Relation Age of Onset  . Diabetes Neg Hx     Social History Social History   Tobacco Use  . Smoking status: Former Games developer  . Smokeless tobacco: Never Used  . Tobacco comment: quit 12 years ago  Substance Use Topics  . Alcohol use: No    Alcohol/week: 0.0 standard drinks    Comment: quit 12 years ago  . Drug use: Yes    Types: Cocaine, Marijuana    Comment: Prior cocaine; none in 11 years     Allergies   Tylenol [acetaminophen]   Review of Systems Review of Systems   Physical Exam Triage Vital Signs ED Triage Vitals  Enc Vitals Group     BP 07/07/19 1051 (!) 147/85  Pulse Rate 07/07/19 1051 (!) 54     Resp 07/07/19 1051 16     Temp 07/07/19 1051 97.7 F (36.5 C)     Temp Source 07/07/19 1051 Oral     SpO2 07/07/19 1051 98 %     Weight --      Height --      Head Circumference --      Peak Flow --      Pain Score 07/07/19 1052 5     Pain Loc --      Pain Edu? --      Excl. in [place]? --    No data found.  Updated Vital Signs BP (!) 147/85 (BP Location: Left Arm)   Pulse (!) 54   Temp 97.7 F (36.5 C) (Oral)   Resp 16   SpO2 98%   Physical Exam Constitutional:      General: He is not in acute distress.    Appearance: He is well-developed. He is not diaphoretic.  HENT:     Head: Normocephalic and atraumatic.      Right Ear: Ear canal and external ear normal. Tympanic membrane is not erythematous or bulging.     Left Ear: Ear canal and external ear normal. Tympanic membrane is not erythematous or bulging.     Ears:     Comments: Bilateral TM opaque.  Eyes:     Conjunctiva/sclera: Conjunctivae normal.     Pupils: Pupils are equal, round, and reactive to light.  Cardiovascular:     Rate and Rhythm: Normal rate and regular rhythm.     Heart sounds: Normal heart sounds. No murmur. No friction rub. No gallop.   Pulmonary:     Effort: Pulmonary effort is normal. No accessory muscle usage or respiratory distress.     Breath sounds: Normal breath sounds. No stridor. No decreased breath sounds, wheezing, rhonchi or rales.  Musculoskeletal:     Cervical back: Normal range of motion and neck supple. No pain with movement, spinous process tenderness or muscular tenderness.     Comments: No tenderness on palpation of the spinous processes. Tenderness to palpation of bilateral middle trapezius. No bony tenderness to shoulder. Full range of motion of neck, shoulder, back. Strength normal and equal bilaterally. Normal grip strength. Sensation intact and equal bilaterally.Radial pulses 2+ and equal bilaterally. Capillary refill less than 2 seconds.   Negative Tinel's, Phalen's, Finkelstein  Skin:    General: Skin is warm and dry.  Neurological:     Mental Status: He is alert and oriented to person, place, and time.      UC Treatments / Results  Labs (all labs ordered are listed, but only abnormal results are displayed) Labs Reviewed - No data to display  EKG   Radiology No results found.  Procedures Procedures (including critical care time)  Medications Ordered in UC Medications - No data to display  Initial Impression / Assessment and Plan / UC Course  I have reviewed the triage vital signs and the nursing notes.  Pertinent labs & imaging results that were available during my care of the  patient were reviewed by me and considered in my medical decision making (see chart for details).     1.  Numbness tingling of both hands No alarming signs on exam.  Patient has had good relief with naproxen, has been evaluated by PCP multiple times.  Will refill naproxen, patient to follow-up with PCP for further evaluation and management needed.  Return precautions given.  2.  Shoulder pain/trapezius pain No alarming signs on exam.  Likely due to overuse given patient's line of work.  Will have patient take naproxen as above, and follow-up with PCP if symptoms not improving.  3.  Bilateral ear tinnitus Will have patient take Flonase more consistently, and add on azelastine as directed.  Patient has establishment with ENT, to follow-up if symptoms not improving.  Patient also complaining of rash that has been going on for 2 to 4 years.  Has been seen by PCP, dermatology.  He is in the process of being referred back to dermatology again.  This was deferred today, and will have patient follow-up with dermatology as per PCP plan.  Final Clinical Impressions(s) / UC Diagnoses   Final diagnoses:  Numbness and tingling in both hands  Acute pain of both shoulders  Tinnitus of both ears   ED Prescriptions    Medication Sig Dispense Auth. Provider   naproxen (NAPROSYN) 500 MG tablet Take 1 tablet (500 mg total) by mouth 2 (two) times daily. 30 tablet Kang Ishida V, PA-C   azelastine (ASTELIN) 0.1 % nasal spray Place 2 sprays into both nostrils 2 (two) times daily. 30 mL Keanan Melander V, PA-C   fluticasone (FLONASE) 50 MCG/ACT nasal spray Place 2 sprays into both nostrils daily. 1 g Belinda Fisher, PA-C     PDMP not reviewed this encounter.   Belinda Fisher, PA-C 07/07/19 1154

## 2019-07-08 MED FILL — ?ATORVASTATIN 40MG TABL: 40 | 30 days supply | Qty: 30 | Fill #3

## 2019-08-11 ENCOUNTER — Ambulatory Visit: Payer: Self-pay | Attending: Family Medicine | Admitting: Physician Assistant

## 2019-08-11 ENCOUNTER — Other Ambulatory Visit: Payer: Self-pay

## 2019-08-11 VITALS — BP 125/77 | HR 62 | Temp 98.0°F | Resp 16 | Ht 63.0 in | Wt 162.0 lb

## 2019-08-11 DIAGNOSIS — Z79899 Other long term (current) drug therapy: Secondary | ICD-10-CM | POA: Insufficient documentation

## 2019-08-11 DIAGNOSIS — L309 Dermatitis, unspecified: Secondary | ICD-10-CM

## 2019-08-11 DIAGNOSIS — R079 Chest pain, unspecified: Secondary | ICD-10-CM

## 2019-08-11 DIAGNOSIS — H9313 Tinnitus, bilateral: Secondary | ICD-10-CM | POA: Insufficient documentation

## 2019-08-11 DIAGNOSIS — Z886 Allergy status to analgesic agent status: Secondary | ICD-10-CM | POA: Insufficient documentation

## 2019-08-11 DIAGNOSIS — G8929 Other chronic pain: Secondary | ICD-10-CM

## 2019-08-11 DIAGNOSIS — Z789 Other specified health status: Secondary | ICD-10-CM

## 2019-08-11 DIAGNOSIS — M25511 Pain in right shoulder: Secondary | ICD-10-CM

## 2019-08-11 DIAGNOSIS — Z09 Encounter for follow-up examination after completed treatment for conditions other than malignant neoplasm: Secondary | ICD-10-CM

## 2019-08-11 DIAGNOSIS — G5603 Carpal tunnel syndrome, bilateral upper limbs: Secondary | ICD-10-CM

## 2019-08-11 MED ORDER — CLOTRIMAZOLE-BETAMETHASONE 1-0.05 % EX CREA: 1.0000 "application " | TOPICAL_CREAM | Freq: Two times a day (BID) | CUTANEOUS | 2 refills | Status: DC

## 2019-08-11 MED ORDER — NAPROXEN 500 MG PO TABS: 500.0000 mg | ORAL_TABLET | Freq: Two times a day (BID) | ORAL | 0 refills | Status: DC

## 2019-08-11 MED ORDER — METHOCARBAMOL 500 MG PO TABS: 1000.0000 mg | ORAL_TABLET | Freq: Three times a day (TID) | ORAL | 0 refills | Status: DC | PRN

## 2019-08-11 MED FILL — ATORVASTATIN CALCIUM 40 MG: 40 | 30 days supply | Qty: 30 | Fill #4

## 2019-08-11 MED FILL — ?NAPROXEN 500 MG TABS: 500 | 15 days supply | Qty: 30 | Fill #0

## 2019-08-11 MED FILL — METHOCARBAMOL 500 MG TABS: 500 | 15 days supply | Qty: 90 | Fill #0

## 2019-08-11 MED FILL — CLOTRIMAZOLE-BETAMETHASONE: 1-0.05 | 7 days supply | Qty: 30 | Fill #0

## 2019-08-11 NOTE — Progress Notes (Signed)
C /o Right shoulder pain X 3 month on and off / Pain 5/10 Does not take any meds for the pain C/o chest palpitation lasting  few seconds X 3 days and numbness of both hands during sleep

## 2019-08-11 NOTE — Patient Instructions (Signed)
Sndrome del tnel carpiano Carpal Tunnel Syndrome  El sndrome del tnel carpiano es una afeccin que causa dolor en la mano y en el brazo. El tnel carpiano es un rea estrecha ubicada en el lado palmar de la mueca. Los movimientos repetidos de la mueca o determinadas enfermedades pueden causar la hinchazn del tnel. Esta hinchazn comprime el nervio principal de la mueca (nervio mediano). Cules son las causas? Esta afeccin puede ser causada por lo siguiente:  Movimientos repetidos de la mueca.  Lesiones en la mueca.  Artritis.  Un quiste o un tumor en el tnel carpiano.  Acumulacin de lquido durante el embarazo. A veces, se desconoce la causa de esta afeccin. Qu incrementa el riesgo? Los siguientes factores pueden hacer que sea ms propenso a contraer esta afeccin:  Tener un trabajo que requiera mover repetidamente la mueca del mismo modo, por ejemplo, de carnicero o cajero.  Ser mujer.  Tener ciertas afecciones, tales como: ? Diabetes. ? Obesidad. ? Tiroidea hipoactiva (hipotiroidismo). ? Insuficiencia renal. Cules son los signos o sntomas? Los sntomas de esta afeccin incluyen:  Sensacin de hormigueo en los dedos de la mano, especialmente el pulgar, el ndice y el dedo medio.  Hormigueo o adormecimiento en la mano.  Sensacin de dolor en todo el brazo, especialmente cuando la mueca y el codo estn flexionados durante mucho tiempo.  Dolor en la mueca que sube por el brazo hasta el hombro.  Dolor que baja hasta la palma de la mano o los dedos.  Sensacin de debilidad en las manos. Tal vez tenga dificultad para tomar y sostener objetos. Los sntomas pueden empeorar durante la noche. Cmo se diagnostica? Esta afeccin se diagnostica mediante los antecedentes mdicos y un examen fsico. Tambin pueden hacerle estudios, que incluyen los siguientes:  Una electromiograma (EMG). Esta prueba mide las seales elctricas que los nervios les envan a los  msculos.  Estudio de conduccin nerviosa. Este estudio permite determinar si las seales elctricas pasan correctamente por los nervios.  Estudios de diagnstico por imgenes, como radiografas, una ecografa y una resonancia magntica (RM). Estos estudios permiten detectar las posibles causas de la afeccin. Cmo se trata? El tratamiento de esta afeccin puede incluir:  Cambios en el estilo de vida. Es importante que deje o cambie la actividad que caus la afeccin.  Hacer ejercicio y actividades para fortalecer los msculos y los huesos (fisioterapia).  Aprender a usar la mano nuevamente despus del diagnstico (terapia ocupacional).  Analgsicos y antiinflamatorios. Esto puede incluir medicamentos que se inyectan en la mueca.  Una frula para la mueca.  Una ciruga. Siga estas indicaciones en su casa: Si tiene una frula:  Use la frula como se lo haya indicado el mdico. Qutesela solamente como se lo haya indicado el mdico.  Afloje la frula si los dedos se le adormecen, siente hormigueo o se le enfran y se tornan de color azul.  Mantenga la frula limpia.  Si la frula no es impermeable: ? No deje que se moje. ? Cbrala con un envoltorio hermtico cuando tome un bao de inmersin o una ducha. Control del dolor, la rigidez y la hinchazn   Si se lo indican, aplique hielo sobre la zona del dolor: ? Si tiene una frula desmontable, qutesela como se lo haya indicado el mdico. ? Ponga el hielo en una bolsa plstica. ? Coloque una toalla entre la piel y la bolsa. ? Coloque el hielo durante 20minutos, 2a3veces al da. Indicaciones generales  Tome los medicamentos de venta libre y   los recetados solamente como se lo haya indicado el mdico.  Descanse la mueca de toda actividad que le cause dolor. Si la afeccin tiene relacin con el trabajo, hable con su empleador sobre los cambios que pueden hacerse, por ejemplo, usar una almohadilla para apoyar la mueca  mientras tipea.  Haga los ejercicios como se lo hayan indicado el mdico, el fisioterapeuta o el terapeuta ocupacional.  Concurra a todas las visitas de seguimiento como se lo haya indicado el mdico. Esto es importante. Comunquese con un mdico si:  Aparecen nuevos sntomas.  El dolor no se alivia con los medicamentos.  Sus sntomas empeoran. Solicite ayuda de inmediato si:  Tiene hormigueo o adormecimiento intensos en la mueca o la mano. Resumen  El sndrome del tnel carpiano es una afeccin que causa dolor en la mano y en el brazo.  Generalmente se debe a movimientos repetidos de la mueca.  El sndrome del tnel carpiano se trata mediante cambios en el estilo de vida y medicamentos. Tambin puede indicarse la ciruga.  Siga las indicaciones del mdico sobre el uso de una frula, el descanso de la actividad, la asistencia a las visitas de seguimiento y llamar para pedir ayuda. Esta informacin no tiene como fin reemplazar el consejo del mdico. Asegrese de hacerle al mdico cualquier pregunta que tenga. Document Revised: 10/30/2017 Document Reviewed: 10/30/2017 Elsevier Patient Education  2020 Elsevier Inc.  

## 2019-08-11 NOTE — Progress Notes (Signed)
Patient ID: Andrew Sandoval, male   DOB: [birth year], 39 y.o.   MRN: [id number]    Andrew Sandoval, is a 39 y.o. male  JIR:[id number]  [id number]:[id number]  DOB - 03/14/1981  Subjective:  Chief Complaint and HPI: Andrew Sandoval is a 39 y.o. male here today for a follow up visit  Hospital follow up-multiple issues today:   1-ongoing issues with hands falling asleep at night.  He has a little flexible glove he has been sleeping in but it does not sound as though he has tried real wrist splints.    2-CP on L side of chest for the last 3 days.  It occurs on and off.  Lasts a few mins.  Occurs with rest and activity.  No FH early cardiac events.  No SOB.  No radiating pain.  Describes as sharp pain.  No precipitating or alleviating factors.    3-R shoulder pain for about 3-4 months.  NKI.  He is R hand dominant.  He works as a Social research officer, government.  No weakness.  No neck injury.    4-rash on to foot  This has been on and off for years.  +itching.  Went away after he saw the dermatologist and took "some pills and used cream" but then came back.   [employer] interpreters translating.   After ED visit 07/07/2019 for multiple concerns:   From ED note: HPI Andrew Sandoval is a 39 y.o. male.  39 year old male comes in for multiple complaints. HPI obtained by patient through video translator.  1. 6 month history of intermittent bilateral hand numbness/tingling. This only happens at night when he sleeps and resolves in the morning. Denies injury/trauma. He usually sleeps on his back, but does feel that the numbness/tingling is worse if he sleeps on his side. He was seen by PCP for similar 12/2018, took naproxen with good relief. However, ran out of medicine. 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  2. 2-3 week history of right shoulder pain. Denies injury/trauma. States work requires heavy lifting, and feels that this may have been the initial cause of the pain. Has also had  neck pain. Denies radiation down the arm. Denies worsening of the chronic numbness/tingling as above. Has not taken anything for the symptoms.  3. 1-2 month history of bilateral ear tinnitus. Has chronic nasal congestion that he uses flonase for about 1-2 times a week. Denies other URI symptoms. Denies ear pain.   From A/P: 1.  Numbness tingling of both hands No alarming signs on exam.  Patient has had good relief with naproxen, has been evaluated by PCP multiple times.  Will refill naproxen, patient to follow-up with PCP for further evaluation and management needed.  Return precautions given.  2.  Shoulder pain/trapezius pain No alarming signs on exam.  Likely due to overuse given patient's line of work.  Will have patient take naproxen as above, and follow-up with PCP if symptoms not improving.  3.  Bilateral ear tinnitus Will have patient take Flonase more consistently, and add on azelastine as directed.  Patient has establishment with ENT, to follow-up if symptoms not improving.  Patient also complaining of rash that has been going on for 2 to 4 years.  Has been seen by PCP, dermatology.  He is in the process of being referred back to dermatology again   ED/Hospital notes reviewed.     ROS:   Constitutional:  No f/c, No night sweats, No unexplained  weight loss. EENT:  No vision changes, No blurry vision, No hearing changes. No mouth, throat, or ear problems.  Respiratory: No cough, No SOB Cardiac: + CP, no palpitations GI:  No abd pain, No N/V/D. GU: No Urinary s/sx Musculoskeletal: R shoulder pain, wrist/hand tingling B Neuro: No headache, no dizziness, no motor weakness.  Skin: No rash Endocrine:  No polydipsia. No polyuria.  Psych: Denies SI/HI  No problems updated.  ALLERGIES: Allergies  Allergen Reactions  . Tylenol [Acetaminophen] Hives    PAST MEDICAL HISTORY: Past Medical History:  Diagnosis Date  . Cocaine use 2006-2010    MEDICATIONS AT HOME: Prior to  Admission medications   Medication Sig Start Date End Date Taking? Authorizing Provider  atorvastatin (LIPITOR) 40 MG tablet Take 1 tablet (40 mg total) by mouth daily. 04/14/19  Yes Hoy Register, MD  azelastine (ASTELIN) 0.1 % nasal spray Place 2 sprays into both nostrils 2 (two) times daily. 07/07/19  Yes Yu, Amy V, PA-C  clotrimazole-betamethasone (LOTRISONE) cream Apply 1 application topically 2 (two) times daily. X 1 month 08/11/19  Yes Jedi Catalfamo M, PA-C  fluticasone (FLONASE) 50 MCG/ACT nasal spray Place 2 sprays into both nostrils daily. 07/07/19  Yes Yu, Amy V, PA-C  methocarbamol (ROBAXIN) 500 MG tablet Take 2 tablets (1,000 mg total) by mouth every 8 (eight) hours as needed for muscle spasms. 08/11/19   Anders Simmonds, PA-C  naproxen (NAPROSYN) 500 MG tablet Take 1 tablet (500 mg total) by mouth 2 (two) times daily. 08/11/19   Anders Simmonds, PA-C  gabapentin (NEURONTIN) 300 MG capsule Take 1 capsule (300 mg total) by mouth at bedtime. Patient not taking: Reported on 05/12/2019 02/17/19 07/07/19  Hoy Register, MD     Objective:  EXAM:   Vitals:   08/11/19 1443  BP: 125/77  Pulse: 62  Resp: 16  Temp: 98 F (36.7 C)  SpO2: 97%  Weight: 162 lb (73.5 kg)  Height: 5\' 3"  (1.6 m)    General appearance : A&OX3. NAD. Non-toxic-appearing HEENT: Atraumatic and Normocephalic.  PERRLA. EOM intact.  Neck: supple, no JVD. No cervical lymphadenopathy. No thyromegaly Chest/Lungs:  Breathing-non-labored, Good air entry bilaterally, breath sounds normal without rales, rhonchi, or wheezing  CVS: S1 S2 regular, no murmurs, gallops, rubs  R trapezius with spasm and TTP.  R shoulder with ~50% of normal posterior rotation.  Neg speeds/empty can.  S&ROM intact B.  B hands and wrists examined and have full S&ROM.  Both with +phalen's, neg tinels. Extremities: Bilateral Lower Ext shows no edema, both legs are warm to touch with = pulse throughout Neurology:  CN II-XII grossly intact, Non focal.    Psych:  TP linear. J/I WNL. Normal speech. Appropriate eye contact and affect.  Skin:   Rash on dorsum of R foot  Data Review Lab Results  Component Value Date   HGBA1C 5.4 09/25/2016   HGBA1C 5.50 01/18/2015     Assessment & Plan   1. Bilateral carpal tunnel syndrome Obtain correct wrist splints-showed him on google the correct type and he took pics with his phone - naproxen (NAPROSYN) 500 MG tablet; Take 1 tablet (500 mg total) by mouth 2 (two) times daily.  Dispense: 30 tablet; Refill: 0 - Ambulatory referral to Orthopedic Surgery  2. Chest pain, unspecified type EKG WNL today.  So ST segment changes.  Do not believe cardiac.  To ED if worsens or accompanied by concerning s/sx-reviewed with patient and he expresses understanding - naproxen (NAPROSYN) 500  MG tablet; Take 1 tablet (500 mg total) by mouth 2 (two) times daily.  Dispense: 30 tablet; Refill: 0 - methocarbamol (ROBAXIN) 500 MG tablet; Take 2 tablets (1,000 mg total) by mouth every 8 (eight) hours as needed for muscle spasms.  Dispense: 90 tablet; Refill: 0  3. Dermatitis - clotrimazole-betamethasone (LOTRISONE) cream; Apply 1 application topically 2 (two) times daily. X 1 month  Dispense: 30 g; Refill: 2  4. Chronic right shoulder pain - naproxen (NAPROSYN) 500 MG tablet; Take 1 tablet (500 mg total) by mouth 2 (two) times daily.  Dispense: 30 tablet; Refill: 0 - methocarbamol (ROBAXIN) 500 MG tablet; Take 2 tablets (1,000 mg total) by mouth every 8 (eight) hours as needed for muscle spasms.  Dispense: 90 tablet; Refill: 0 - Ambulatory referral to Orthopedic Surgery  5. Language barrier [employer] interpreters used and additional time performing visit was required.   6. Encounter for examination following treatment at hospital  See PCP in 3-4 months  Patient have been counseled extensively about nutrition and exercise   The patient was given clear instructions to go to ER or return to medical center if symptoms  don't improve, worsen or new problems develop. The patient verbalized understanding. The patient was told to call to get lab results if they haven't heard anything in the next week.     Georgian Co, PA-C [facility] [place], [place] [phone]   08/11/2019, 3:42 PM

## 2019-08-18 ENCOUNTER — Other Ambulatory Visit: Payer: Self-pay

## 2019-08-18 ENCOUNTER — Encounter: Payer: Self-pay | Admitting: Orthopaedic Surgery

## 2019-08-18 ENCOUNTER — Ambulatory Visit (INDEPENDENT_AMBULATORY_CARE_PROVIDER_SITE_OTHER): Payer: Self-pay | Admitting: Orthopaedic Surgery

## 2019-08-18 ENCOUNTER — Ambulatory Visit: Payer: Self-pay

## 2019-08-18 ENCOUNTER — Ambulatory Visit (INDEPENDENT_AMBULATORY_CARE_PROVIDER_SITE_OTHER): Payer: Self-pay

## 2019-08-18 VITALS — Ht 63.0 in | Wt 160.0 lb

## 2019-08-18 DIAGNOSIS — M25511 Pain in right shoulder: Secondary | ICD-10-CM

## 2019-08-18 DIAGNOSIS — M79642 Pain in left hand: Secondary | ICD-10-CM

## 2019-08-18 DIAGNOSIS — M79641 Pain in right hand: Secondary | ICD-10-CM

## 2019-08-18 NOTE — Progress Notes (Signed)
Office Visit Note   Patient: Andrew Sandoval           Date of Birth: Nov 09, 1980           MRN: [id number] Visit Date: 08/18/2019              Requested by: Andrew Register, MD [address] [place],  [place] [address] PCP: Andrew Register, MD   Assessment & Plan: Visit Diagnoses:  1. Acute pain of right shoulder   2. Pain in both hands     Plan: My impression is right AC joint arthrosis and bilateral carpal tunnel syndrome.  Patient underwent an ultrasound-guided right AC joint injection with Dr. Prince Rome today.  We will obtain nerve conduction studies to evaluate for carpal tunnel syndrome.  Patient will follow-up with [place] after the studies.  Today's encounter was performed through an interpreter which increased the complexity of the visit.  Follow-Up Instructions: Return if symptoms worsen or fail to improve.   Orders:  Orders Placed This Encounter  Procedures  . XR Shoulder Right  . US Guided Needle Placement - No Linked Charges  . Ambulatory referral to Physical Medicine Rehab   No orders of the defined types were placed in this encounter.     Procedures: No procedures performed   Clinical Data: No additional findings.   Subjective: Chief Complaint  Patient presents with  . Right Shoulder - Pain  . Right Hand - Numbness  . Left Hand - Numbness    [name] comes in today with the language interpreter.  He comes in for evaluation of suspected bilateral carpal tunnel syndrome and right shoulder pain.  In terms of the right shoulder pain this has been going on for 3 months without any injuries.  He has pain with raising his arm up and moving back.  Robaxin and naproxen have not helped.  Denies any numbness tingling or radicular symptoms.  In terms of his hands he has increased numbness worse on the right for the last 6 months without injury.  He cannot afford nighttime braces.  Denies any neck pain.   Review of Systems  Constitutional: Negative.   All other  systems reviewed and are negative.    Objective: Vital Signs: Ht 5\' 3"  (1.6 m)   Wt 160 lb (72.6 kg)   BMI 28.34 kg/m   Physical Exam Vitals and nursing note reviewed.  Constitutional:      Appearance: He is well-developed.  HENT:     Head: Normocephalic and atraumatic.  Eyes:     Pupils: Pupils are equal, round, and reactive to light.  Pulmonary:     Effort: Pulmonary effort is normal.  Abdominal:     Palpations: Abdomen is soft.  Musculoskeletal:        General: Normal range of motion.     Cervical back: Neck supple.  Skin:    General: Skin is warm.  Neurological:     Mental Status: He is alert and oriented to person, place, and time.  Psychiatric:        Behavior: Behavior normal.        Thought Content: Thought content normal.        Judgment: Judgment normal.     Ortho Exam Right shoulder exam shows tenderness of the AC joint and positive cross body adduction.  Pain with manual muscle testing but overall strength is normal.  No cellulitis.  Bilateral hand exam shows negative carpal tunnel compressive signs.  No muscle atrophy. Specialty  Comments:  No specialty comments available.  Imaging: US Guided Needle Placement - No Linked Charges  Result Date: 08/18/2019 Please see Notes tab for imaging impression.  XR Shoulder Right  Result Date: 08/18/2019 Moderate AC joint arthrosis and spurring.    PMFS History: Patient Active Problem List   Diagnosis Date Noted  . Pain in both hands 08/18/2019  . Acute pain of right shoulder 08/18/2019  . Hyperlipidemia 06/09/2017  . Enlarged tonsils 11/11/2016  . Tinea pedis of right foot 11/11/2016  . Numbness 11/11/2016  . Positive H. pylori test 07/23/2016  . Chronic nasal congestion 01/18/2015   Past Medical History:  Diagnosis Date  . Cocaine use 2006-2010    Family History  Problem Relation Age of Onset  . Diabetes Neg Hx     Past Surgical History:  Procedure Laterality Date  . No prior surgery      Social History   Occupational History    Comment: [employer]  Tobacco Use  . Smoking status: Former Research scientist (life sciences)  . Smokeless tobacco: Never Used  . Tobacco comment: quit 12 years ago  Substance and Sexual Activity  . Alcohol use: No    Alcohol/week: 0.0 standard drinks    Comment: quit 12 years ago  . Drug use: Yes    Types: Cocaine, Marijuana    Comment: Prior cocaine; none in 11 years  . Sexual activity: Not on file

## 2019-08-18 NOTE — Progress Notes (Signed)
Subjective: He is here for ultrasound-guided right AC joint injection.  Objective: He does have tenderness over the [facility] joint and a positive AC crossover test.  Procedure: Ultrasound-guided right AC joint injection: After sterile prep with Betadine, injected 3 cc 1% lidocaine without epinephrine and 40 mg methylprednisolone using indirect approach ultrasound guidance.

## 2019-09-06 ENCOUNTER — Other Ambulatory Visit: Payer: Self-pay

## 2019-09-06 ENCOUNTER — Ambulatory Visit: Payer: Self-pay

## 2019-09-08 ENCOUNTER — Other Ambulatory Visit: Payer: Self-pay

## 2019-09-08 ENCOUNTER — Ambulatory Visit (INDEPENDENT_AMBULATORY_CARE_PROVIDER_SITE_OTHER): Payer: Self-pay | Admitting: Physical Medicine and Rehabilitation

## 2019-09-08 ENCOUNTER — Encounter: Payer: Self-pay | Admitting: Physical Medicine and Rehabilitation

## 2019-09-08 DIAGNOSIS — R202 Paresthesia of skin: Secondary | ICD-10-CM

## 2019-09-08 MED FILL — FLUTICASONE PROP 50 MCG SPR: 50 | 30 days supply | Qty: 16 | Fill #1

## 2019-09-08 MED FILL — ?ATORVASTATIN 40MG TABLET: 40 | 30 days supply | Qty: 30 | Fill #5

## 2019-09-08 NOTE — Progress Notes (Signed)
 .  Numeric Pain Rating Scale and Functional Assessment Average Pain 8   In the last MONTH (on 0-10 scale) has pain interfered with the following?  1. General activity like being  able to carry out your everyday physical activities such as walking, climbing stairs, carrying groceries, or moving a chair?  Rating(8)   

## 2019-09-08 NOTE — Progress Notes (Signed)
Andrew Sandoval - 39 y.o. male MRN [id number]  Date of birth: 12-Mar-1981  Office Visit Note: Visit Date: 09/08/2019 PCP: Charlott Rakes, MD Referred by: Charlott Rakes, MD  Subjective: Chief Complaint  Patient presents with  . Right Hand - Numbness, Tingling, Pain  . Left Hand - Numbness, Tingling, Follow-up   HPI:  Andrew Sandoval is a 39 y.o. male who comes in today At the request of Dr. Eduard Roux for electrodiagnostic studies of both upper limbs.  Patient is Spanish-speaking we do have an interpreter on the portable Marketing executive.  The normal translator did not show.  Patient is right-hand dominant with pain numbness and tingling in both hands which is chronic and worsening over a significant amount of time.  He has had Naprosyn without any relief.  Has been treated by his primary care doctor as well as Dr. Eduard Roux.  He has not had prior electrodiagnostic studies.  He has no history of diabetes.  No specific radicular complaints.  ROS Otherwise per HPI.  Assessment & Plan: Visit Diagnoses:  1. Paresthesia of skin     Plan: Impression: The above electrodiagnostic study is ABNORMAL and reveals evidence of:  1.  A moderate to severe right median nerve entrapment at the wrist (carpal tunnel syndrome) affecting sensory and motor components.   2.  A moderate left median nerve entrapment at the wrist (carpal tunnel syndrome) affecting sensory and motor components.    There is no significant electrodiagnostic evidence of any other focal nerve entrapment, brachial plexopathy or cervical radiculopathy.   Recommendations: 1.  Follow-up with referring physician. 2.  Continue current management of symptoms. 3.  Suggest surgical evaluation.  Meds & Orders: No orders of the defined types were placed in this encounter.   Orders Placed This Encounter  Procedures  . NCV with EMG (electromyography)    Follow-up: Return for Eduard Roux, MD.   Procedures: No procedures  performed  EMG & NCV Findings: Evaluation of the left median motor nerve showed prolonged distal onset latency (4.6 ms).  The right median motor nerve showed prolonged distal onset latency (5.1 ms) and decreased conduction velocity (Elbow-Wrist, 48 m/s).  The left median (across palm) sensory nerve showed no response (Palm) and prolonged distal peak latency (4.6 ms).  The right median (across palm) sensory nerve showed no response (Palm), prolonged distal peak latency (5.2 ms), and reduced amplitude (9.1 V).  All remaining nerves (as indicated in the following tables) were within normal limits.  All left vs. right side differences were within normal limits.    Impression: The above electrodiagnostic study is ABNORMAL and reveals evidence of:  1.  A moderate to severe right median nerve entrapment at the wrist (carpal tunnel syndrome) affecting sensory and motor components.   2.  A moderate left median nerve entrapment at the wrist (carpal tunnel syndrome) affecting sensory and motor components.    There is no significant electrodiagnostic evidence of any other focal nerve entrapment, brachial plexopathy or cervical radiculopathy.   Recommendations: 1.  Follow-up with referring physician. 2.  Continue current management of symptoms. 3.  Suggest surgical evaluation.  ___________________________ Laurence Spates FAAPMR Board Certified, American Board of Physical Medicine and Rehabilitation    Nerve Conduction Studies Anti Sensory Summary Table   Stim Site NR Peak (ms) Norm Peak (ms) P-T Amp (V) Norm P-T Amp Site1 Site2 Delta-P (ms) Dist (cm) Vel (m/s) Norm Vel (m/s)  Left Median Acr Palm Anti Sensory (2nd Digit)  31.4C  Wrist    *4.6 <3.6 13.5 >10 Wrist Palm  0.0    Palm *NR  <2.0          Right Median Acr Palm Anti Sensory (2nd Digit)  31.6C  Wrist    *5.2 <3.6 *9.1 >10 Wrist Palm  0.0    Palm *NR  <2.0          Left Radial Anti Sensory (Base 1st Digit)  31.8C  Wrist    2.2 <3.1  12.0  Wrist Base 1st Digit 2.2 0.0    Right Radial Anti Sensory (Base 1st Digit)  31.5C  Wrist    2.2 <3.1 28.7  Wrist Base 1st Digit 2.2 0.0    Left Ulnar Anti Sensory (5th Digit)  31.8C  Wrist    3.2 <3.7 19.8 >15.0 Wrist 5th Digit 3.2 14.0 44 >38  Right Ulnar Anti Sensory (5th Digit)  31.7C  Wrist    3.1 <3.7 19.8 >15.0 Wrist 5th Digit 3.1 14.0 45 >38   Motor Summary Table   Stim Site NR Onset (ms) Norm Onset (ms) O-P Amp (mV) Norm O-P Amp Site1 Site2 Delta-0 (ms) Dist (cm) Vel (m/s) Norm Vel (m/s)  Left Median Motor (Abd Poll Brev)  31.8C  Wrist    *4.6 <4.2 7.4 >5 Elbow Wrist 4.0 20.5 51 >50  Elbow    8.6  7.4         Right Median Motor (Abd Poll Brev)  31.4C  Wrist    *5.1 <4.2 7.9 >5 Elbow Wrist 4.4 21.0 *48 >50  Elbow    9.5  7.7         Left Ulnar Motor (Abd Dig Min)  32.2C  Wrist    2.5 <4.2 10.1 >3 B Elbow Wrist 3.4 20.5 60 >53  B Elbow    5.9  10.8  A Elbow B Elbow 1.1 9.0 82 >53  A Elbow    7.0  10.7         Right Ulnar Motor (Abd Dig Min)  31.4C  Wrist    2.6 <4.2 10.0 >3 B Elbow Wrist 3.0 20.5 68 >53  B Elbow    5.6  10.1  A Elbow B Elbow 1.1 10.0 91 >53  A Elbow    6.7  9.9           Nerve Conduction Studies Anti Sensory Left/Right Comparison   Stim Site L Lat (ms) R Lat (ms) L-R Lat (ms) L Amp (V) R Amp (V) L-R Amp (%) Site1 Site2 L Vel (m/s) R Vel (m/s) L-R Vel (m/s)  Median Acr Palm Anti Sensory (2nd Digit)  31.4C  Wrist *4.6 *5.2 0.6 13.5 *9.1 32.6 Wrist Palm     Palm             Radial Anti Sensory (Base 1st Digit)  31.8C  Wrist 2.2 2.2 0.0 12.0 28.7 58.2 Wrist Base 1st Digit     Ulnar Anti Sensory (5th Digit)  31.8C  Wrist 3.2 3.1 0.1 19.8 19.8 0.0 Wrist 5th Digit 44 45 1   Motor Left/Right Comparison   Stim Site L Lat (ms) R Lat (ms) L-R Lat (ms) L Amp (mV) R Amp (mV) L-R Amp (%) Site1 Site2 L Vel (m/s) R Vel (m/s) L-R Vel (m/s)  Median Motor (Abd Poll Brev)  31.8C  Wrist *4.6 *5.1 0.5 7.4 7.9 6.3 Elbow Wrist 51 *48 3  Elbow 8.6 9.5 0.9  7.4 7.7 3.9       Ulnar Motor (  Abd Dig Min)  32.2C  Wrist 2.5 2.6 0.1 10.1 10.0 1.0 B Elbow Wrist 60 68 8  B Elbow 5.9 5.6 0.3 10.8 10.1 6.5 A Elbow B Elbow 82 91 9  A Elbow 7.0 6.7 0.3 10.7 9.9 7.5          Waveforms:                      Clinical History: No specialty comments available.     Objective:  VS:  HT:    WT:   BMI:     BP:   HR: bpm  TEMP: ( )  RESP:  Physical Exam Musculoskeletal:        General: No tenderness.     Comments: Inspection reveals some flattening of the bilateral APB but no atrophy of the bilateral APB or FDI or hand intrinsics. There is no swelling, color changes, allodynia or dystrophic changes. There is 5 out of 5 strength in the bilateral wrist extension, finger abduction and long finger flexion. There is intact sensation to light touch in all dermatomal and peripheral nerve distributions. There is a negative Hoffmann's test bilaterally.  Skin:    General: Skin is warm and dry.     Findings: No erythema or rash.  Neurological:     General: No focal deficit present.     Mental Status: He is alert and oriented to person, place, and time.     Sensory: No sensory deficit.     Motor: No weakness or abnormal muscle tone.     Coordination: Coordination normal.     Gait: Gait normal.  Psychiatric:        Mood and Affect: Mood normal.        Behavior: Behavior normal.        Thought Content: Thought content normal.     Ortho Exam Imaging: No results found.

## 2019-09-08 NOTE — Procedures (Signed)
EMG & NCV Findings: Evaluation of the left median motor nerve showed prolonged distal onset latency (4.6 ms).  The right median motor nerve showed prolonged distal onset latency (5.1 ms) and decreased conduction velocity (Elbow-Wrist, 48 m/s).  The left median (across palm) sensory nerve showed no response (Palm) and prolonged distal peak latency (4.6 ms).  The right median (across palm) sensory nerve showed no response (Palm), prolonged distal peak latency (5.2 ms), and reduced amplitude (9.1 V).  All remaining nerves (as indicated in the following tables) were within normal limits.  All left vs. right side differences were within normal limits.    Impression: The above electrodiagnostic study is ABNORMAL and reveals evidence of:  1.  A moderate to severe right median nerve entrapment at the wrist (carpal tunnel syndrome) affecting sensory and motor components.   2.  A moderate left median nerve entrapment at the wrist (carpal tunnel syndrome) affecting sensory and motor components.    There is no significant electrodiagnostic evidence of any other focal nerve entrapment, brachial plexopathy or cervical radiculopathy.   Recommendations: 1.  Follow-up with referring physician. 2.  Continue current management of symptoms. 3.  Suggest surgical evaluation.  ___________________________ Laurence Spates FAAPMR Board Certified, American Board of Physical Medicine and Rehabilitation    Nerve Conduction Studies Anti Sensory Summary Table   Stim Site NR Peak (ms) Norm Peak (ms) P-T Amp (V) Norm P-T Amp Site1 Site2 Delta-P (ms) Dist (cm) Vel (m/s) Norm Vel (m/s)  Left Median Acr Palm Anti Sensory (2nd Digit)  31.4C  Wrist    *4.6 <3.6 13.5 >10 Wrist Palm  0.0    Palm *NR  <2.0          Right Median Acr Palm Anti Sensory (2nd Digit)  31.6C  Wrist    *5.2 <3.6 *9.1 >10 Wrist Palm  0.0    Palm *NR  <2.0          Left Radial Anti Sensory (Base 1st Digit)  31.8C  Wrist    2.2 <3.1 12.0  Wrist  Base 1st Digit 2.2 0.0    Right Radial Anti Sensory (Base 1st Digit)  31.5C  Wrist    2.2 <3.1 28.7  Wrist Base 1st Digit 2.2 0.0    Left Ulnar Anti Sensory (5th Digit)  31.8C  Wrist    3.2 <3.7 19.8 >15.0 Wrist 5th Digit 3.2 14.0 44 >38  Right Ulnar Anti Sensory (5th Digit)  31.7C  Wrist    3.1 <3.7 19.8 >15.0 Wrist 5th Digit 3.1 14.0 45 >38   Motor Summary Table   Stim Site NR Onset (ms) Norm Onset (ms) O-P Amp (mV) Norm O-P Amp Site1 Site2 Delta-0 (ms) Dist (cm) Vel (m/s) Norm Vel (m/s)  Left Median Motor (Abd Poll Brev)  31.8C  Wrist    *4.6 <4.2 7.4 >5 Elbow Wrist 4.0 20.5 51 >50  Elbow    8.6  7.4         Right Median Motor (Abd Poll Brev)  31.4C  Wrist    *5.1 <4.2 7.9 >5 Elbow Wrist 4.4 21.0 *48 >50  Elbow    9.5  7.7         Left Ulnar Motor (Abd Dig Min)  32.2C  Wrist    2.5 <4.2 10.1 >3 B Elbow Wrist 3.4 20.5 60 >53  B Elbow    5.9  10.8  A Elbow B Elbow 1.1 9.0 82 >53  A Elbow    7.0  10.7  Right Ulnar Motor (Abd Dig Min)  31.4C  Wrist    2.6 <4.2 10.0 >3 B Elbow Wrist 3.0 20.5 68 >53  B Elbow    5.6  10.1  A Elbow B Elbow 1.1 10.0 91 >53  A Elbow    6.7  9.9           Nerve Conduction Studies Anti Sensory Left/Right Comparison   Stim Site L Lat (ms) R Lat (ms) L-R Lat (ms) L Amp (V) R Amp (V) L-R Amp (%) Site1 Site2 L Vel (m/s) R Vel (m/s) L-R Vel (m/s)  Median Acr Palm Anti Sensory (2nd Digit)  31.4C  Wrist *4.6 *5.2 0.6 13.5 *9.1 32.6 Wrist Palm     Palm             Radial Anti Sensory (Base 1st Digit)  31.8C  Wrist 2.2 2.2 0.0 12.0 28.7 58.2 Wrist Base 1st Digit     Ulnar Anti Sensory (5th Digit)  31.8C  Wrist 3.2 3.1 0.1 19.8 19.8 0.0 Wrist 5th Digit 44 45 1   Motor Left/Right Comparison   Stim Site L Lat (ms) R Lat (ms) L-R Lat (ms) L Amp (mV) R Amp (mV) L-R Amp (%) Site1 Site2 L Vel (m/s) R Vel (m/s) L-R Vel (m/s)  Median Motor (Abd Poll Brev)  31.8C  Wrist *4.6 *5.1 0.5 7.4 7.9 6.3 Elbow Wrist 51 *48 3  Elbow 8.6 9.5 0.9 7.4 7.7 3.9        Ulnar Motor (Abd Dig Min)  32.2C  Wrist 2.5 2.6 0.1 10.1 10.0 1.0 B Elbow Wrist 60 68 8  B Elbow 5.9 5.6 0.3 10.8 10.1 6.5 A Elbow B Elbow 82 91 9  A Elbow 7.0 6.7 0.3 10.7 9.9 7.5          Waveforms:

## 2019-09-09 ENCOUNTER — Ambulatory Visit (INDEPENDENT_AMBULATORY_CARE_PROVIDER_SITE_OTHER): Payer: Self-pay | Admitting: Family Medicine

## 2019-09-09 VITALS — BP 125/60 | HR 53 | Wt 161.8 lb

## 2019-09-09 DIAGNOSIS — L309 Dermatitis, unspecified: Secondary | ICD-10-CM

## 2019-09-09 DIAGNOSIS — R21 Rash and other nonspecific skin eruption: Secondary | ICD-10-CM

## 2019-09-09 LAB — POCT SKIN KOH: Skin KOH, POC: NEGATIVE

## 2019-09-09 MED ORDER — HALOBETASOL PROPIONATE 0.05 % EX CREA: TOPICAL_CREAM | Freq: Two times a day (BID) | CUTANEOUS | 0 refills | Status: DC

## 2019-09-09 NOTE — Progress Notes (Signed)
    SUBJECTIVE:   CHIEF COMPLAINT / HPI: Rash on top of feet  Andrew Sandoval came to be seen for a rash on the dorsum of his feet. The one on his right foot has been there 2+ years. It recently showed up on the left feet a couple months ago. He has been to a dermatologist who scraped his skin and he says he was told that this was a fungus, however none of the prescribed creams have helped.The patients endorses having tried different combination of low dose steroid creams and antifungal creams, none of which have improved the rash at all. He does not endorse wearing tight shoes or shoes that irritate his skin. He says he hast tried wearing different types of shoes, nothing has made this better. He is concerned that this may be a skin cancer.    PERTINENT  PMH / PSH: Tinea pedis of right foot   OBJECTIVE:   BP 125/60   Pulse (!) 53   Wt 161 lb 12.8 oz (73.4 kg)   SpO2 99%   BMI 28.66 kg/m   Physical Exam Musculoskeletal:       Feet:      ASSESSMENT/PLAN:   No problem-specific Assessment & Plan notes found for this encounter. Rash on dorsum of feet Patient declined punch biopsy. We performed a KOH prep as we do not have access to the records from the dermatologist he visited. Will prescribe Halobetasol depending on results of the KOH prep.   Blair Heys, Medical Student [place] [facility]

## 2019-09-09 NOTE — Patient Instructions (Addendum)
It was a pleasure meeting you!   Today we checked for fungus on your foot. We are looking at the specimen now under the microscope. We will call you with the results later today.  Depending on those results, we will call in a prescription for you to your pharmacy.   If it is a fungus, we will call in an antifungal cream. If it is not a fungus, we will try a high potency steroid that has not yet been tried.   Please follow up in 1 month if no improvement and we may recommend a biopsy at that time for a definitive diagnosis.  Be well!  Fue Psychiatrist conocerte!  Hoy verificamos si hay hongos en su pie. Estamos mirando el espcimen ahora bajo el microscopio. Lo llamaremos hoy ms tarde para informarle los resultados.  Dependiendo de esos resultados, le enviaremos una receta a su farmacia.  Si es un hongo, solicitaremos una Psychologist, sport and exercise. Si no es un hongo, probaremos un esteroide de alta potencia que an no se ha probado.  Haga un seguimiento en 1 mes si no hay mejora y podemos recomendar una biopsia en ese momento para un diagnstico definitivo.  Que estes bien!

## 2019-09-13 MED FILL — HALOBETASOL PROP 0.05% CRM: 0.05 | 15 days supply | Qty: 30 | Fill #0

## 2019-09-15 ENCOUNTER — Ambulatory Visit (INDEPENDENT_AMBULATORY_CARE_PROVIDER_SITE_OTHER): Payer: Self-pay | Admitting: Orthopaedic Surgery

## 2019-09-15 ENCOUNTER — Other Ambulatory Visit: Payer: Self-pay

## 2019-09-15 ENCOUNTER — Encounter: Payer: Self-pay | Admitting: Orthopaedic Surgery

## 2019-09-15 VITALS — Ht 63.0 in | Wt 161.0 lb

## 2019-09-15 DIAGNOSIS — G5603 Carpal tunnel syndrome, bilateral upper limbs: Secondary | ICD-10-CM

## 2019-09-15 MED ORDER — METHYLPREDNISOLONE ACETATE 40 MG/ML IJ SUSP
40.0000 mg | INTRAMUSCULAR | Status: AC | PRN
  Administered 2019-09-15: 40 mg

## 2019-09-15 MED ORDER — BUPIVACAINE HCL 0.25 % IJ SOLN
0.3300 mL | INTRAMUSCULAR | Status: AC | PRN
  Administered 2019-09-15: .33 mL

## 2019-09-15 MED ORDER — LIDOCAINE HCL 1 % IJ SOLN
1.0000 mL | INTRAMUSCULAR | Status: AC | PRN
  Administered 2019-09-15: 1 mL

## 2019-09-15 NOTE — Progress Notes (Signed)
Office Visit Note   Patient: Andrew Sandoval           Date of Birth: 14-Jul-1980           MRN: [id number] Visit Date: 09/15/2019              Requested by: Charlott Rakes, MD [place],  [place] [id number] PCP: Charlott Rakes, MD   Assessment & Plan: Visit Diagnoses:  1. Bilateral carpal tunnel syndrome     Plan: Impression is moderate to severe carpal tunnel syndrome on the right and moderate carpal tunnel syndrome on the left.  I did discuss cortisone injection versus surgical intervention.  We discussed that surgical intervention is indicated specifically on the right based on the severity of findings.  The patient would like to hold off on surgery for now.  He would like to proceed with bilateral carpal tunnel cortisone injections today.  He will follow-up with [place] as needed.  This was all discussed through a Spanish-speaking interpreter.  Follow-Up Instructions: Return if symptoms worsen or fail to improve.   Orders:  Orders Placed This Encounter  Procedures  . Hand/UE Inj: bilateral carpal tunnel   No orders of the defined types were placed in this encounter.     Procedures: Hand/UE Inj: bilateral carpal tunnel for carpal tunnel syndrome on 09/15/2019 3:33 PM Indications: pain Details: 25 G needle, volar approach Medications (Right): 1 mL lidocaine 1 %; 40 mg methylPREDNISolone acetate 40 MG/ML; 0.33 mL bupivacaine 0.25 % Medications (Left): 40 mg methylPREDNISolone acetate 40 MG/ML; 1 mL lidocaine 1 %; 0.33 mL bupivacaine 0.25 %      Clinical Data: No additional findings.   Subjective: Chief Complaint  Patient presents with  . Right Hand - Pain  . Left Hand - Pain    HPI patient is [name] who presents our clinic today to discuss nerve conduction studies bilateral upper extremities.  Nerve conduction studies from 09/08/2019 show moderate to severe right median nerve entrapment and a moderate left  median nerve entrapment at the wrist.  He has not had previous cortisone injections to either wrist.  His symptoms are far worse on the right than on the left.  Review of Systems as detailed in HPI.  All others reviewed and are negative.   Objective: Vital Signs: Ht 5\' 3"  (1.6 m)   Wt 161 lb (73 kg)   BMI 28.52 kg/m   Physical Exam well-developed well-nourished gentleman in no acute distress.  Alert and oriented x3.  Ortho Exam stable bilateral wrist exams.  Specialty Comments:  No specialty comments available.  Imaging: No new imaging   PMFS History: Patient Active Problem List   Diagnosis Date Noted  . Pain in both hands 08/18/2019  . Acute pain of right shoulder 08/18/2019  . Hyperlipidemia 06/09/2017  . Enlarged tonsils 11/11/2016  . Tinea pedis of right foot 11/11/2016  . Numbness 11/11/2016  . Positive H. pylori test 07/23/2016  . Chronic nasal congestion 01/18/2015   Past Medical History:  Diagnosis Date  . Cocaine use 2006-2010    Family History  Problem Relation Age of Onset  . Diabetes Neg Hx     Past Surgical History:  Procedure Laterality Date  . No prior surgery     Social History   Occupational History    Comment: [employer]  Tobacco Use  . Smoking status: Former Research scientist (life sciences)  . Smokeless tobacco: Never Used  . Tobacco comment: quit 12 years ago  Substance and Sexual Activity  . Alcohol use: No    Alcohol/week: 0.0 standard drinks    Comment: quit 12 years ago  . Drug use: Yes    Types: Cocaine, Marijuana    Comment: Prior cocaine; none in 11 years  . Sexual activity: Not on file

## 2019-10-06 MED FILL — ATORVASTATIN CALCIUM 40 MG: 40 | 30 days supply | Qty: 30 | Fill #6

## 2019-10-14 ENCOUNTER — Other Ambulatory Visit: Payer: Self-pay | Admitting: Family Medicine

## 2019-10-14 MED FILL — FLUTICASONE PROP 50 MCG SPR: 50 | 30 days supply | Qty: 16 | Fill #0

## 2019-10-21 ENCOUNTER — Other Ambulatory Visit (HOSPITAL_COMMUNITY)
Admission: RE | Admit: 2019-10-21 | Discharge: 2019-10-21 | Disposition: A | Discharge status: Home / Self Care | Payer: Self-pay | Source: Ambulatory Visit | Attending: Family Medicine | Admitting: Family Medicine

## 2019-10-21 ENCOUNTER — Ambulatory Visit (INDEPENDENT_AMBULATORY_CARE_PROVIDER_SITE_OTHER): Payer: Self-pay | Admitting: Family Medicine

## 2019-10-21 ENCOUNTER — Other Ambulatory Visit: Payer: Self-pay

## 2019-10-21 VITALS — BP 101/60 | HR 57 | Wt 162.4 lb

## 2019-10-21 DIAGNOSIS — L298 Other pruritus: Secondary | ICD-10-CM | POA: Insufficient documentation

## 2019-10-21 NOTE — Patient Instructions (Signed)
Cuidado de las heridas, en adultos Wound Care, Adult El cuidado correcto de las heridas puede ayudar a Information systems manager y a [place] infecciones y formacin de cicatrices. Adems, puede ayudar a que la cicatrizacin sea ms rpida. Cmo cuidar la herida Cuidados de la herida      Siga las instrucciones del mdico acerca del cuidado de la herida. Asegrese de hacer lo siguiente: ? Lvese las manos con agua y jabn antes de cambiar la venda (vendaje). Use desinfectante para manos si no dispone de [place] y [place]. ? Cambie el vendaje como se lo haya indicado el mdico. ? No retire los puntos (suturas), la goma para cerrar la piel o las tiras [place]. Es posible que estos cierres cutneos deban quedar puestos en la piel durante 2semanas o ms tiempo. Si los bordes de las tiras adhesivas empiezan a despegarse y Therapist, sports, puede recortar los que estn sueltos. No retire las tiras [facility] por completo a menos que el mdico se lo indique.  Controle la zona de la herida todos los das para detectar signos de infeccin. Est atento a los siguientes signos: ? Enrojecimiento, hinchazn o dolor. ? Lquido o sangre. ? Calor. ? Pus o mal olor.  Pregntele al mdico si debe limpiar la herida con agua y [place]. Hacer esto puede incluir lo siguiente: ? Usar una toalla limpia para secar la herida dando palmaditas despus de limpiarla. No se frote ni restriegue la herida. ? Aplicar una crema o un ungento. Hgalo nicamente como se lo haya indicado el mdico. ? Cubrir la incisin con un vendaje limpio.  Pregntele al mdico cundo puede dejar la herida al descubierto.  Mantenga el vendaje seco hasta que el mdico le diga que se lo puede quitar. No tome baos de inmersin, nade, use el jacuzzi ni haga ninguna actividad en la que sumerja la herida en agua hasta que el mdico lo autorice. Pregntele al mdico si puede ducharse. Thurston Pounds solo le permitan darse baos de [place]. Medicamentos   Si le  recetaron un antibitico, una crema o un ungento con antibitico, tmelo o aplqueselo como se lo haya indicado el mdico. No deje de tomar o de usar el antibitico, aunque la afeccin mejore.  Tome los medicamentos de venta libre y los recetados solamente como se lo haya indicado el mdico. Si le recetaron un analgsico, tmelo al menos 40minutos antes de Optometrist el cuidado de la herida, Civil Service fast streamer se lo haya indicado el mdico. Instrucciones generales  Reanude sus actividades normales segn lo indicado por el mdico. Pregntele al mdico qu actividades son seguras.  No se rasque ni se toque la herida.  No consuma ningn producto que contenga nicotina o tabaco, como cigarrillos y Psychologist, sport and exercise. El consumo de esos productos puede demorar la cicatrizacin de la herida. Si necesita ayuda para dejar de fumar, consulte al mdico.  Concurra a todas las visitas de seguimiento como se lo haya indicado el mdico. Esto es importante.  Siga una dieta que incluya protenas, vitaminas A y C y otros alimentos ricos en nutrientes que ayudarn a que la herida cicatrice. ? Los alimentos ricos en protenas incluyen carne, productos lcteos, frijoles y nueces, entre otras fuentes de protena. ? Los alimentos ricos en vitaminaA incluyen zanahorias y verduras de hoja verde oscuro. ? Los alimentos ricos en vitaminaC incluyen ctricos, tomates y [place] frutas y verduras. ? Los alimentos ricos en nutrientes tienen protenas, carbohidratos, grasas, vitaminas o minerales. Consuma una variedad de alimentos saludables, que incluya frutas, verduras  y cereales integrales. Comunquese con un mdico si:  Le aplicaron la antitetnica y tiene hinchazn, dolor intenso, enrojecimiento o hemorragia en el sitio de la inyeccin.  El dolor no se alivia con los medicamentos.  Tiene enrojecimiento, hinchazn o dolor alrededor de la herida.  Observa lquido o sangre que sale de la herida.  La herida se siente caliente  al tacto.  Tiene pus o percibe mal olor que emana de la herida.  Tiene fiebre o siente escalofros.  Siente nuseas o vomita.  Tiene mareos. Solicite ayuda de inmediato si:  Tiene una lnea roja que sale de la herida.  Los bordes de la herida se abren y se separan.  La herida sangra y el sangrado no se detiene con una presin suave.  Tiene una erupcin cutnea.  Se desmaya.  Tiene dificultad para respirar. Resumen  Siempre lvese las manos con agua y jabn antes de cambiar la venda (vendaje).  Para ayudar con la cicatrizacin, coma alimentos ricos en protenas, vitaminas A y C y dems nutrientes.  Controle la herida todos los das para detectar signos de infeccin. Comunquese con el mdico si sospecha que la herida est infectada. Esta informacin no tiene como fin reemplazar el consejo del mdico. Asegrese de hacerle al mdico cualquier pregunta que tenga. Document Revised: 09/08/2017 Document Reviewed: 12/12/2015 Elsevier Patient Education  2020 Elsevier Inc.  

## 2019-10-21 NOTE — Progress Notes (Signed)
    SUBJECTIVE:   CHIEF COMPLAINT / HPI:   [name] is a 39 yo M that present to dermatology clinic for concerns below.  Foot rash Was seen in clinic 09/09/2019 for foot rash with the right being worse than the left. Was give high dose steroid cream. He does not have pain or burning but continues to endorse itchiness. He was using the cream as instructed both in the morning and at night. He would like to proceed with a biopsy today.  PERTINENT  PMH / PSH: Tinea pedis of the right foot  OBJECTIVE:   BP 101/60   Pulse (!) 57   Wt 162 lb 6.4 oz (73.7 kg)   SpO2 99%   BMI 28.77 kg/m   General: Appears well, no acute distress. Age appropriate.   ASSESSMENT/PLAN:   Chronic pruritic rash in adult Has shown visual improvement since last visit. Continues to endorse pruritis. Will biopsy today. Suspect psoriasis v.  Atopic dermatitis.   Punch Biopsy Procedure Note  PRE-OP DIAGNOSIS: Chronic pruritis rash  POST-OP DIAGNOSIS: Chronic pruritis rash  PROCEDURE: Punch biopsy  Performing Physician: Lavonda Jumbo, DO  Procedure:   The area surrounding the skin lesion was prepared with alcohol, lidocaine and betadine usual proper sterile technique steps. The lesion was removed in the usual manner by the biopsy method noted above with a 31mm punch. Hemostasis was assured with pressure and silver nitrate.  The patient tolerated the procedure well. Wound was covered with a bandage. Patient given return precautions and follow up instructions.    Lavonda Jumbo, DO [facility]

## 2019-10-21 NOTE — Assessment & Plan Note (Addendum)
Has shown visual improvement since last visit. Continues to endorse pruritis. Will biopsy today. Suspect psoriasis v.  Atopic dermatitis.   Punch Biopsy Procedure Note  PRE-OP DIAGNOSIS: Chronic pruritis rash  POST-OP DIAGNOSIS: Chronic pruritis rash  PROCEDURE: Punch biopsy  Performing Physician: Lavonda Jumbo, DO  Procedure:   The area surrounding the skin lesion was prepared with alcohol, lidocaine and betadine usual proper sterile technique steps. The lesion was removed in the usual manner by the biopsy method noted above with a 23mm punch. Hemostasis was assured with pressure and silver nitrate.  The patient tolerated the procedure well. Wound was covered with a bandage. Patient given return precautions and follow up instructions.

## 2019-10-27 ENCOUNTER — Telehealth: Payer: Self-pay | Admitting: Family Medicine

## 2019-10-27 MED ORDER — HALOBETASOL PROPIONATE 0.05 % EX CREA: TOPICAL_CREAM | Freq: Two times a day (BID) | CUTANEOUS | 0 refills | Status: DC

## 2019-10-27 MED FILL — HALOBETASOL PROP 0.05% CRM: 0.05 | 10 days supply | Qty: 50 | Fill #0

## 2019-10-27 NOTE — Telephone Encounter (Signed)
[employer] interpreter ID #: [id number]  DOB of the patient confirmed.   Biopsy report discussed - LICHEN SIMPLEX CHRONICUS  I discussed this condition in layman term.  He is already on high potency steroid. Avoid constant scratching.   He endorsed that his skin is better with his steroid and he requested a refill which was given. He can obtain subsequent refills from his PCP.  All questions were answered.

## 2019-11-03 MED FILL — ?ATORVASTATIN 40MG TABLET: 40 | 30 days supply | Qty: 30 | Fill #0

## 2019-12-01 MED FILL — ?ATORVASTATIN 40MG TABLET: 40 | 30 days supply | Qty: 30 | Fill #1

## 2019-12-08 ENCOUNTER — Ambulatory Visit: Payer: Self-pay | Attending: Family Medicine | Admitting: Family Medicine

## 2019-12-08 ENCOUNTER — Encounter: Payer: Self-pay | Admitting: Family Medicine

## 2019-12-08 ENCOUNTER — Other Ambulatory Visit: Payer: Self-pay

## 2019-12-08 VITALS — BP 113/71 | HR 58 | Ht 63.0 in | Wt 164.0 lb

## 2019-12-08 DIAGNOSIS — E663 Overweight: Secondary | ICD-10-CM

## 2019-12-08 DIAGNOSIS — K2900 Acute gastritis without bleeding: Secondary | ICD-10-CM

## 2019-12-08 DIAGNOSIS — K029 Dental caries, unspecified: Secondary | ICD-10-CM

## 2019-12-08 DIAGNOSIS — E7800 Pure hypercholesterolemia, unspecified: Secondary | ICD-10-CM

## 2019-12-08 MED ORDER — OMEPRAZOLE 40 MG PO CPDR: 40.0000 mg | DELAYED_RELEASE_CAPSULE | Freq: Every day | ORAL | 1 refills | Status: DC

## 2019-12-08 MED FILL — OMEPRAZOLE DR 40 MG CAPSULE: 40 | 30 days supply | Qty: 30 | Fill #0

## 2019-12-08 NOTE — Patient Instructions (Signed)
Gastritis - Adultos Gastritis, Adult  La gastritis es una hinchazn (inflamacin) del estmago. La gastritis puede desarrollarse rpidamente (aguda). Tambin puede desarrollarse lentamente con el transcurso del tiempo (crnica). Es importante recibir ayuda para esta afeccin. Si no obtiene ayuda, el estmago puede sangrar y puede desarrollar llagas (lceras) en el estmago. Cules son las causas? Esta afeccin puede ser causada por lo siguiente:  Grmenes que llegan al estmago.  Beber alcohol en exceso.  Los medicamentos que toma.  Demasiado cido en el estmago.  Una enfermedad del intestino o del estmago.  Estrs.  Una reaccin alrgica.  Enfermedad de Crohn.  Algunos tratamientos para el cncer (radiacin). A veces, se desconoce la causa de esta afeccin. Cules son los signos o los sntomas? Los sntomas de esta afeccin incluyen los siguientes:  Dolor en el estmago.  Una sensacin de ardor en el estmago.  Ganas de vomitar (nuseas).  Vmitos.  Sensacin de estar demasiado lleno luego de comer.  Prdida de peso.  Mal aliento.  Vomitar sangre.  Sangre en las heces. Cmo se diagnostica? Esta afeccin puede diagnosticarse en funcin de lo siguiente:  Los antecedentes mdicos y sntomas.  Un examen fsico.  Estudios. Estos pueden incluir los siguientes: ? Anlisis de sangre. ? Pruebas de heces. ? Un procedimiento para observar el interior del estmago (endoscopa alta). ? Un estudio en el cual se toma una muestra de tejido para analizarlo (biopsia). Cmo se trata? El tratamiento de esta afeccin depende de la causa. Es posible que le administren lo siguiente:  Antibiticos si la afeccin es causada por grmenes.  Bloqueadores H2 y medicamentos similares, si la afeccin fue causada por una gran cantidad de cido. Siga estas indicaciones en su casa: Medicamentos  Tome los medicamentos de venta libre y los recetados solamente como se lo haya  indicado el mdico.  Si le recetaron un antibitico, tmelo como se lo haya indicado el mdico. No deje de tomarlo aunque comience a sentirse mejor. Comida y bebida   Haga comidas pequeas y frecuentes en lugar de comidas abundantes.  Evite los alimentos y las bebidas que intensifican los sntomas.  Beba suficiente lquido para mantener la orina de color amarillo plido. Consumo de alcohol  No beba alcohol si: ? El mdico le indica que no lo haga. ? Est embarazada, puede estar embarazada o est tratando de quedar embarazada.  Si bebe alcohol: ? Limite su uso a las siguientes medidas:  De 0 a 1 medida por da para las mujeres.  De 0 a 2 medidas por da para los hombres. ? Est atento a la cantidad de alcohol que hay en las bebidas que toma. En los [place], una medida equivale a una botella de cerveza de 12oz (355ml), un vaso de vino de 5oz (148ml) o un vaso de una bebida alcohlica de alta graduacin de 1oz (44ml). Indicaciones generales  Converse con el mdico sobre maneras de controlar el estrs. Puede realizar ejercicios de respiracin profunda, meditacin o yoga.  No fume ni consuma productos que contengan nicotina ni tabaco. Si necesita ayuda para dejar de fumar, consulte al mdico.  Concurra a todas las visitas de seguimiento como se lo haya indicado el mdico. Esto es importante. Comunquese con un mdico si:  Sus sntomas empeoran.  Los sntomas desaparecen y luego reaparecen. Solicite ayuda inmediatamente si:  Vomita sangre o una sustancia parecida a los granos de caf.  La materia fecal es negra o de color rojo oscuro.  Vomita cada vez que intenta tomar lquidos.    El dolor de estmago empeora.  Tiene fiebre.  No se siente mejor luego de una semana. Resumen  La gastritis es una hinchazn (inflamacin) del estmago.  Debe obtener ayuda para tratar esta afeccin. Si no obtiene ayuda, el estmago puede sangrar y pueden formarse llagas  (lceras).  Esta afeccin se diagnostica mediante los antecedentes mdicos, un examen fsico o estudios.  Puede recibir tratamiento con medicamentos para los grmenes o medicamentos para bloquear la presencia de demasiada cantidad de cido en el estmago. Esta informacin no tiene como fin reemplazar el consejo del mdico. Asegrese de hacerle al mdico cualquier pregunta que tenga. Document Revised: 12/04/2017 Document Reviewed: 12/04/2017 Elsevier Patient Education  2020 Elsevier Inc.  

## 2019-12-08 NOTE — Progress Notes (Signed)
Subjective:  Patient ID: Andrew Sandoval, male    DOB: Aug 29, 1980  Age: 39 y.o. MRN: [id number]  CC: Hyperlipidemia   HPI Andrew Sandoval  is a 39 year old patient with a history of hyperlipidemia who presents today for chronic disease management.  Endorses compliance with his statin but compliance with a low-cholesterol diet cannot be ascertained.  He gets his major form of exercise from physical activity at his job. Requests Dental referral for filing of a molar tooth and also neeeds a cleaning  He has had an 8 day history of a burning pain ever since he 'ate too much'. Denies being constipated and has no nausea/vomiting /diarrhea / reflux. Burning is worsened with drinking coffee. Past Medical History:  Diagnosis Date  . Cocaine use 2006-2010    Past Surgical History:  Procedure Laterality Date  . No prior surgery      Family History  Problem Relation Age of Onset  . Diabetes Neg Hx     Allergies  Allergen Reactions  . Tylenol [Acetaminophen] Hives    Outpatient Medications Prior to Visit  Medication Sig Dispense Refill  . atorvastatin (LIPITOR) 40 MG tablet Take 1 tablet (40 mg total) by mouth daily. 30 tablet 6  . azelastine (ASTELIN) 0.1 % nasal spray Place 2 sprays into both nostrils 2 (two) times daily. 30 mL 0  . fluticasone (FLONASE) 50 MCG/ACT nasal spray PLACE 2 SPRAYS INTO BOTH NOSTRILS DAILY. 16 g 1  . halobetasol (ULTRAVATE) 0.05 % cream Apply topically 2 (two) times daily. 50 g 0  . methocarbamol (ROBAXIN) 500 MG tablet Take 2 tablets (1,000 mg total) by mouth every 8 (eight) hours as needed for muscle spasms. 90 tablet 0  . naproxen (NAPROSYN) 500 MG tablet Take 1 tablet (500 mg total) by mouth 2 (two) times daily. 30 tablet 0   No facility-administered medications prior to visit.     ROS Review of Systems  Constitutional: Negative for activity change and appetite change.  HENT: Negative for sinus pressure and sore throat.   Eyes: Negative  for visual disturbance.  Respiratory: Negative for cough, chest tightness and shortness of breath.   Cardiovascular: Negative for chest pain and leg swelling.  Gastrointestinal: Negative for abdominal distention, abdominal pain, constipation and diarrhea.  Endocrine: Negative.   Genitourinary: Negative for dysuria.  Musculoskeletal: Negative for joint swelling and myalgias.  Skin: Negative for rash.  Allergic/Immunologic: Negative.   Neurological: Negative for weakness, light-headedness and numbness.  Psychiatric/Behavioral: Negative for dysphoric mood and suicidal ideas.    Objective:  BP 113/71   Pulse (!) 58   Ht _0  (1.6 m)   Wt 164 lb (74.4 kg)   SpO2 98%   BMI 29.05 kg/m   BP/Weight 12/08/2019 12/14/8673 09/12/9199  Systolic BP 007 121 -  Diastolic BP 71 60 -  Wt. (Lbs) 164 162.4 161  BMI 29.05 28.77 28.52      Physical Exam Constitutional:      Appearance: He is well-developed.  Neck:     Vascular: No JVD.  Cardiovascular:     Rate and Rhythm: Normal rate.     Heart sounds: Normal heart sounds. No murmur heard.   Pulmonary:     Effort: Pulmonary effort is normal.     Breath sounds: Normal breath sounds. No wheezing or rales.  Chest:     Chest wall: No tenderness.  Abdominal:     General: Bowel sounds are normal. There is no distension.  Palpations: Abdomen is soft. There is no mass.     Tenderness: There is abdominal tenderness.  Musculoskeletal:        General: Normal range of motion.     Right lower leg: No edema.     Left lower leg: No edema.  Neurological:     Mental Status: He is alert and oriented to person, place, and time.  Psychiatric:        Mood and Affect: Mood normal.     CMP Latest Ref Rng & Units 04/14/2019 08/19/2018 10/09/2017  Glucose 65 - 99 mg/dL 95 95 89  BUN 6 - 20 mg/dL _0 Creatinine 0.76 - 1.27 mg/dL 0.94 0.97 1.05  Sodium 134 - 144 mmol/L 140 140 140  Potassium 3.5 - 5.2 mmol/L 4.5 4.2 4.1  Chloride 96 - 106 mmol/L  104 105 101  CO2 20 - 29 mmol/L _1 Calcium 8.7 - 10.2 mg/dL 9.4 9.4 9.5  Total Protein 6.0 - 8.5 g/dL 7.4 - -  Total Bilirubin 0.0 - 1.2 mg/dL 0.6 - -  Alkaline Phos 39 - 117 IU/L 111 - -  AST 0 - 40 IU/L 26 - -  ALT 0 - 44 IU/L 54(H) - -    Lipid Panel     Component Value Date/Time   CHOL 243 (H) 04/14/2019 0954   TRIG 294 (H) 04/14/2019 0954   HDL 36 (L) 04/14/2019 0954   CHOLHDL 6.8 (H) 04/14/2019 0954   LDLCALC 152 (H) 04/14/2019 0954    CBC    Component Value Date/Time   WBC 4.5 06/06/2017 1157   WBC 6.6 05/30/2016 1531   RBC 5.33 06/06/2017 1157   RBC 5.21 05/30/2016 1531   HGB 16.3 06/06/2017 1157   HCT 48.5 06/06/2017 1157   PLT 303 06/06/2017 1157   MCV 91 06/06/2017 1157   MCH 30.6 06/06/2017 1157   MCH 31.7 05/30/2016 1531   MCHC 33.6 06/06/2017 1157   MCHC 34.4 05/30/2016 1531   RDW 13.6 06/06/2017 1157   LYMPHSABS 2.1 06/06/2017 1157   MONOABS 0.4 04/18/2009 1420   EOSABS 0.1 06/06/2017 1157   BASOSABS 0.0 06/06/2017 1157    Lab Results  Component Value Date   HGBA1C 5.4 09/25/2016    Assessment & Plan:  1. Pure hypercholesterolemia Uncontrolled We will send of lipid panel and adjust regimen accordingly - LP+Non-HDL Cholesterol - CMP14+EGFR  2. Overweight (BMI 25.0-29.9) Discussed reducing portion sizes, increasing physical activity to 150 minutes of moderate intensity exercise per week  3. Other acute gastritis without hemorrhage Trial of PPI Avoid inciting agents, late meals, recumbency right after meals - omeprazole (PRILOSEC) 40 MG capsule; Take 1 capsule (40 mg total) by mouth daily.  Dispense: 30 capsule; Refill: 1  4. Dental cavity - Ambulatory referral to Dentistry   Return in about 6 months (around 06/08/2020) for Hypercholesterolemia.      Charlott Rakes, MD, FAAFP. [facility] [place], [place]   12/08/2019, 10:20 AM

## 2019-12-09 ENCOUNTER — Other Ambulatory Visit: Payer: Self-pay | Admitting: Family Medicine

## 2019-12-09 ENCOUNTER — Telehealth: Payer: Self-pay

## 2019-12-09 DIAGNOSIS — E7800 Pure hypercholesterolemia, unspecified: Secondary | ICD-10-CM

## 2019-12-09 LAB — CMP14+EGFR
ALT: 62 IU/L — ABNORMAL HIGH (ref 0–44)
AST: 31 IU/L (ref 0–40)
Albumin/Globulin Ratio: 1.6 (ref 1.2–2.2)
Albumin: 4.4 g/dL (ref 4.0–5.0)
Alkaline Phosphatase: 125 IU/L — ABNORMAL HIGH (ref 48–121)
BUN/Creatinine Ratio: 13 (ref 9–20)
BUN: 12 mg/dL (ref 6–24)
Bilirubin Total: 0.7 mg/dL (ref 0.0–1.2)
CO2: 22 mmol/L (ref 20–29)
Calcium: 9.3 mg/dL (ref 8.7–10.2)
Chloride: 105 mmol/L (ref 96–106)
Creatinine, Ser: 0.92 mg/dL (ref 0.76–1.27)
GFR calc Af Amer: 120 mL/min/{1.73_m2} (ref >59)
GFR calc non Af Amer: 104 mL/min/{1.73_m2} (ref >59)
Globulin, Total: 2.7 g/dL (ref 1.5–4.5)
Glucose: 95 mg/dL (ref 65–99)
Potassium: 4.1 mmol/L (ref 3.5–5.2)
Sodium: 140 mmol/L (ref 134–144)
Total Protein: 7.1 g/dL (ref 6.0–8.5)

## 2019-12-09 LAB — LP+NON-HDL CHOLESTEROL
Cholesterol, Total: 136 mg/dL (ref 100–199)
HDL: 35 mg/dL — ABNORMAL LOW (ref >39)
LDL Chol Calc (NIH): 76 mg/dL (ref 0–99)
Total Non-HDL-Chol (LDL+VLDL): 101 mg/dL (ref 0–129)
Triglycerides: 141 mg/dL (ref 0–149)
VLDL Cholesterol Cal: 25 mg/dL (ref 5–40)

## 2019-12-09 MED ORDER — ATORVASTATIN CALCIUM 40 MG PO TABS: 40.0000 mg | ORAL_TABLET | Freq: Every day | ORAL | 6 refills | Status: DC

## 2019-12-09 NOTE — Telephone Encounter (Signed)
Patient name and DOB has been verified Patient was informed of lab results. Patient had no questions.  

## 2019-12-09 NOTE — Telephone Encounter (Signed)
-----   Message from Hoy Register, MD sent at 12/09/2019  1:40 PM EDT ----- Cholesterol has improved significantly.  Please advise him to continue with his Lipitor low-cholesterol diet.

## 2020-01-05 MED FILL — ATORVASTATIN CALCIUM 40 MG: 40 | 30 days supply | Qty: 30 | Fill #0

## 2020-01-05 MED FILL — OMEPRAZOLE DR 40 MG CAPSULE: 40 | 30 days supply | Qty: 30 | Fill #1

## 2020-01-05 MED FILL — FLUTICASONE PROP 50 MCG SPR: 50 | 30 days supply | Qty: 16 | Fill #1

## 2020-01-13 MED FILL — ?NAPROXEN 500 MG TABS: 500 | 30 days supply | Qty: 60 | Fill #1

## 2020-02-02 ENCOUNTER — Other Ambulatory Visit: Payer: Self-pay

## 2020-02-02 ENCOUNTER — Encounter: Payer: Self-pay | Admitting: Critical Care Medicine

## 2020-02-02 ENCOUNTER — Ambulatory Visit: Payer: Self-pay | Attending: Critical Care Medicine | Admitting: Critical Care Medicine

## 2020-02-02 DIAGNOSIS — G5603 Carpal tunnel syndrome, bilateral upper limbs: Secondary | ICD-10-CM | POA: Insufficient documentation

## 2020-02-02 DIAGNOSIS — G5601 Carpal tunnel syndrome, right upper limb: Secondary | ICD-10-CM | POA: Insufficient documentation

## 2020-02-02 DIAGNOSIS — E7800 Pure hypercholesterolemia, unspecified: Secondary | ICD-10-CM

## 2020-02-02 DIAGNOSIS — M79602 Pain in left arm: Secondary | ICD-10-CM

## 2020-02-02 MED ORDER — MELOXICAM 15 MG PO TABS
15.0000 mg | ORAL_TABLET | Freq: Every day | ORAL | 0 refills | Status: DC
Start: 2020-02-02 — End: 2020-04-03

## 2020-02-02 MED FILL — MELOXICAM 15 MG TABLET: 15 | 30 days supply | Qty: 30 | Fill #0

## 2020-02-02 MED FILL — ATORVASTATIN CALCIUM 40 MG: 40 | 30 days supply | Qty: 30 | Fill #1

## 2020-02-02 NOTE — Progress Notes (Signed)
Patient is having pain on his left wrist.

## 2020-02-02 NOTE — Progress Notes (Signed)
Subjective:    Patient ID: Andrew Sandoval, male    DOB: 11-03-1980, 39 y.o.   MRN: [id number] Virtual Visit via Telephone Note  I connected with Andrew Sandoval on 02/02/20 at  8:30 AM EDT by telephone and verified that I am speaking with the correct person using two identifiers.   Consent:  I discussed the limitations, risks, security and privacy concerns of performing an evaluation and management service by telephone and the availability of in person appointments. I also discussed with the patient that there may be a patient responsible charge related to this service. The patient expressed understanding and agreed to proceed.  Location of patient: Patient at home Location of provider: I am in my office  Persons participating in the televisit with the patient.    The interpreter Andrew Sandoval from [employer] was on the call providing Spanish interpretation   History of Present Illness:  02/02/2020 This is a primary care patient of Dr. Baxter Flattery who comes in today by way of a telephone telemedicine visit.  The patient has bilateral wrist pain it has been going on for several months.  The patient was referred in April to orthopedics and he received cortisone injections to both wrists.  He was recommended surgery because he has severe bilateral carpal tunnel syndrome.  At that time the patient refused surgery.  He wishes to go back to orthopedics today he states however he is still declining surgery.  The patient states he has numbness in the hands weakness in the hands he works in [employer] and uses his hands continuously.  He also wishes to have his lipid panel rechecked.  Patient has history of morbid obesity and hyperlipidemia.  Notes from the orthopedic office reviewed from April and showed he had bilateral wrist injections with cortisone patient said he had some relief with this but it rapidly deteriorated and he has not tried any wrist splints   Past Medical History:    Diagnosis Date  . Cocaine use 2006-2010     Family History  Problem Relation Age of Onset  . Diabetes Neg Hx      Social History   Socioeconomic History  . Marital status: Single    Spouse name: Not on file  . Number of children: Not on file  . Years of education: Not on file  . Highest education level: Not on file  Occupational History    Comment: [employer]  Tobacco Use  . Smoking status: Former Games developer  . Smokeless tobacco: Never Used  . Tobacco comment: quit 12 years ago  Vaping Use  . Vaping Use: Never used  Substance and Sexual Activity  . Alcohol use: No    Alcohol/week: 0.0 standard drinks    Comment: quit 12 years ago  . Drug use: Yes    Types: Cocaine, Marijuana    Comment: Prior cocaine; none in 11 years  . Sexual activity: Not on file  Other Topics Concern  . Not on file  Social History Narrative  . Not on file   Social Determinants of Health   Financial Resource Strain:   . Difficulty of Paying Living Expenses: Not on file  Food Insecurity:   . Worried About Programme researcher, broadcasting/film/video in the Last Year: Not on file  . Ran Out of Food in the Last Year: Not on file  Transportation Needs:   . Lack of Transportation (Medical): Not on file  . Lack of Transportation (Non-Medical): Not on file  Physical Activity:   .  Days of Exercise per Week: Not on file  . Minutes of Exercise per Session: Not on file  Stress:   . Feeling of Stress : Not on file  Social Connections:   . Frequency of Communication with Friends and Family: Not on file  . Frequency of Social Gatherings with Friends and Family: Not on file  . Attends Religious Services: Not on file  . Active Member of Clubs or Organizations: Not on file  . Attends Banker Meetings: Not on file  . Marital Status: Not on file  Intimate Partner Violence:   . Fear of Current or Ex-Partner: Not on file  . Emotionally Abused: Not on file  . Physically Abused: Not on file  . Sexually Abused: Not on file      Allergies  Allergen Reactions  . Tylenol [Acetaminophen] Hives     Outpatient Medications Prior to Visit  Medication Sig Dispense Refill  . atorvastatin (LIPITOR) 40 MG tablet Take 1 tablet (40 mg total) by mouth daily. 30 tablet 6  . azelastine (ASTELIN) 0.1 % nasal spray Place 2 sprays into both nostrils 2 (two) times daily. 30 mL 0  . fluticasone (FLONASE) 50 MCG/ACT nasal spray PLACE 2 SPRAYS INTO BOTH NOSTRILS DAILY. 16 g 1  . halobetasol (ULTRAVATE) 0.05 % cream Apply topically 2 (two) times daily. 50 g 0  . methocarbamol (ROBAXIN) 500 MG tablet Take 2 tablets (1,000 mg total) by mouth every 8 (eight) hours as needed for muscle spasms. 90 tablet 0  . naproxen (NAPROSYN) 500 MG tablet Take 1 tablet (500 mg total) by mouth 2 (two) times daily. 30 tablet 0  . omeprazole (PRILOSEC) 40 MG capsule Take 1 capsule (40 mg total) by mouth daily. 30 capsule 1   No facility-administered medications prior to visit.     Review of Systems  HENT: Negative.   Respiratory: Negative.   Cardiovascular: Negative.   Gastrointestinal: Negative.   Musculoskeletal:       Bilateral wrist pain  Skin: Negative.   Psychiatric/Behavioral: Negative.        Objective:   Physical Exam There were no vitals filed for this visit. No exam this is a phone visit      Assessment & Plan:  I personally reviewed all images and lab data in the [facility] system as well as any outside material available during this office visit and agree with the  radiology impressions.   No problem-specific Assessment & Plan notes found for this encounter.   Diagnoses and all orders for this visit:  Left arm pain    Follow Up Instructions:    I discussed the assessment and treatment plan with the patient. The patient was provided an opportunity to ask questions and all were answered. The patient agreed with the plan and demonstrated an understanding of the instructions.   The patient was advised to call back or seek  an in-person evaluation if the symptoms worsen or if the condition fails to improve as anticipated.  I provided 30 minutes of non-face-to-face time during this encounter  including  median intraservice time , review of notes, labs, imaging, medications  and explaining diagnosis and management to the patient .    Shan Levans, MD

## 2020-02-02 NOTE — Assessment & Plan Note (Signed)
The patient is on statin therapy will bring him in for lipid assessment

## 2020-02-02 NOTE — Assessment & Plan Note (Signed)
Bilateral carpal tunnel syndrome which will need further evaluation  I recommended the patient be referred back to orthopedics and referral was placed at this time also gave the patient a prescription for meloxicam 15 mg daily to see if this will help in the interim and I told him to get wrist splints to wear at night when sleeping to keep the wrist in a neutral position

## 2020-02-03 LAB — LIPID PANEL
Chol/HDL Ratio: 3 ratio (ref 0.0–5.0)
Cholesterol, Total: 125 mg/dL (ref 100–199)
HDL: 41 mg/dL (ref >39)
LDL Chol Calc (NIH): 63 mg/dL (ref 0–99)
Triglycerides: 115 mg/dL (ref 0–149)
VLDL Cholesterol Cal: 21 mg/dL (ref 5–40)

## 2020-02-06 ENCOUNTER — Telehealth: Payer: Self-pay

## 2020-02-06 NOTE — Telephone Encounter (Signed)
[employer] Marcela  Id# [id number]  contacted pt to go over lab results pt didn't answer lvm asking pt to give a call back Monday to receive results  

## 2020-02-09 ENCOUNTER — Ambulatory Visit: Payer: Self-pay | Admitting: Orthopaedic Surgery

## 2020-02-13 ENCOUNTER — Ambulatory Visit (HOSPITAL_COMMUNITY)
Admission: EM | Admit: 2020-02-13 | Discharge: 2020-02-13 | Disposition: A | Discharge status: Home / Self Care | Payer: Self-pay | Attending: Emergency Medicine | Admitting: Emergency Medicine

## 2020-02-13 ENCOUNTER — Other Ambulatory Visit: Payer: Self-pay

## 2020-02-13 ENCOUNTER — Encounter (HOSPITAL_COMMUNITY): Payer: Self-pay

## 2020-02-13 DIAGNOSIS — H60502 Unspecified acute noninfective otitis externa, left ear: Secondary | ICD-10-CM

## 2020-02-13 MED ORDER — NEOMYCIN-POLYMYXIN-HC 3.5-10000-1 OT SUSP
4.0000 [drp] | Freq: Once | OTIC | Status: AC
  Administered 2020-02-13: 4 [drp] via OTIC

## 2020-02-13 MED ORDER — NEOMYCIN-POLYMYXIN-HC 3.5-10000-1 OT SUSP
OTIC | Status: AC
  Filled 2020-02-13: qty 10

## 2020-02-13 NOTE — ED Triage Notes (Signed)
Pt states left ear pain beginning 3 days ago and worsening. Pt states also slight amount of drainage from same ear. Pt states pain and cannot hear from left ear since yesterday. Pt has no fever. Pt ao x 4 and ambulatory.

## 2020-02-13 NOTE — ED Provider Notes (Signed)
[facility]    CSN: [id number] Arrival date & time: 02/13/20  1007      History   Chief Complaint Chief Complaint  Patient presents with  . Otalgia    Left Ear pain x 3 days    HPI Andrew Sandoval is a 39 y.o. male.   Andrew Sandoval presents with complaints of pain to left ear which started 3 days ago. No fevers. No cough, no runny nose. Maybe small drainage from the ear. Denies any previous similar. Hasn't taken any medications for symptoms.    ROS per HPI, negative if not otherwise mentioned.      Past Medical History:  Diagnosis Date  . Cocaine use 2006-2010    Patient Active Problem List   Diagnosis Date Noted  . Bilateral carpal tunnel syndrome 02/02/2020  . Chronic pruritic rash in adult 10/21/2019  . Pain in both hands 08/18/2019  . Acute pain of right shoulder 08/18/2019  . Hyperlipidemia 06/09/2017  . Enlarged tonsils 11/11/2016  . Tinea pedis of right foot 11/11/2016  . Numbness 11/11/2016  . Positive H. pylori test 07/23/2016  . Chronic nasal congestion 01/18/2015    Past Surgical History:  Procedure Laterality Date  . No prior surgery         Home Medications    Prior to Admission medications   Medication Sig Start Date End Date Taking? Authorizing Provider  atorvastatin (LIPITOR) 40 MG tablet Take 1 tablet (40 mg total) by mouth daily. 12/09/19   Hoy Register, MD  meloxicam (MOBIC) 15 MG tablet Take 1 tablet (15 mg total) by mouth daily. 02/02/20   Storm Frisk, MD  gabapentin (NEURONTIN) 300 MG capsule Take 1 capsule (300 mg total) by mouth at bedtime. Patient not taking: Reported on 05/12/2019 02/17/19 07/07/19  Hoy Register, MD    Family History Family History  Problem Relation Age of Onset  . Diabetes Neg Hx     Social History Social History   Tobacco Use  . Smoking status: Former Games developer  . Smokeless tobacco: Never Used  . Tobacco comment: quit 12 years ago  Vaping Use  . Vaping Use: Never used    Substance Use Topics  . Alcohol use: No    Alcohol/week: 0.0 standard drinks    Comment: quit 12 years ago  . Drug use: Yes    Types: Cocaine, Marijuana    Comment: Prior cocaine; none in 11 years     Allergies   Tylenol [acetaminophen]   Review of Systems Review of Systems   Physical Exam Triage Vital Signs ED Triage Vitals  Enc Vitals Group     BP 02/13/20 1046 120/80     Pulse Rate 02/13/20 1046 79     Resp 02/13/20 1046 17     Temp 02/13/20 1046 98.4 F (36.9 C)     Temp Source 02/13/20 1046 Oral     SpO2 02/13/20 1046 100 %     Weight --      Height --      Head Circumference --      Peak Flow --      Pain Score 02/13/20 1043 10     Pain Loc --      Pain Edu? --      Excl. in GC? --    No data found.  Updated Vital Signs BP 120/80 (BP Location: Right Arm)   Pulse 79   Temp 98.4 F (36.9 C) (Oral)   Resp 17  SpO2 100%   Visual Acuity Right Eye Distance:   Left Eye Distance:   Bilateral Distance:    Right Eye Near:   Left Eye Near:    Bilateral Near:     Physical Exam Constitutional:      Appearance: He is well-developed.  HENT:     Right Ear: Tympanic membrane and external ear normal.     Left Ear: Drainage, swelling and tenderness present.     Ears:     Comments: Significant left canal swelling with apparent clear drainage, tenderness with exam and movement of the external ear. Unable to visualize TM at this time, no bloody discharge Cardiovascular:     Rate and Rhythm: Normal rate.  Pulmonary:     Effort: Pulmonary effort is normal.  Skin:    General: Skin is warm and dry.  Neurological:     Mental Status: He is alert and oriented to person, place, and time.      UC Treatments / Results  Labs (all labs ordered are listed, but only abnormal results are displayed) Labs Reviewed - No data to display  EKG   Radiology No results found.  Procedures Procedures (including critical care time)  Medications Ordered in  UC Medications  neomycin-polymyxin-hydrocortisone (CORTISPORIN) OTIC (EAR) suspension 4 drop (has no administration in time range)    Initial Impression / Assessment and Plan / UC Course  I have reviewed the triage vital signs and the nursing notes.  Pertinent labs & imaging results that were available during my care of the patient were reviewed by me and considered in my medical decision making (see chart for details).     AOE. Ear drops initiated in clinic today with instruction for use provided. Return precautions provided. Patient verbalized understanding and agreeable to plan.   Final Clinical Impressions(s) / UC Diagnoses   Final diagnoses:  Acute otitis externa of left ear, unspecified type     Discharge Instructions     Use of ear drops, 4 drops, to left ear every 6 hours for the next 7 days.  Don't submerge head underwater or put anything into the ear.  If symptoms worsen or do not improve in the next week to return to be seen or to follow up with your primary care provider.    Uso de gotas para los odos, 4 gotas, en el odo izquierdo cada 6 horas durante los prximos [address]. No sumerja la cabeza bajo el agua ni coloque nada en el odo. Si los sntomas empeoran o no mejoran en la prxima semana, vuelva para que lo vean o para un seguimiento con su proveedor de atencin primaria.    ED Prescriptions    None     PDMP not reviewed this encounter.   Georgetta Haber, NP 02/13/20 1116

## 2020-02-13 NOTE — Discharge Instructions (Signed)
Use of ear drops, 4 drops, to left ear every 6 hours for the next 7 days.  Don't submerge head underwater or put anything into the ear.  If symptoms worsen or do not improve in the next week to return to be seen or to follow up with your primary care provider.    Uso de gotas para los odos, 4 gotas, en el odo izquierdo cada 6 horas durante los prximos [address] No sumerja la cabeza bajo el agua ni coloque nada en el odo. Si los sntomas empeoran o no mejoran en la prxima semana, vuelva para que lo vean o para un seguimiento con su proveedor de atencin primaria.

## 2020-02-22 MED FILL — CLOTRIMAZOLE-BETAMETHASONE: 1-0.05 | 7 days supply | Qty: 30 | Fill #1

## 2020-02-29 ENCOUNTER — Encounter: Payer: Self-pay | Admitting: Orthopaedic Surgery

## 2020-02-29 ENCOUNTER — Ambulatory Visit (INDEPENDENT_AMBULATORY_CARE_PROVIDER_SITE_OTHER): Payer: Self-pay | Admitting: Orthopaedic Surgery

## 2020-02-29 VITALS — Ht 62.5 in | Wt 157.0 lb

## 2020-02-29 DIAGNOSIS — G5603 Carpal tunnel syndrome, bilateral upper limbs: Secondary | ICD-10-CM

## 2020-02-29 MED ORDER — METHYLPREDNISOLONE ACETATE 40 MG/ML IJ SUSP
40.0000 mg | INTRAMUSCULAR | Status: AC | PRN
  Administered 2020-02-29: 40 mg

## 2020-02-29 MED ORDER — BUPIVACAINE HCL 0.25 % IJ SOLN
0.3300 mL | INTRAMUSCULAR | Status: AC | PRN
  Administered 2020-02-29: .33 mL

## 2020-02-29 MED ORDER — LIDOCAINE HCL 1 % IJ SOLN
1.0000 mL | INTRAMUSCULAR | Status: AC | PRN
Start: 2020-02-29 — End: 2020-02-29
  Administered 2020-02-29: 1 mL

## 2020-02-29 MED ORDER — LIDOCAINE HCL 1 % IJ SOLN
1.0000 mL | INTRAMUSCULAR | Status: AC | PRN
  Administered 2020-02-29: 1 mL

## 2020-02-29 NOTE — Progress Notes (Signed)
Office Visit Note   Patient: Andrew Sandoval           Date of Birth: [birth year]           MRN: [id number] Visit Date: 02/29/2020              Requested by: Storm Frisk, MD [address] [place],  [place] [address] PCP: Hoy Register, MD   Assessment & Plan: Visit Diagnoses:  1. Bilateral carpal tunnel syndrome     Plan: Impression is bilateral carpal tunnel syndrome severe on the right and moderate on the left.  We have again discussed surgical intervention especially to the right given the severity of findings on nerve conduction study.  He would like to think about this for a while longer but go ahead and proceed with repeat bilateral cortisone injections today.  He will follow up with [place] as needed.  Follow-Up Instructions: Return if symptoms worsen or fail to improve.   Orders:  Orders Placed This Encounter  Procedures  . Hand/UE Inj: bilateral carpal tunnel   No orders of the defined types were placed in this encounter.     Procedures: Hand/UE Inj: bilateral carpal tunnel for carpal tunnel syndrome on 02/29/2020 2:29 PM Indications: pain Details: 25 G needle, volar approach Medications (Right): 1 mL lidocaine 1 %; 40 mg methylPREDNISolone acetate 40 MG/ML; 0.33 mL bupivacaine 0.25 % Medications (Left): 40 mg methylPREDNISolone acetate 40 MG/ML; 1 mL lidocaine 1 %; 0.33 mL bupivacaine 0.25 %      Clinical Data: No additional findings.   Subjective: Chief Complaint  Patient presents with  . Right Hand - Pain  . Left Hand - Pain    HPI patient is a pleasant 39 year old gentleman who comes in today with recurrent bilateral carpal tunnel syndrome.  Nerve conduction studies from few months ago showed severe median nerve compression on the right and moderate on the left.  He was seen by [place] on 09/15/2019 where both carpal tunnels were injected with cortisone.  He had good relief of symptoms but only lasted for about 2 months.  His symptoms have returned.   He has shocking sensations as well as tingling to both hands.  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  He has tried wrist splints without relief of symptoms.  Review of Systems as detailed in HPI.  All others reviewed and are negative.   Objective: Vital Signs: Ht 5' 2.5" (1.588 m)   Wt 157 lb (71.2 kg)   BMI 28.26 kg/m   Physical Exam well-developed well-nourished gentleman no acute distress.  Alert oriented x3.  Ortho Exam bilateral wrist exam shows positive Phalen and positive Tinel both sides.  Decreased sensation to the median nerve distribution.  No thenar atrophy.  He is neurovascular intact distally.  Specialty Comments:  No specialty comments available.  Imaging: No new imaging   PMFS History: Patient Active Problem List   Diagnosis Date Noted  . Bilateral carpal tunnel syndrome 02/02/2020  . Chronic pruritic rash in adult 10/21/2019  . Pain in both hands 08/18/2019  . Acute pain of right shoulder 08/18/2019  . Hyperlipidemia 06/09/2017  . Enlarged tonsils 11/11/2016  . Tinea pedis of right foot 11/11/2016  . Numbness 11/11/2016  . Positive H. pylori test 07/23/2016  . Chronic nasal congestion 01/18/2015   Past Medical History:  Diagnosis Date  . Cocaine use 2006-2010    Family History  Problem Relation Age of  Onset  . Diabetes Neg Hx     Past Surgical History:  Procedure Laterality Date  . No prior surgery     Social History   Occupational History    Comment: [employer]  Tobacco Use  . Smoking status: Former Games developer  . Smokeless tobacco: Never Used  . Tobacco comment: quit 12 years ago  Vaping Use  . Vaping Use: Never used  Substance and Sexual Activity  . Alcohol use: No    Alcohol/week: 0.0 standard drinks    Comment: quit 12 years ago  . Drug use: Yes    Types: Cocaine, Marijuana    Comment: Prior cocaine; none in 11 years  . Sexual activity: Not on file

## 2020-03-07 ENCOUNTER — Other Ambulatory Visit: Payer: Self-pay | Admitting: Family Medicine

## 2020-03-07 ENCOUNTER — Ambulatory Visit: Payer: Self-pay

## 2020-03-07 MED FILL — ?ATORVASTATIN 40MG TABLET: 40 | 30 days supply | Qty: 30 | Fill #2

## 2020-03-29 ENCOUNTER — Telehealth: Payer: Self-pay | Admitting: Family Medicine

## 2020-03-29 DIAGNOSIS — G5603 Carpal tunnel syndrome, bilateral upper limbs: Secondary | ICD-10-CM

## 2020-03-31 ENCOUNTER — Telehealth: Payer: Self-pay | Admitting: Family Medicine

## 2020-03-31 NOTE — Telephone Encounter (Signed)
Pt called in to follow up on refill request. Advised pt that medication was denied. Pt says that he still need this medication and would like to know if provider could provider a Rx ? Pt would like to know if an apt is needed?    CB: [phone] --please advise.    Pharmacy:  [facility] - [place], [place] - [address] Phone:  [phone]  Fax:  [phone]

## 2020-03-31 NOTE — Telephone Encounter (Signed)
See TE encounter 

## 2020-03-31 NOTE — Telephone Encounter (Signed)
Called patient with interpreter # [phone] [facility] Patient is requesting medication Naprosyn 500 mg refill.  This medication has been ordered in past for numbness in his fingers and pain in his shoulders. He states he gets steroids for his numb fingers and they are not a problem. He states both his shoulders are painful . His rt shoulder is worse that left. Patient was informed that the doctor will have to decide if the medication is aporopriate and ok to reorder. Patient has scheduled appointment 06/06/20 .

## 2020-04-03 ENCOUNTER — Other Ambulatory Visit: Payer: Self-pay | Admitting: Critical Care Medicine

## 2020-04-03 ENCOUNTER — Other Ambulatory Visit: Payer: Self-pay | Admitting: Family Medicine

## 2020-04-03 DIAGNOSIS — G5603 Carpal tunnel syndrome, bilateral upper limbs: Secondary | ICD-10-CM

## 2020-04-03 MED ORDER — NAPROXEN 500 MG PO TABS: 500.0000 mg | ORAL_TABLET | Freq: Two times a day (BID) | ORAL | 0 refills | Status: DC

## 2020-04-03 MED FILL — MELOXICAM 15 MG TABLET: 15 | 30 days supply | Qty: 30 | Fill #0

## 2020-04-03 MED FILL — ?ATORVASTATIN 40MG TABLET: 40 | 30 days supply | Qty: 30 | Fill #3

## 2020-04-03 NOTE — Telephone Encounter (Signed)
Patient has orders for meloxicam. Naproxen is not on his list. Will forward request to provider covering for Dr. Alvis Lemmings to review.

## 2020-04-03 NOTE — Addendum Note (Signed)
Addended by: Jonah Blue B on: 04/03/2020 09:17 PM   Modules accepted: Orders

## 2020-04-03 NOTE — Telephone Encounter (Signed)
Requested Prescriptions  Pending Prescriptions Disp Refills  . meloxicam (MOBIC) 15 MG tablet [Pharmacy Med Name: MELOXICAM 15 MG TABLET 15 Tablet] 30 tablet 0    Sig: TAKE 1 TABLET (15 MG TOTAL) BY MOUTH DAILY.     Analgesics:  COX2 Inhibitors Failed - 04/03/2020 10:08 AM      Failed - HGB in normal range and within 360 days    Hemoglobin  Date Value Ref Range Status  06/06/2017 16.3 13.0 - 17.7 g/dL Final         Passed - Cr in normal range and within 360 days    Creat  Date Value Ref Range Status  05/30/2016 1.05 0.60 - 1.35 mg/dL Final   Creatinine, Ser  Date Value Ref Range Status  12/08/2019 0.92 0.76 - 1.27 mg/dL Final         Passed - Patient is not pregnant      Passed - Valid encounter within last 12 months    Recent Outpatient Visits          2 months ago Bilateral carpal tunnel syndrome   [facility] Storm Frisk, MD   3 months ago Overweight (BMI 25.0-29.9)   [facility] Hoy Register, MD   7 months ago Bilateral carpal tunnel syndrome   [facility] [place], [place], [place]   9 months ago Tinnitus of both ears   [facility] [place], Odette Horns, MD   10 months ago Rash   [facility] [place], [place], [place]      Future Appointments            In 2 months Hoy Register, MD [facility]

## 2020-04-04 ENCOUNTER — Other Ambulatory Visit: Payer: Self-pay

## 2020-04-04 ENCOUNTER — Ambulatory Visit: Payer: Self-pay | Attending: Nurse Practitioner | Admitting: Nurse Practitioner

## 2020-04-04 ENCOUNTER — Other Ambulatory Visit: Payer: Self-pay | Admitting: Nurse Practitioner

## 2020-04-04 ENCOUNTER — Encounter: Payer: Self-pay | Admitting: Nurse Practitioner

## 2020-04-04 VITALS — BP 116/71 | HR 68 | Temp 97.7°F | Ht 62.5 in | Wt 157.0 lb

## 2020-04-04 DIAGNOSIS — G8929 Other chronic pain: Secondary | ICD-10-CM

## 2020-04-04 DIAGNOSIS — H9191 Unspecified hearing loss, right ear: Secondary | ICD-10-CM

## 2020-04-04 DIAGNOSIS — M25511 Pain in right shoulder: Secondary | ICD-10-CM

## 2020-04-04 MED ORDER — NAPROXEN 500 MG PO TABS: 500.0000 mg | ORAL_TABLET | Freq: Two times a day (BID) | ORAL | 1 refills | Status: DC

## 2020-04-04 MED FILL — ?ATORVASTATIN 40MG TABLET: 40 | 30 days supply | Qty: 30 | Fill #4

## 2020-04-04 MED FILL — ?NAPROXEN 500 MG TABS: 500 | 15 days supply | Qty: 30 | Fill #0

## 2020-04-04 NOTE — Progress Notes (Signed)
Assessment & Plan:  Andrew Sandoval was seen today for ear pain.  Diagnoses and all orders for this visit:  Decreased hearing of right ear Normal exam  Chronic right shoulder pain -     naproxen (NAPROSYN) 500 MG tablet; Take 1 tablet (500 mg total) by mouth 2 (two) times daily with a meal. Referred patient back to Andrew Sandoval   Patient has been counseled on age-appropriate routine health concerns for screening and prevention. These are reviewed and up-to-date. Referrals have been placed accordingly. Immunizations are up-to-date or declined.    Subjective:   Chief Complaint  Patient presents with   Ear Pain    Pt. stated his right ear feels block. Pt. stated sometime he can't hear well from his right ear.    HPI Andrew Sandoval 39 y.o. male presents to office today with complaints of decreased hearing in the right ear.  VRI was used to communicate directly with patient for the entire encounter including providing detailed patient instructions.   Decreased Hearing Intermittent hearing loss in right ear. I was able to easily visualize both TM today.   Right Shoulder pain Re occurring. He was seen by ortho in the past and required a steroid injection. Also with B/L Carpal Tunnel syndrome. Will refill naproxen. He states it seems to work better than meloxicam.  Per Ortho Note 08-18-2019 Plan: My impression is right AC joint arthrosis and bilateral carpal tunnel syndrome.  Patient underwent an ultrasound-guided right AC joint injection with Andrew Sandoval today.  We will obtain nerve conduction studies to evaluate for carpal tunnel syndrome.  Patient will follow-up with [place] after the studies.  Today's encounter was performed through an interpreter which increased the complexity of the visit. Follow-Up Instructions: Return if symptoms worsen or fail to improve.   ROS  Past Medical History:  Diagnosis Date   Cocaine use 2006-2010    Past Surgical History:  Procedure Laterality Date   No  prior surgery      Family History  Problem Relation Age of Onset   Diabetes Neg Hx     Social History Reviewed with no changes to be made today.   Outpatient Medications Prior to Visit  Medication Sig Dispense Refill   atorvastatin (LIPITOR) 40 MG tablet Take 1 tablet (40 mg total) by mouth daily. 30 tablet 6   meloxicam (MOBIC) 15 MG tablet Take 15 mg by mouth daily.     naproxen (NAPROSYN) 500 MG tablet Take 1 tablet (500 mg total) by mouth 2 (two) times daily with a meal. (Patient not taking: Reported on 04/04/2020) 30 tablet 0   No facility-administered medications prior to visit.    Allergies  Allergen Reactions   Tylenol [Acetaminophen] Hives       Objective:    BP 116/71 (BP Location: Left Arm, Patient Position: Sitting, Cuff Size: Normal)    Pulse 68    Temp 97.7 F (36.5 C) (Temporal)    Ht 5' 2.5" (1.588 m)    Wt 157 lb (71.2 kg)    SpO2 97%    BMI 28.26 kg/m  Wt Readings from Last 3 Encounters:  04/04/20 157 lb (71.2 kg)  02/29/20 157 lb (71.2 kg)  12/08/19 164 lb (74.4 kg)    Physical Exam Vitals and nursing note reviewed.  Constitutional:      Appearance: He is well-developed.  HENT:     Head: Normocephalic and atraumatic.     Right Ear: Hearing, tympanic membrane, ear canal and external ear  normal.     Left Ear: Hearing, tympanic membrane, ear canal and external ear normal.  Cardiovascular:     Rate and Rhythm: Normal rate and regular rhythm.     Heart sounds: Normal heart sounds. No murmur heard.  No friction rub. No gallop.   Pulmonary:     Effort: Pulmonary effort is normal. No tachypnea or respiratory distress.     Breath sounds: Normal breath sounds. No decreased breath sounds, wheezing, rhonchi or rales.  Chest:     Chest wall: No tenderness.  Abdominal:     General: Bowel sounds are normal.     Palpations: Abdomen is soft.  Musculoskeletal:        General: Normal range of motion.     Cervical back: Normal range of motion.  Skin:     General: Skin is warm and dry.  Neurological:     Mental Status: He is alert and oriented to person, place, and time.     Coordination: Coordination normal.  Psychiatric:        Behavior: Behavior normal. Behavior is cooperative.        Thought Content: Thought content normal.        Judgment: Judgment normal.          Patient has been counseled extensively about nutrition and exercise as well as the importance of adherence with medications and regular follow-up. The patient was given clear instructions to go to ER or return to medical center if symptoms don't improve, worsen or new problems develop. The patient verbalized understanding.   Follow-up: Return if symptoms worsen or fail to improve, for NEEDS ORANGE CARD/CAFA APP.   Andrew Rigg, FNP-BC [facility] [place], [place] [phone]   04/04/2020, 3:04 PM

## 2020-04-04 NOTE — Telephone Encounter (Signed)
Made pt aware

## 2020-04-12 ENCOUNTER — Other Ambulatory Visit: Payer: Self-pay

## 2020-04-12 ENCOUNTER — Ambulatory Visit (INDEPENDENT_AMBULATORY_CARE_PROVIDER_SITE_OTHER): Payer: Self-pay | Admitting: Orthopaedic Surgery

## 2020-04-12 ENCOUNTER — Ambulatory Visit: Payer: Self-pay

## 2020-04-12 ENCOUNTER — Ambulatory Visit (INDEPENDENT_AMBULATORY_CARE_PROVIDER_SITE_OTHER): Payer: Self-pay

## 2020-04-12 DIAGNOSIS — G8929 Other chronic pain: Secondary | ICD-10-CM

## 2020-04-12 DIAGNOSIS — M25511 Pain in right shoulder: Secondary | ICD-10-CM

## 2020-04-12 NOTE — Progress Notes (Signed)
Office Visit Note   Patient: Andrew Sandoval           Date of Birth: [birth year]           MRN: [id number] Visit Date: 04/12/2020              Requested by: Andrew Register, MD [address] [place],  [place] [address] PCP: Andrew Register, MD   Assessment & Plan: Visit Diagnoses:  1. Chronic right shoulder pain     Plan: Impression is right shoulder AC joint arthropathy.  We will refer the patient back to Andrew Sandoval for ultrasound-guided cortisone injection.  We briefly discussed right shoulder arthroscopic distal clavicle excision should his injections fail to provide long-lasting relief.  He will follow up with [place] as needed.  Follow-Up Instructions: Return for with Andrew Sandoval for right shoulder AC joint injection.   Orders:  Orders Placed This Encounter  Procedures   XR Shoulder Right   No orders of the defined types were placed in this encounter.     Procedures: No procedures performed   Clinical Data: No additional findings.   Subjective: Chief Complaint  Patient presents with   Right Shoulder - Pain    HPI patient is [name] who comes in today with an interpreter.  He is here for recurrent right shoulder pain.  He has been dealing with this for over a year.  He was seen in our office in March of this year where he was referred to Andrew Sandoval for [facility] joint injection.  This significantly helped for about 5 months.  His pain has returned and is starting to worsen.  He has increased pain when bringing his arm across his chest as well as with abduction of the shoulder.  He has been using icy hot patches with mild relief of symptoms.  Review of Systems as detailed in HPI.  All others reviewed and are negative.   Objective: Vital Signs: There were no vitals taken for this visit.  Physical Exam well-developed well-nourished gentleman in no acute distress.  Alert and oriented x3.  Ortho Exam right shoulder exam reveals  full forward flexion.  He can internally rotate to T12.  Moderate tenderness to the [facility] joint.  Positive cross body adduction.  Negative empty can.  He is neurovascular intact distally.  Specialty Comments:  No specialty comments available.  Imaging: XR Shoulder Right  Result Date: 04/12/2020 [facility] joint space narrowing.  Otherwise, no acute findings    PMFS History: Patient Active Problem List   Diagnosis Date Noted   Bilateral carpal tunnel syndrome 02/02/2020   Chronic pruritic rash in adult 10/21/2019   Pain in both hands 08/18/2019   Acute pain of right shoulder 08/18/2019   Hyperlipidemia 06/09/2017   Enlarged tonsils 11/11/2016   Tinea pedis of right foot 11/11/2016   Numbness 11/11/2016   Positive H. pylori test 07/23/2016   Chronic nasal congestion 01/18/2015   Past Medical History:  Diagnosis Date   Cocaine use 2006-2010    Family History  Problem Relation Age of Onset   Diabetes Neg Hx     Past Surgical History:  Procedure Laterality Date   No prior surgery     Social History   Occupational History    Comment: [employer]  Tobacco Use   Smoking status: Former Smoker   Smokeless tobacco: Never Used   Tobacco comment: quit 12 years ago  Vaping Use   Vaping Use: Never used  Substance  and Sexual Activity   Alcohol use: No    Alcohol/week: 0.0 standard drinks    Comment: quit 12 years ago   Drug use: Yes    Types: Cocaine, Marijuana    Comment: Prior cocaine; none in 11 years   Sexual activity: Not on file

## 2020-04-18 ENCOUNTER — Other Ambulatory Visit: Payer: Self-pay

## 2020-04-18 ENCOUNTER — Ambulatory Visit (INDEPENDENT_AMBULATORY_CARE_PROVIDER_SITE_OTHER): Payer: Self-pay | Admitting: Family Medicine

## 2020-04-18 ENCOUNTER — Encounter: Payer: Self-pay | Admitting: Family Medicine

## 2020-04-18 ENCOUNTER — Ambulatory Visit: Payer: Self-pay

## 2020-04-18 DIAGNOSIS — M25512 Pain in left shoulder: Secondary | ICD-10-CM

## 2020-04-18 DIAGNOSIS — M25511 Pain in right shoulder: Secondary | ICD-10-CM

## 2020-04-18 NOTE — Progress Notes (Signed)
Office Visit Note   Patient: Andrew Sandoval           Date of Birth: 1980-08-07           MRN: [id number] Visit Date: 04/18/2020 Requested by: Hoy Register, MD [address] [place],  [place] [address] PCP: Hoy Register, MD  Subjective: Chief Complaint  Patient presents with  . Right Shoulder - Pain, Follow-up    AC joint injection    HPI: He is here for right shoulder AC joint injection.  We injected his AC joint last March.  He states that it helped to some degree but then he fell and it has been hurting for a while.  He also asked about his left shoulder.  Apparently it has been hurting more than the right one for the past couple months, no injury.  Pain on the lateral aspect.              ROS:   All other systems were reviewed and are negative.  Objective: Vital Signs: There were no vitals taken for this visit.  Physical Exam:  General:  Alert and oriented, in no acute distress. Pulm:  Breathing unlabored. Psy:  Normal mood, congruent affect. Skin: No erythema Right shoulder: Good range of motion, point tender at the [facility] joint.  Negative AC crossover test. Left shoulder: He has early adhesive capsulitis with abduction of barely 90 degrees, external rotation of about 45 degrees and internal rotation of about 75 degrees.  Pain at the extremes.  Rotator cuff strength is 5/5 throughout.  Tender in the lateral subacromial space.    Imaging: US Guided Needle Placement - No Linked Charges  Result Date: 04/18/2020 Diagnostic ultrasound left shoulder: Long head biceps tendon is located in its groove and there is no fluid in the tendon sheath.  Subscapularis tendon looks normal.  Supraspinatus looks swollen and there is an area that is hypoechoic near the insertion suggesting deep intrasubstance partial tear.  There is thickening of the subacromial/subdeltoid bursa with some fluid in it.  No hyperemia on power Doppler imaging.  Infraspinatus looks intact. Right shoulder  AC joint injection: After sterile prep with Betadine, injected 3 cc 1% lidocaine without epinephrine and 40 mg methylprednisolone using out of plane approach.  Good pain relief during the anesthetic phase. Left shoulder subacromial injection: After sterile prep with Betadine, injected 5 cc 1% lidocaine without epinephrine and 40 mg methylprednisolone using ultrasound to guide needle placement into the subacromial/subdeltoid bursa.  He had very good relief during the anesthetic phase.   Assessment & Plan: 1.  Right shoulder AC joint arthropathy -Injection as above.  2.  Left shoulder impingement with small partial tear of supraspinatus, early adhesive capsulitis. -Discussed options with him and he wants an injection today.  Physical therapy or MRI scan if symptoms do not improve.     Procedures: No procedures performed  No notes on file     PMFS History: Patient Active Problem List   Diagnosis Date Noted  . Bilateral carpal tunnel syndrome 02/02/2020  . Chronic pruritic rash in adult 10/21/2019  . Pain in both hands 08/18/2019  . Acute pain of right shoulder 08/18/2019  . Hyperlipidemia 06/09/2017  . Enlarged tonsils 11/11/2016  . Tinea pedis of right foot 11/11/2016  . Numbness 11/11/2016  . Positive H. pylori test 07/23/2016  . Chronic nasal congestion 01/18/2015   Past Medical History:  Diagnosis Date  . Cocaine use 2006-2010    Family History  Problem  Relation Age of Onset  . Diabetes Neg Hx     Past Surgical History:  Procedure Laterality Date  . No prior surgery     Social History   Occupational History    Comment: [employer]  Tobacco Use  . Smoking status: Former Games developer  . Smokeless tobacco: Never Used  . Tobacco comment: quit 12 years ago  Vaping Use  . Vaping Use: Never used  Substance and Sexual Activity  . Alcohol use: No    Alcohol/week: 0.0 standard drinks    Comment: quit 12 years ago  . Drug use: Yes    Types: Cocaine, Marijuana    Comment:  Prior cocaine; none in 11 years  . Sexual activity: Not on file

## 2020-05-02 ENCOUNTER — Other Ambulatory Visit: Payer: Self-pay | Admitting: Family Medicine

## 2020-05-02 MED FILL — ?ATORVASTATIN 40MG TABLET: 40 | 30 days supply | Qty: 30 | Fill #5

## 2020-05-30 MED FILL — ?ATORVASTATIN 40MG TABLET: 40 | 30 days supply | Qty: 30 | Fill #6

## 2020-06-06 ENCOUNTER — Other Ambulatory Visit: Payer: Self-pay | Admitting: Family Medicine

## 2020-06-06 ENCOUNTER — Encounter: Payer: Self-pay | Admitting: Family Medicine

## 2020-06-06 ENCOUNTER — Other Ambulatory Visit: Payer: Self-pay

## 2020-06-06 ENCOUNTER — Ambulatory Visit: Payer: Self-pay | Attending: Family Medicine | Admitting: Family Medicine

## 2020-06-06 VITALS — BP 120/74 | HR 60 | Ht 62.0 in | Wt 165.8 lb

## 2020-06-06 DIAGNOSIS — E7800 Pure hypercholesterolemia, unspecified: Secondary | ICD-10-CM

## 2020-06-06 DIAGNOSIS — J328 Other chronic sinusitis: Secondary | ICD-10-CM

## 2020-06-06 DIAGNOSIS — Z23 Encounter for immunization: Secondary | ICD-10-CM

## 2020-06-06 DIAGNOSIS — K029 Dental caries, unspecified: Secondary | ICD-10-CM

## 2020-06-06 MED ORDER — FLUTICASONE PROPIONATE 50 MCG/ACT NA SUSP: 2.0000 | Freq: Every day | NASAL | 1 refills | Status: DC

## 2020-06-06 MED ORDER — ATORVASTATIN CALCIUM 40 MG PO TABS: 40.0000 mg | ORAL_TABLET | Freq: Every day | ORAL | 6 refills | Status: DC

## 2020-06-06 MED FILL — FLUTICASONE PROP 50 MCG SPR: 50 | 30 days supply | Qty: 16 | Fill #0

## 2020-06-06 MED FILL — ?ATORVASTATIN 40MG TABLET: 40 | 30 days supply | Qty: 30 | Fill #0

## 2020-06-06 NOTE — Progress Notes (Signed)
Referral to dentist. Requesting nasal spray.

## 2020-06-06 NOTE — Patient Instructions (Signed)
Dolor dental Dental Pain Las causas del dolor dental pueden ser muchas, entre ellas, las siguientes:  Caries.  Infeccin.  La parte interna del diente se llena de pus (absceso).  Lesiones. A veces, la causa del dolor es desconocida. La intensidad del dolor puede variar. Puede ser leve o intenso. Es posible que tenga dolor todo el tiempo o solo cuando:  Energy manager.  Est expuesto a temperaturas calientes o fras.  Come alimentos o tomar bebidas con azcar, por ejemplo, gaseosas o caramelos. Siga estas indicaciones en su casa: Medicamentos  [employer] de venta libre y los recetados solamente como se lo haya indicado el mdico.  Si le recetaron un antibitico, tmelo como se lo haya indicado el mdico. No deje de tomar los medicamentos aunque comience a Actor. Comida y bebida  No consuma los alimentos o las bebidas que le causen Engineer, mining. Estos incluyen: ? Alimentos o bebidas muy calientes o muy fros. ? Alimentos o bebidas dulces o con azcar. Control del dolor y la hinchazn   Haga grgaras con una mezcla de agua y sal 3 o 4veces al Futures trader. Para prepararla, disuelva de media a 1cucharadita de sal en 1taza de agua tibia.  Si se lo indican, aplique hielo sobre la zona dolorida de la cara: ? Ponga el hielo en una bolsa plstica. ? Coloque una [employer] piel y la bolsa de hielo. ? Coloque el hielo durante , 2 a 3veces por da. Cepillarse los dientes  Cepille sus [employer]  veces al da con dentfrico con fluoruro.  Use hilo dental una vez por da.  Use un a dentfrico para dientes sensible como se lo haya indicado el mdico.  Use un cepillo de cerdas suaves. Indicaciones generales  No se aplique calor en la parte externa de la cara.  Controle Nurse, adult. Informe a su mdico si se produce algn cambio.  Concurra a todas las visitas de [address] se lo haya indicado el mdico. Esto es importante. Comunquese con un mdico  si:  El dolor no se alivia con los [employer].  Aparecen nuevos sntomas.  Los sntomas empeoran. Solicite ayuda de inmediato si:  No puede abrir Government social research officer.  Tiene problemas para respirar o tragar.  Tiene fiebre.  Tiene hinchazn en la cara, el cuello o la mandbula. Resumen  Las causas del dolor dental pueden ser [place], entre ellas, [place] caries, una lesin o una infeccin. En algunos casos, se desconoce la causa.  El dolor puede ser leve o intenso. Puede sentir dolor todo el tiempo o solo cuando come o bebe.  Tome los medicamentos de venta libre y los recetados solamente como se lo haya indicado el mdico.  Scientist, water quality a fin de Public house manager cambio. Informe al mdico si los sntomas empeoran. Esta informacin no tiene Theme park manager el consejo del mdico. Asegrese de hacerle al mdico cualquier pregunta que tenga. Document Revised: 07/30/2017 Document Reviewed: 07/30/2017 Elsevier Patient Education  2020 [employer].

## 2020-06-06 NOTE — Progress Notes (Signed)
Subjective:  Patient ID: Andrew Sandoval, male    DOB: 1980/10/09  Age: 39 y.o. MRN: [id number]  CC: Hyperlipidemia   HPI Andrew Sandoval  is a 39 year old patient with a history of hyperlipidemia who presents today for chronic disease management. Compliant with his statin and last lipid panel was normal. He has no adverse effects from his medication.  He would like a referral to the Dentist for dental cleaning and filing of a molar tooth which hurts intermittently. Requests refill of a nasal spray  Past Medical History:  Diagnosis Date  . Cocaine use 2006-2010    Past Surgical History:  Procedure Laterality Date  . No prior surgery      Family History  Problem Relation Age of Onset  . Diabetes Neg Hx     Allergies  Allergen Reactions  . Tylenol [Acetaminophen] Hives    Outpatient Medications Prior to Visit  Medication Sig Dispense Refill  . naproxen (NAPROSYN) 500 MG tablet Take 1 tablet (500 mg total) by mouth 2 (two) times daily with a meal. 60 tablet 1  . atorvastatin (LIPITOR) 40 MG tablet Take 1 tablet (40 mg total) by mouth daily. 30 tablet 6   No facility-administered medications prior to visit.     ROS Review of Systems  Constitutional: Negative for activity change and appetite change.  HENT: Negative for sinus pressure and sore throat.   Eyes: Negative for visual disturbance.  Respiratory: Negative for cough, chest tightness and shortness of breath.   Cardiovascular: Negative for chest pain and leg swelling.  Gastrointestinal: Negative for abdominal distention, abdominal pain, constipation and diarrhea.  Endocrine: Negative.   Genitourinary: Negative for dysuria.  Musculoskeletal: Negative for joint swelling and myalgias.  Skin: Negative for rash.  Allergic/Immunologic: Negative.   Neurological: Negative for weakness, light-headedness and numbness.  Psychiatric/Behavioral: Negative for dysphoric mood and suicidal ideas.    Objective:  BP  120/74   Pulse 60   Ht 5\' 2"  (1.575 m)   Wt 165 lb 12.8 oz (75.2 kg)   SpO2 99%   BMI 30.33 kg/m   BP/Weight 06/06/2020 04/04/2020 02/29/2020  Systolic BP 120 116 -  Diastolic BP 74 71 -  Wt. (Lbs) 165.8 157 157  BMI 30.33 28.26 28.26      Physical Exam Constitutional:      Appearance: He is well-developed.  Neck:     Vascular: No JVD.  Cardiovascular:     Rate and Rhythm: Normal rate.     Heart sounds: Normal heart sounds. No murmur heard.   Pulmonary:     Effort: Pulmonary effort is normal.     Breath sounds: Normal breath sounds. No wheezing or rales.  Chest:     Chest wall: No tenderness.  Abdominal:     General: Bowel sounds are normal. There is no distension.     Palpations: Abdomen is soft. There is no mass.     Tenderness: There is no abdominal tenderness.  Musculoskeletal:        General: Normal range of motion.     Right lower leg: No edema.     Left lower leg: No edema.  Neurological:     Mental Status: He is alert and oriented to person, place, and time.  Psychiatric:        Mood and Affect: Mood normal.     CMP Latest Ref Rng & Units 12/08/2019 04/14/2019 08/19/2018  Glucose 65 - 99 mg/dL 95 95 95  BUN 6 - 24  mg/dL 12 14 14   Creatinine 0.76 - 1.27 mg/dL 2.83 1.51  Sodium 134 - 144 mmol/L 140 140 140  Potassium 3.5 - 5.2 mmol/L 4.1 4.5 4.2  Chloride 96 - 106 mmol/L 105 104 105  CO2 20 - 29 mmol/L 22 22 22   Calcium 8.7 - 10.2 mg/dL 9.3 9.4 9.4  Total Protein 6.0 - 8.5 g/dL 7.1 7.4 -  Total Bilirubin 0.0 - 1.2 mg/dL 0.7 0.6 -  Alkaline Phos 48 - 121 IU/L 125(H) 111 -  AST 0 - 40 IU/L 31 26 -  ALT 0 - 44 IU/L 62(H) 54(H) -    Lipid Panel     Component Value Date/Time   CHOL 125 02/02/2020 0907   TRIG 115 02/02/2020 0907   HDL 41 02/02/2020 0907   CHOLHDL 3.0 02/02/2020 0907   LDLCALC 63 02/02/2020 0907    CBC    Component Value Date/Time   WBC 4.5 06/06/2017 1157   WBC 6.6 05/30/2016 1531   RBC 5.33 06/06/2017 1157   RBC 5.21  05/30/2016 1531   HGB 16.3 06/06/2017 1157   HCT 48.5 06/06/2017 1157   PLT 303 06/06/2017 1157   MCV 91 06/06/2017 1157   MCH 30.6 06/06/2017 1157   MCH 31.7 05/30/2016 1531   MCHC 33.6 06/06/2017 1157   MCHC 34.4 05/30/2016 1531   RDW 13.6 06/06/2017 1157   LYMPHSABS 2.1 06/06/2017 1157   MONOABS 0.4 04/18/2009 1420   EOSABS 0.1 06/06/2017 1157   BASOSABS 0.0 06/06/2017 1157    Lab Results  Component Value Date   HGBA1C 5.4 09/25/2016    Assessment & Plan:  1. Pure hypercholesterolemia Controlled Low-cholesterol diet - atorvastatin (LIPITOR) 40 MG tablet; Take 1 tablet (40 mg total) by mouth daily.  Dispense: 30 tablet; Refill: 6  2. Dental cavity - Ambulatory referral to Dentistry  3. Other chronic sinusitis Placed on Flonase  4. Need for immunization against influenza - Flu Vaccine QUAD 36+ mos IM    Meds ordered this encounter  Medications  . atorvastatin (LIPITOR) 40 MG tablet    Sig: Take 1 tablet (40 mg total) by mouth daily.    Dispense:  30 tablet    Refill:  6  . fluticasone (FLONASE) 50 MCG/ACT nasal spray    Sig: Place 2 sprays into both nostrils daily.    Dispense:  16 g    Refill:  1    Follow-up: Return in about 6 months (around 12/05/2020) for Chronic disease management.       09/27/2016, MD, FAAFP. [facility] [place], [facility] [place]   06/06/2020, 11:00 AM

## 2020-07-04 ENCOUNTER — Other Ambulatory Visit: Payer: Self-pay

## 2020-07-04 ENCOUNTER — Encounter (HOSPITAL_COMMUNITY): Payer: Self-pay | Admitting: Emergency Medicine

## 2020-07-04 ENCOUNTER — Emergency Department (HOSPITAL_COMMUNITY)
Admission: EM | Admit: 2020-07-04 | Discharge: 2020-07-04 | Disposition: A | Discharge status: Home / Self Care | Payer: Self-pay | Attending: Emergency Medicine | Admitting: Emergency Medicine

## 2020-07-04 ENCOUNTER — Ambulatory Visit (HOSPITAL_COMMUNITY)
Admission: EM | Admit: 2020-07-04 | Discharge: 2020-07-04 | Disposition: A | Discharge status: Home / Self Care | Payer: Self-pay | Attending: Internal Medicine | Admitting: Internal Medicine

## 2020-07-04 ENCOUNTER — Encounter (HOSPITAL_COMMUNITY): Payer: Self-pay

## 2020-07-04 ENCOUNTER — Emergency Department (HOSPITAL_COMMUNITY): Payer: Self-pay

## 2020-07-04 DIAGNOSIS — R079 Chest pain, unspecified: Secondary | ICD-10-CM | POA: Insufficient documentation

## 2020-07-04 DIAGNOSIS — Z87891 Personal history of nicotine dependence: Secondary | ICD-10-CM | POA: Insufficient documentation

## 2020-07-04 LAB — BASIC METABOLIC PANEL
Anion gap: 11 (ref 5–15)
BUN: 11 mg/dL (ref 6–20)
CO2: 20 mmol/L — ABNORMAL LOW (ref 22–32)
Calcium: 8.9 mg/dL (ref 8.9–10.3)
Chloride: 107 mmol/L (ref 98–111)
Creatinine, Ser: 0.91 mg/dL (ref 0.61–1.24)
GFR, Estimated: >60 mL/min (ref >60)
Glucose, Bld: 97 mg/dL (ref 70–99)
Potassium: 3.7 mmol/L (ref 3.5–5.1)
Sodium: 138 mmol/L (ref 135–145)

## 2020-07-04 LAB — CBC
HCT: 50.3 % (ref 39.0–52.0)
Hemoglobin: 16.9 g/dL (ref 13.0–17.0)
MCH: 30.8 pg (ref 26.0–34.0)
MCHC: 33.6 g/dL (ref 30.0–36.0)
MCV: 91.8 fL (ref 80.0–100.0)
Platelets: 283 10*3/uL (ref 150–400)
RBC: 5.48 MIL/uL (ref 4.22–5.81)
RDW: 12.7 % (ref 11.5–15.5)
WBC: 5.2 10*3/uL (ref 4.0–10.5)
nRBC: 0 % (ref 0.0–0.2)

## 2020-07-04 LAB — TROPONIN I (HIGH SENSITIVITY)
Troponin I (High Sensitivity): 4 ng/L (ref <18)
Troponin I (High Sensitivity): 4 ng/L (ref <18)

## 2020-07-04 NOTE — ED Notes (Signed)
Patient verbalizes understanding of discharge instructions. Opportunity for questioning and answers were provided. Armband removed by staff, pt discharged from ED ambulatory.   

## 2020-07-04 NOTE — ED Notes (Signed)
Patient is being discharged from the Urgent Care and sent to the Emergency Department via POV. Per R. Lane, patient is in need of higher level of care due to chest pain. Patient is aware and verbalizes understanding of plan of care.  Vitals:   07/04/20 0823  BP: 112/77  Pulse: 66  Resp: 18  Temp: 98 F (36.7 C)  SpO2: 98%

## 2020-07-04 NOTE — ED Provider Notes (Signed)
[facility] EMERGENCY DEPARTMENT Provider Note   CSN: [id number] Arrival date & time: 07/04/20  0845     History No chief complaint on file.   Andrew Sandoval is a 40 y.o. male. Speaks Spanish only.  Utilized Electronics engineer throughout visit.  Patient reports episode of chest pain that occurred 3 days ago.  He had an episode of chest pain 2 days ago and yesterday.  All episodes were brief, lasting seconds, central, nonradiating.  Occurred at rest, not with exertion.  No association with deep breathing or coughing.  No shortness of breath.  Today he has not had any symptoms.  Denies prior history of heart disease or DVT/PE.   HPI     Past Medical History:  Diagnosis Date  . Cocaine use 2006-2010    Patient Active Problem List   Diagnosis Date Noted  . Bilateral carpal tunnel syndrome 02/02/2020  . Chronic pruritic rash in adult 10/21/2019  . Pain in both hands 08/18/2019  . Acute pain of right shoulder 08/18/2019  . Hyperlipidemia 06/09/2017  . Enlarged tonsils 11/11/2016  . Tinea pedis of right foot 11/11/2016  . Numbness 11/11/2016  . Positive H. pylori test 07/23/2016  . Chronic nasal congestion 01/18/2015    Past Surgical History:  Procedure Laterality Date  . No prior surgery         Family History  Problem Relation Age of Onset  . Diabetes Neg Hx     Social History   Tobacco Use  . Smoking status: Former Games developer  . Smokeless tobacco: Never Used  . Tobacco comment: quit 12 years ago  Vaping Use  . Vaping Use: Never used  Substance Use Topics  . Alcohol use: No    Alcohol/week: 0.0 standard drinks    Comment: quit 12 years ago  . Drug use: Yes    Types: Cocaine, Marijuana    Comment: Prior cocaine; none in 11 years    Home Medications Prior to Admission medications   Medication Sig Start Date End Date Taking? Authorizing Provider  atorvastatin (LIPITOR) 40 MG tablet Take 1 tablet (40 mg total) by mouth daily.  06/06/20   Hoy Register, MD  fluticasone (FLONASE) 50 MCG/ACT nasal spray Place 2 sprays into both nostrils daily. 06/06/20   Hoy Register, MD  naproxen (NAPROSYN) 500 MG tablet Take 1 tablet (500 mg total) by mouth 2 (two) times daily with a meal. 04/04/20   Claiborne Rigg, NP  gabapentin (NEURONTIN) 300 MG capsule Take 1 capsule (300 mg total) by mouth at bedtime. Patient not taking: Reported on 05/12/2019 02/17/19 07/07/19  Hoy Register, MD    Allergies    Tylenol [acetaminophen]  Review of Systems   Review of Systems  Constitutional: Negative for chills and fever.  HENT: Negative for ear pain and sore throat.   Eyes: Negative for pain and visual disturbance.  Respiratory: Positive for chest tightness. Negative for cough and shortness of breath.   Cardiovascular: Positive for chest pain. Negative for palpitations.  Gastrointestinal: Negative for abdominal pain and vomiting.  Genitourinary: Negative for dysuria and hematuria.  Musculoskeletal: Negative for arthralgias and back pain.  Skin: Negative for color change and rash.  Neurological: Negative for seizures and syncope.  All other systems reviewed and are negative.   Physical Exam Updated Vital Signs BP 118/78 (BP Location: Right Arm)   Pulse 60   Temp 98.4 F (36.9 C) (Oral)   Resp 15   SpO2 100%   Physical  Exam Vitals and nursing note reviewed.  Constitutional:      Appearance: He is well-developed and well-nourished.  HENT:     Head: Normocephalic and atraumatic.  Eyes:     Conjunctiva/sclera: Conjunctivae normal.  Cardiovascular:     Rate and Rhythm: Normal rate and regular rhythm.     Heart sounds: No murmur heard.   Pulmonary:     Effort: Pulmonary effort is normal. No respiratory distress.     Breath sounds: Normal breath sounds.  Abdominal:     Palpations: Abdomen is soft.     Tenderness: There is no abdominal tenderness.  Musculoskeletal:        General: No edema.     Cervical back: Neck  supple.  Skin:    General: Skin is warm and dry.     Capillary Refill: Capillary refill takes less than 2 seconds.  Neurological:     General: No focal deficit present.     Mental Status: He is alert.  Psychiatric:        Mood and Affect: Mood and affect and mood normal.        Behavior: Behavior normal.     ED Results / Procedures / Treatments   Labs (all labs ordered are listed, but only abnormal results are displayed) Labs Reviewed  BASIC METABOLIC PANEL - Abnormal; Notable for the following components:      Result Value   CO2 20 (*)    All other components within normal limits  CBC  TROPONIN I (HIGH SENSITIVITY)  TROPONIN I (HIGH SENSITIVITY)    EKG None  Radiology DG Chest 2 View  Result Date: 07/04/2020 CLINICAL DATA:  Chest pain and short of breath 3 days EXAM: CHEST - 2 VIEW COMPARISON:  04/18/2009 FINDINGS: The heart size and mediastinal contours are within normal limits. Both lungs are clear. The visualized skeletal structures are unremarkable. IMPRESSION: No active cardiopulmonary disease. Electronically Signed   By: Marlan Palau M.D.   On: 07/04/2020 09:27    Procedures Procedures   Medications Ordered in ED Medications - No data to display  ED Course  I have reviewed the triage vital signs and the nursing notes.  Pertinent labs & imaging results that were available during my care of the patient were reviewed by me and considered in my medical decision making (see chart for details).    MDM Rules/Calculators/A&P                         40 year old male with concern for episodes of chest pain.  On exam, patient is well-appearing and is currently asymptomatic.  His EKG does not have acute ischemic change and his troponin is within normal limits.  Very low suspicion for ACS.  CXR is negative.  Given reassuring work-up and no ongoing symptoms and normal vitals, believe patient can be safely discharged and managed in the outpatient setting.  Recommended  follow-up with primary doctor.  After the discussed management above, the patient was determined to be safe for discharge.  The patient was in agreement with this plan and all questions regarding their care were answered.  ED return precautions were discussed and the patient will return to the ED with any significant worsening of condition.   Final Clinical Impression(s) / ED Diagnoses Final diagnoses:  Chest pain, unspecified type    Rx / DC Orders ED Discharge Orders    None       Milagros Loll, MD 07/05/20  1553  

## 2020-07-04 NOTE — Discharge Instructions (Addendum)
Follow-up with your primary doctor regarding your episode of chest pain.  Return to ER if you have worsening pain, difficulty breathing or other new concerning symptom.

## 2020-07-04 NOTE — ED Triage Notes (Signed)
Spanish interpretor utilized to obtain history. Pt states she was sent here from urgent care because "something appeared in his heart." states he went there for ongoing CP x3 days. Denies sob.

## 2020-07-04 NOTE — ED Triage Notes (Signed)
Patient complains of new onset chest pain x 3 days with no improvement and numbness/pain radiating down his left arm. Pt states pain does not worsen with deep breaths or movement. Pt states pain is 5/10. Pt is aox4 and ambulatory.

## 2020-07-04 NOTE — ED Notes (Signed)
Writer used interpreter to communicate with pt. Pt states he doesn't have pain now and that it comes and goes. He didn't understand why all the test were being ran because it is not heart that hurts, it is left upper side.

## 2020-07-05 MED FILL — CLOTRIMAZOLE-BETAMETHASONE: 1-0.05 | 7 days supply | Qty: 30 | Fill #2

## 2020-07-18 ENCOUNTER — Other Ambulatory Visit: Payer: Self-pay | Admitting: Family Medicine

## 2020-07-19 MED FILL — HALOBETASOL PROP 0.05% CRM: 0.05 | 20 days supply | Qty: 50 | Fill #0

## 2020-07-26 ENCOUNTER — Other Ambulatory Visit: Payer: Self-pay | Admitting: Family Medicine

## 2020-07-26 ENCOUNTER — Ambulatory Visit (INDEPENDENT_AMBULATORY_CARE_PROVIDER_SITE_OTHER): Payer: Self-pay | Admitting: Family Medicine

## 2020-07-26 ENCOUNTER — Other Ambulatory Visit: Payer: Self-pay

## 2020-07-26 DIAGNOSIS — M25511 Pain in right shoulder: Secondary | ICD-10-CM

## 2020-07-26 DIAGNOSIS — M25512 Pain in left shoulder: Secondary | ICD-10-CM

## 2020-07-26 DIAGNOSIS — G8929 Other chronic pain: Secondary | ICD-10-CM

## 2020-07-26 MED ORDER — NAPROXEN 500 MG PO TABS: 500.0000 mg | ORAL_TABLET | Freq: Two times a day (BID) | ORAL | 3 refills | Status: DC

## 2020-07-26 MED FILL — NAPROXEN 500 MG TABLET: 500 | 30 days supply | Qty: 60 | Fill #0

## 2020-07-26 NOTE — Progress Notes (Signed)
I saw and examined the patient with Dr. Marga Hoots and agree with assessment and plan as outlined.    Recurrent left greater than right shoulder pain.  Suspect due to overuse at work.    Will treat with PT, naproxen, light duty work for a month.  MRI if fails to improve.  Also having bilateral CTS despite wearing braces.  Will make another visit to discuss that, possibly order nerve studies.

## 2020-07-26 NOTE — Progress Notes (Signed)
Office Visit Note   Patient: Andrew Sandoval           Date of Birth: 30-Sep-1980           MRN: [id number] Visit Date: 07/26/2020 Requested by: Hoy Register, MD [address] [place],  [place] [address] PCP: Hoy Register, MD  Subjective: Chief Complaint  Patient presents with  . Left Shoulder - Pain    Pain in left shoulder x 2 weeks. Pain radiates down the arm to the hand/fingers. NKI. Denies neck pain. Has also had this in the right arm, but it is more in the left. Hurts to raise that arm up above shoulder level. Has run out of naproxen 500 mg (bid). This does help.    HPI: 40yo M presenting to clinic with concerns of two weeks of left upper arm pain, extending down into his left forearm. 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 He says that when he uses this machine, it causes significant pain in both the back of his arm as well as his lateral forearm. He denies any trauma aside from this overuse. Says that the pain has started to cause difficulty with reaching his arm overhead as well. When he is not using the cheese machine, he feels as though his symptoms aren't too bad.  He has a history of bilateral carpal tunnel, which has worsened somewhat, but no need numbness in the affected side. He was previously taking Naprosyn, which helped, however he is now out of this medication. He is hoping for a note for work, as well as a Naprosyn refill to help his symptoms.   Video Interpretor: [id number]              ROS:   All other systems were reviewed and are negative.  Objective: Vital Signs: There were no vitals taken for this visit.  Physical Exam:  General:  Alert and oriented, in no acute distress. Pulm:  Breathing unlabored. Psy:  Normal mood, congruent affect. Skin:  Left arm with no bruising, rashes, or erythema. Overlying skin intact.    Left Arm/ Shoulder Exam:  Inspection: Symmetric muscle mass, no atrophy or deformity, no scars. Palpation: Endorses tenderness primarily along the medial triceps musculature, as well as at the triceps insertion point. Also with tenderness over the lateral epicondyle and within the extensor compartment of the forearm. No tenderness to palpation over and around the acromion, over the [facility] joint or biceps insertion.  Range of motion: Pain with shoulder abduction or flexion beyond 90*. Internal and external shoulder rotation symmetric with right arm. Full ROM at elbow.   Rotator cuff testing:  Full strength, though does endorse pain with empty can (supraspinatus).  External rotation with full strength, though endorses pain. Full strength and some pain with Napoleon.  Labral testing: O'Brien's/speeds with full strength though endorses pain. Crank test: Causes pain, though no obvious clunking  Impingement testing: Endorses pain with Juanetta Gosling  Endorses worsening elbow pain with resisted wrist extension.  Worsening arm pain with resisted triceps extension.   Strength testing:  5 out of 5 strength with shoulder abduction (C5), wrist extension (C6), wrist flexion (C7), grip strength (C8), and finger abduction (T1).  Sensation: Intact to light touch throughout bilateral upper extremities.   Sperling Negative.   Brisk distal capillary refill.  Imaging: No results found.  Assessment & Plan: 40yo M presenting to clinic with concerns of left arm pain after using a lever for prolonged time at work. Suspect overuse tendinopathy as a primary source of his symptoms, though cannot rule out radicular symptoms.  - Will provide a note for work to avoid offending activities for one month - Naprosyn refilled - given concern for tendinopathy, will place referral to physical therapy.   - Patient is agreeable with plan. He will reschedule for thorough evaluation of his recurrent carpal tunnel.       Procedures: No procedures performed        PMFS History: Patient Active Problem List   Diagnosis Date Noted  . Bilateral carpal tunnel syndrome 02/02/2020  . Chronic pruritic rash in adult 10/21/2019  . Pain in both hands 08/18/2019  . Acute pain of right shoulder 08/18/2019  . Hyperlipidemia 06/09/2017  . Enlarged tonsils 11/11/2016  . Tinea pedis of right foot 11/11/2016  . Numbness 11/11/2016  . Positive H. pylori test 07/23/2016  . Chronic nasal congestion 01/18/2015   Past Medical History:  Diagnosis Date  . Cocaine use 2006-2010    Family History  Problem Relation Age of Onset  . Diabetes Neg Hx     Past Surgical History:  Procedure Laterality Date  . No prior surgery     Social History   Occupational History    Comment: [employer]  Tobacco Use  . Smoking status: Former Games developer  . Smokeless tobacco: Never Used  . Tobacco comment: quit 12 years ago  Vaping Use  . Vaping Use: Never used  Substance and Sexual Activity  . Alcohol use: No    Alcohol/week: 0.0 standard drinks    Comment: quit 12 years ago  . Drug use: Yes    Types: Cocaine, Marijuana    Comment: Prior cocaine; none in 11 years  . Sexual activity: Yes

## 2020-08-01 ENCOUNTER — Other Ambulatory Visit: Payer: Self-pay

## 2020-08-01 ENCOUNTER — Encounter: Payer: Self-pay | Admitting: Family Medicine

## 2020-08-01 ENCOUNTER — Other Ambulatory Visit: Payer: Self-pay | Admitting: Family Medicine

## 2020-08-01 ENCOUNTER — Ambulatory Visit: Payer: Self-pay | Attending: Family Medicine | Admitting: Family Medicine

## 2020-08-01 VITALS — BP 118/75 | HR 54 | Ht 62.0 in | Wt 166.0 lb

## 2020-08-01 DIAGNOSIS — M79602 Pain in left arm: Secondary | ICD-10-CM

## 2020-08-01 DIAGNOSIS — M79601 Pain in right arm: Secondary | ICD-10-CM

## 2020-08-01 DIAGNOSIS — R202 Paresthesia of skin: Secondary | ICD-10-CM

## 2020-08-01 DIAGNOSIS — E7800 Pure hypercholesterolemia, unspecified: Secondary | ICD-10-CM

## 2020-08-01 MED ORDER — ATORVASTATIN CALCIUM 40 MG PO TABS: 40.0000 mg | ORAL_TABLET | Freq: Every day | ORAL | 6 refills | Status: DC

## 2020-08-01 MED ORDER — GABAPENTIN 300 MG PO CAPS: 300.0000 mg | ORAL_CAPSULE | Freq: Every day | ORAL | 1 refills | Status: DC

## 2020-08-01 MED FILL — GABAPENTIN 300 MG CAPSULE: 300 | 30 days supply | Qty: 30 | Fill #0

## 2020-08-01 MED FILL — ?ATORVASTATIN 40MG TABLET: 40 | 30 days supply | Qty: 30 | Fill #0

## 2020-08-01 MED FILL — FLUTICASONE PROP 50 MCG SPR: 50 | 30 days supply | Qty: 16 | Fill #1

## 2020-08-01 NOTE — Progress Notes (Signed)
Subjective:  Patient ID: Andrew Sandoval, male    DOB: [birth year]  Age: 40 y.o. MRN: [id number]  CC: Hospitalization Follow-up   HPI Andrew Sandoval is a 40 year old male with a history of hyperlipidemia who presents today for follow-up from the ED where he was seen for chest pain 3 weeks ago.  Work-up ruled out acute coronary syndrome.  Chest pain has resolved and he is concerned about bilateral upper extremity pain today. He has pain in his upper arms for the last 3 weeks; he points to his upper arm on forearms bilaterally. His level of activity has not changed. Pain occurs mostly at night when he is asleep and his fingers get numb with associated pain in arms.  Seen by orthopedics 2 days ago and prescribed naproxen for left shoulder pain which was described as radiating to the hand and fingers.  He denies dropping things from his hands or weakness in either of his upper extremities. Past Medical History:  Diagnosis Date  . Cocaine use 2006-2010    Past Surgical History:  Procedure Laterality Date  . No prior surgery      Family History  Problem Relation Age of Onset  . Diabetes Neg Hx     Allergies  Allergen Reactions  . Tylenol [Acetaminophen] Hives    Outpatient Medications Prior to Visit  Medication Sig Dispense Refill  . fluticasone (FLONASE) 50 MCG/ACT nasal spray Place 2 sprays into both nostrils daily. 16 g 1  . halobetasol (ULTRAVATE) 0.05 % cream APPLY TOPICALLY 2 (TWO) TIMES DAILY. 50 g 0  . naproxen (NAPROSYN) 500 MG tablet Take 1 tablet (500 mg total) by mouth 2 (two) times daily with a meal. 60 tablet 3  . atorvastatin (LIPITOR) 40 MG tablet Take 1 tablet (40 mg total) by mouth daily. 30 tablet 6   No facility-administered medications prior to visit.     ROS Review of Systems  Constitutional: Negative for activity change and appetite change.  HENT: Negative for sinus pressure and sore throat.   Eyes: Negative for visual disturbance.   Respiratory: Negative for cough, chest tightness and shortness of breath.   Cardiovascular: Negative for chest pain and leg swelling.  Gastrointestinal: Negative for abdominal distention, abdominal pain, constipation and diarrhea.  Endocrine: Negative.   Genitourinary: Negative for dysuria.  Musculoskeletal:       See HPI  Skin: Negative for rash.  Allergic/Immunologic: Negative.   Neurological: Negative for weakness, light-headedness and numbness.  Psychiatric/Behavioral: Negative for dysphoric mood and suicidal ideas.    Objective:  BP 118/75   Pulse (!) 54   Ht 5\' 2"  (1.575 m)   Wt 166 lb (75.3 kg)   SpO2 98%   BMI 30.36 kg/m   BP/Weight 08/01/2020 07/04/2020 07/04/2020  Systolic BP 118 118 112  Diastolic BP 75 78 77  Wt. (Lbs) 166 - -  BMI 30.36 - -      Physical Exam Constitutional:      Appearance: He is well-developed.  Neck:     Vascular: No JVD.  Cardiovascular:     Rate and Rhythm: Normal rate.     Heart sounds: Normal heart sounds. No murmur heard.   Pulmonary:     Effort: Pulmonary effort is normal.     Breath sounds: Normal breath sounds. No wheezing or rales.  Chest:     Chest wall: No tenderness.  Abdominal:     General: Bowel sounds are normal. There is no distension.  Palpations: Abdomen is soft. There is no mass.     Tenderness: There is no abdominal tenderness.  Musculoskeletal:        General: Tenderness (slight TTP of dorsal aspect of R middle finger MCP joint) present. Normal range of motion.     Right lower leg: No edema.     Left lower leg: No edema.     Comments: Able to make a fist bilaterally  Neurological:     Mental Status: He is alert and oriented to person, place, and time.  Psychiatric:        Mood and Affect: Mood normal.     CMP Latest Ref Rng & Units 07/04/2020 12/08/2019 04/14/2019  Glucose 70 - 99 mg/dL 97 95 95  BUN 6 - 20 mg/dL 11 12 14   Creatinine 0.61 - 1.24 mg/dL 9.02 4.09  Sodium 135 - 145 mmol/L 138 140  140  Potassium 3.5 - 5.1 mmol/L 3.7 4.1 4.5  Chloride 98 - 111 mmol/L 107 105 104  CO2 22 - 32 mmol/L 20(L) 22 22  Calcium 8.9 - 10.3 mg/dL 8.9 9.3 9.4  Total Protein 6.0 - 8.5 g/dL - 7.1 7.4  Total Bilirubin 0.0 - 1.2 mg/dL - 0.7 0.6  Alkaline Phos 48 - 121 IU/L - 125(H) 111  AST 0 - 40 IU/L - 31 26  ALT 0 - 44 IU/L - 62(H) 54(H)    Lipid Panel     Component Value Date/Time   CHOL 125 02/02/2020 0907   TRIG 115 02/02/2020 0907   HDL 41 02/02/2020 0907   CHOLHDL 3.0 02/02/2020 0907   LDLCALC 63 02/02/2020 0907    CBC    Component Value Date/Time   WBC 5.2 07/04/2020 0905   RBC 5.48 07/04/2020 0905   HGB 16.9 07/04/2020 0905   HGB 16.3 06/06/2017 1157   HCT 50.3 07/04/2020 0905   HCT 48.5 06/06/2017 1157   PLT 283 07/04/2020 0905   PLT 303 06/06/2017 1157   MCV 91.8 07/04/2020 0905   MCV 91 06/06/2017 1157   MCH 30.8 07/04/2020 0905   MCHC 33.6 07/04/2020 0905   RDW 12.7 07/04/2020 0905   RDW 13.6 06/06/2017 1157   LYMPHSABS 2.1 06/06/2017 1157   MONOABS 0.4 04/18/2009 1420   EOSABS 0.1 06/06/2017 1157   BASOSABS 0.0 06/06/2017 1157    Lab Results  Component Value Date   HGBA1C 5.4 09/25/2016    Assessment & Plan:  1. Pure hypercholesterolemia Controlled - atorvastatin (LIPITOR) 40 MG tablet; Take 1 tablet (40 mg total) by mouth daily.  Dispense: 30 tablet; Refill: 6  2. Pain in both upper extremities He is currently on naproxen  3. Paresthesia We will initiate gabapentin Advised of sedating side effects. - gabapentin (NEURONTIN) 300 MG capsule; Take 1 capsule (300 mg total) by mouth at bedtime.  Dispense: 30 capsule; Refill: 1    Meds ordered this encounter  Medications  . atorvastatin (LIPITOR) 40 MG tablet    Sig: Take 1 tablet (40 mg total) by mouth daily.    Dispense:  30 tablet    Refill:  6  . gabapentin (NEURONTIN) 300 MG capsule    Sig: Take 1 capsule (300 mg total) by mouth at bedtime.    Dispense:  30 capsule    Refill:  1     Follow-up: Return for medical condition keep previously scheduled appointment.       09/27/2016, MD, FAAFP. [facility] and [facility] [place], [place]  [phone]   08/01/2020, 11:03 AM

## 2020-08-01 NOTE — Progress Notes (Signed)
Not having chest pains anymore.  Having pain in his arm and hands.  Needs refill on Lipitor.

## 2020-08-01 NOTE — Patient Instructions (Signed)
Parestesia Paresthesia La parestesia es una sensacin de ardor o picazn. Esta sensacin puede aparecer en cualquier parte del cuerpo. Suele manifestarse en las manos, los brazos, las piernas o los pies. Por lo general, no es dolorosa. En la mayora de los casos, la sensacin desaparece al poco tiempo y no es un signo de un problema grave. Si tiene parestesia por mucho tiempo, es necesario que lo examine el mdico. Siga estas instrucciones en su casa: Consumo de alcohol  No beba alcohol si: ? El mdico le indica que no lo haga. ? Est embarazada, puede estar embarazada o est tratando de quedar embarazada.  Si bebe alcohol: ? Limite la cantidad que bebe:  De 0 a 1 medida por da para las mujeres.  De 0 a 2 medidas por da para los hombres. ? Est atento a la cantidad de alcohol que hay en las bebidas que toma. En los [place], una medida equivale a una botella de cerveza de 12oz (355ml), un vaso de vino de 5oz (148ml) o un vaso de una bebida alcohlica de alta graduacin de 1oz (44ml).   Nutricin  Siga una dieta saludable. Esto incluye: ? Consumir alimentos con alto contenido de fibra, como frutas y verduras frescas, cereales integrales y frijoles. ? Limitar el consumo de alimentos que contienen gran cantidad de grasas y azcares procesados, como los alimentos fritos o dulces.   Indicaciones generales  Use los medicamentos de venta libre y los recetados solamente como se lo haya indicado el mdico.  No consuma ningn producto que contenga nicotina o tabaco, como cigarrillos y cigarrillos electrnicos. Si necesita ayuda para dejar de consumir estos productos, consulte al mdico.  Si tiene diabetes, colabore con el mdico para asegurarse de mantener el nivel de azcar en sangre dentro de un rango saludable.  Si siente adormecimiento en los pies: ? Controle si tiene enrojecimiento, calor e hinchazn todos los das. ? Use calcetines acolchados y zapatos cmodos. Estos ayudan  a proteger los pies.  Concurra a todas visitas de seguimiento como se lo haya indicado el mdico. Esto es importante. Comunquese con un mdico si:  Sus sntomas de parestesia empeoran o no desaparecen.  Pierde la sensibilidad (tiene adormecimiento) despus de una lesin.  La sensacin de ardor o picazn empeora al caminar.  Siente dolor o tiene calambres.  Siente mareos o se desmaya.  Tiene una erupcin cutnea. Solicite ayuda de inmediato si:  Se siente dbil o comienza a tener debilidad en un brazo o una pierna.  Tiene dificultad para caminar o moverse.  Tiene problemas para hablar, comprender o ver.  Se siente confundido.  No puede controlar la orina (miccin) ni las evacuaciones intestinales (deposiciones). Resumen  La parestesia es una sensacin de ardor o picazn. Suele manifestarse en las manos, los brazos, las piernas o los pies.  En la mayora de los casos, la sensacin desaparece al poco tiempo y no es un signo de un problema grave.  Si tiene parestesia por mucho tiempo, es necesario que lo examine el mdico. Esta informacin no tiene como fin reemplazar el consejo del mdico. Asegrese de hacerle al mdico cualquier pregunta que tenga. Document Revised: 03/17/2020 Document Reviewed: 03/17/2020 Elsevier Patient Education  2021 Elsevier Inc.  

## 2020-08-16 ENCOUNTER — Ambulatory Visit (INDEPENDENT_AMBULATORY_CARE_PROVIDER_SITE_OTHER): Payer: Self-pay | Admitting: Rehabilitative and Restorative Service Providers"

## 2020-08-16 ENCOUNTER — Other Ambulatory Visit: Payer: Self-pay

## 2020-08-16 ENCOUNTER — Encounter: Payer: Self-pay | Admitting: Rehabilitative and Restorative Service Providers"

## 2020-08-16 DIAGNOSIS — R293 Abnormal posture: Secondary | ICD-10-CM

## 2020-08-16 DIAGNOSIS — M25511 Pain in right shoulder: Secondary | ICD-10-CM

## 2020-08-16 DIAGNOSIS — M6281 Muscle weakness (generalized): Secondary | ICD-10-CM

## 2020-08-16 DIAGNOSIS — M25512 Pain in left shoulder: Secondary | ICD-10-CM

## 2020-08-16 NOTE — Patient Instructions (Signed)
Access Code: [facility] URL: [email or web address] Date: 08/16/2020 Prepared by: Chyrel Masson  Exercises Seated Scapular Retraction - 2 x daily - 7 x weekly - 2 sets - 10 reps - 5 hold Sidelying Shoulder External Rotation - 2 x daily - 7 x weekly - 3 sets - 10 reps Supine Shoulder Flexion Extension AAROM with Dowel - 2 x daily - 7 x weekly - 1-2 sets - 10 reps - 2 hold Standing Isometric Shoulder Extension at Table - 2 x daily - 7 x weekly - 1 sets - 15 reps - 5 hold

## 2020-08-16 NOTE — Therapy (Signed)
[facility] [address] [place], [place], [address] Phone: [phone]   Fax:  [phone]  Physical Therapy Evaluation  Patient Details  Name: Andrew Sandoval MRN: [id number] Date of Birth: 04/08/1981 Referring Provider (PT): Dr. Prince RomeHilts   Encounter Date: 08/16/2020   PT End of Session - 08/16/20 0958    Visit Number 1    Number of Visits 12    Date for PT Re-Evaluation 10/11/20    Progress Note Due on Visit 10    PT Start Time 1020    PT Stop Time 1055    PT Time Calculation (min) 35 min    Activity Tolerance Patient tolerated treatment well    Behavior During Therapy [facility] for tasks assessed/performed           Past Medical History:  Diagnosis Date  . Cocaine use 2006-2010    Past Surgical History:  Procedure Laterality Date  . No prior surgery      There were no vitals filed for this visit.    Subjective Assessment - 08/16/20 1014    Subjective Bilateral shoulder pain complaints approx 6 months ago insidious onset.  Pt. indicated Lt shoulder seems worse than Rt shoulder.   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  Pt. also had complaints of hand numbness.    Patient is accompained by: Interpreter    Pertinent History History of bilateral carpal tunnel syndrome.    Limitations House hold activities;Lifting;Other (comment)   work   Patient Stated Goals Reduce pain, use arm better    Currently in Pain? Yes    Pain Score 8     Pain Location Shoulder    Pain Orientation Left;Right   Lt > Rt   Pain Descriptors / Indicators Tightness;Aching    Pain Type Chronic pain    Pain Onset More than a month ago    Pain Frequency Intermittent    Aggravating Factors  slicer use at work, lifting/carrying, moving sideways    Pain Relieving Factors Avoid painful movement/rest    Effect of Pain on Daily Activities Work difficulty/limitations.              [facility] - 08/16/20 0001       Assessment   Medical Diagnosis Bilateral shoulder pain    Referring Provider (PT) Dr. Prince RomeHilts    Onset Date/Surgical Date 07/11/20   approx.   Hand Dominance Right      Precautions   Precautions None      Restrictions   Weight Bearing Restrictions No      Balance Screen   Has the patient fallen in the past 6 months No    Has the patient had a decrease in activity level because of a fear of falling?  No    Is the patient reluctant to leave their home because of a fear of falling?  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      Cognition   Overall Cognitive Status Within Functional Limits for tasks assessed      Observation/Other Assessments   Focus on Therapeutic Outcomes (FOTO)  intake 54%, outcome expected 73%  Posture/Postural Control   Posture Comments bilateral rounded shoulder, FHP, increased thoracic kyphosis, protraction, anterior tilt scapula on Lt > Rt      ROM / Strength   AROM / PROM / Strength PROM;Strength;AROM      AROM   Overall AROM Comments Rt HBB: T11 discomfort   Lt HBB: t10 c pain Lt shoulder.  Forward elevation against gravity 100 deg on Lt UE c pain, Rt 130 deg c mild pain    AROM Assessment Site Shoulder;Cervical    Right/Left Shoulder Left;Right    Right Shoulder Flexion 135 Degrees   supine c pain   Right Shoulder ABduction 110 Degrees   supine with pain   Left Shoulder Flexion 135 Degrees   supine with pain   Left Shoulder ABduction 90 Degrees   in supine c pain   Left Shoulder Internal Rotation 80 Degrees   in supine 45 deg abd   Left Shoulder External Rotation 60 Degrees   in supine 45 deg abd   Cervical Flexion --   no change in UE symptoms   Cervical Extension --   no change in UE symptoms   Cervical  - Right Rotation no change in UE symptoms    Cervical - Left Rotation --   no change in UE symptoms     PROM   Overall PROM Comments Similar complaints at end range over pressure in Lt GH PROM assessment (pain limited)    PROM Assessment Site Shoulder    Right/Left Shoulder Left;Right      Strength   Overall Strength Comments Pain noted c all MMT each UE, in general Lt > Rt in pain level    Strength Assessment Site Shoulder;Elbow    Right/Left Shoulder Left;Right    Right Shoulder Flexion 4+/5    Right Shoulder ABduction 4/5    Right Shoulder Internal Rotation 4+/5    Right Shoulder External Rotation 4+/5    Left Shoulder Flexion 4/5    Left Shoulder ABduction 3+/5    Left Shoulder Internal Rotation 5/5    Left Shoulder External Rotation 4/5    Right/Left Elbow Left;Right    Right Elbow Flexion 5/5    Right Elbow Extension 5/5    Left Elbow Flexion 5/5    Left Elbow Extension 5/5      Palpation   Palpation comment TrP noted in bilateral infraspinatus, supraspinatus, anterior deltoid, upper trap, generally Lt> Rt      Special Tests   Other special tests (-) Painful arc, (-) Drop arm, (-) Apprehension, + hawkins kennedy, Neers bilateral                      Objective measurements completed on examination: See above findings.       [facility] Adult PT Treatment/Exercise - 08/16/20 0001      Exercises   Exercises Other Exercises;Shoulder    Other Exercises  HEP instruction/performance c verbal, visual and tactile cues.  Additional time spent in review for technique improvement.  Trial set of each performed per handout provided.  HEP consisting of supine wand flexion, sidelying gh er bilateral, scap retraction sitting, lower trap isometric ext into table 5 sec hold      Manual Therapy   Manual therapy comments Lt shoulder prom, g2-g3 inferior jt mobs for pain relief                  PT Education - 08/16/20 1104    Education Details HEP, POC  Person(s)  Educated Patient    Methods Explanation;Demonstration;Verbal cues;Handout    Comprehension Returned demonstration;Verbalized understanding            PT Short Term Goals - 08/16/20 0958      PT SHORT TERM GOAL #1   Title Patient will demonstrate independent use of home exercise program to maintain progress from in clinic treatments.    Time 3    Period Weeks    Status New    Target Date 09/06/20             PT Long Term Goals - 08/16/20 1000      PT LONG TERM GOAL #1   Title Patient will demonstrate/report pain at worst less than or equal to 2/10 to facilitate minimal limitation in daily activity secondary to pain symptoms.    Time 8    Period Weeks    Status New    Target Date 10/11/20      PT LONG TERM GOAL #2   Title Patient will demonstrate independent use of home exercise program to facilitate ability to maintain/progress functional gains from skilled physical therapy services.    Time 8    Period Weeks    Status New    Target Date 10/11/20      PT LONG TERM GOAL #3   Title Patient will demonstrate bilateral GH joint mobility WFL to facilitate usual self care, dressing, reaching overhead at PLOF s limitation due to symptoms.    Time 8    Period Weeks    Status New    Target Date 10/11/20      PT LONG TERM GOAL #4   Title Patient will demonstrate bilateral UE MMT 5/5 throughout to facilitate usual lifting, carrying in functional activity to PLOF s limitation.    Time 8    Period Weeks    Status New    Target Date 10/11/20      PT LONG TERM GOAL #5   Title Pt. will demonstrate FOTO outcome > or = 73    Time 8    Period Weeks    Status New    Target Date 10/11/20                  Plan - 08/16/20 1001    Clinical Impression Statement Patient is a 40 y.o. male who comes to clinic with complaints of bilateral shoulder pain with mobility, strength and movement coordination deficits that impair their ability to perform usual daily and recreational  functional activities without increase difficulty/symptoms at this time.  Pt. also has complaints of bilateral hand numbness/carpal tunnel like symptoms that were not specific to physical therapy referral.  Pt is undercare of a physician different than referring physician for those complaints. Patient to benefit from skilled PT services to address impairments and limitations to improve to previous level of function without restriction secondary to condition.    Personal Factors and Comorbidities Other   history of bilateral CTS   Examination-Activity Limitations Sleep;Carry;Dressing;Lift;Reach Overhead    Examination-Participation Restrictions Occupation;Meal Prep;Community Activity    Stability/Clinical Decision Making Stable/Uncomplicated    Clinical Decision Making Low    Rehab Potential Good    PT Frequency 2x / week    PT Duration 8 weeks    PT Treatment/Interventions ADLs/Self Care Home Management;Electrical Stimulation;Cryotherapy;Iontophoresis 4mg /ml Dexamethasone;Moist Heat;Traction;Balance training;Therapeutic exercise;Therapeutic activities;Functional mobility training;Stair training;Gait training;DME Instruction;Ultrasound;Neuromuscular re-education;Passive range of motion;Spinal Manipulations;Joint Manipulations;Dry needling;Patient/family education;Taping;Manual techniques    PT Next Visit Plan Review existing HEP  for comprehension.  Lower trap, middle trap strengthening for scapular movement control, er/flexion/abduction strengthening    PT Home Exercise Plan [email or web address]    Consulted and Agree with Plan of Care Patient           Patient will benefit from skilled therapeutic intervention in order to improve the following deficits and impairments:  Decreased endurance,Pain,Impaired UE functional use,Increased fascial restricitons,Decreased strength,Decreased activity tolerance,Decreased mobility,Decreased range of motion,Improper body mechanics,Impaired perceived functional  ability,Postural dysfunction,Impaired flexibility,Decreased coordination  Visit Diagnosis: Acute pain of right shoulder  Acute pain of left shoulder  Muscle weakness (generalized)  Abnormal posture     Problem List Patient Active Problem List   Diagnosis Date Noted  . Bilateral carpal tunnel syndrome 02/02/2020  . Chronic pruritic rash in adult 10/21/2019  . Pain in both hands 08/18/2019  . Acute pain of right shoulder 08/18/2019  . Hyperlipidemia 06/09/2017  . Enlarged tonsils 11/11/2016  . Tinea pedis of right foot 11/11/2016  . Numbness 11/11/2016  . Positive H. pylori test 07/23/2016  . Chronic nasal congestion 01/18/2015    Chyrel Masson, PT, DPT, OCS, ATC 08/16/20  11:27 AM    [facility] [address] [place], [place], [address] Phone: [phone]   Fax:  [phone]  Name: Dee Maday MRN: [id number] Date of Birth: Jul 18, 1980

## 2020-08-21 ENCOUNTER — Other Ambulatory Visit: Payer: Self-pay

## 2020-08-21 ENCOUNTER — Encounter: Payer: Self-pay | Admitting: Physical Therapy

## 2020-08-21 ENCOUNTER — Ambulatory Visit (INDEPENDENT_AMBULATORY_CARE_PROVIDER_SITE_OTHER): Payer: Self-pay | Admitting: Physical Therapy

## 2020-08-21 DIAGNOSIS — R293 Abnormal posture: Secondary | ICD-10-CM

## 2020-08-21 DIAGNOSIS — M6281 Muscle weakness (generalized): Secondary | ICD-10-CM

## 2020-08-21 DIAGNOSIS — M25511 Pain in right shoulder: Secondary | ICD-10-CM

## 2020-08-21 DIAGNOSIS — M25512 Pain in left shoulder: Secondary | ICD-10-CM

## 2020-08-21 NOTE — Therapy (Signed)
[facility] [address] [place], [place], [address] Phone: [phone]   Fax:  [phone]  Physical Therapy Treatment  Patient Details  Name: Andrew Sandoval MRN: [id number] Date of Birth: 03/30/1981 Referring Provider (PT): Dr. Prince Rome   Encounter Date: 08/21/2020   PT End of Session - 08/21/20 0927    Visit Number 2    Number of Visits 12    Date for PT Re-Evaluation 10/11/20    Progress Note Due on Visit 10    PT Start Time 0849    PT Stop Time 0929    PT Time Calculation (min) 40 min    Activity Tolerance Patient tolerated treatment well    Behavior During Therapy [facility] for tasks assessed/performed           Past Medical History:  Diagnosis Date  . Cocaine use 2006-2010    Past Surgical History:  Procedure Laterality Date  . No prior surgery      There were no vitals filed for this visit.   Subjective Assessment - 08/21/20 0927    Subjective Pt arriving to therapy reporting doing his HEP and not being able to tell a differency. Pt reporting 8/10 bilateral shoulder pain.    Patient is accompained by: Interpreter    Pertinent History History of bilateral carpal tunnel syndrome.    Limitations House hold activities;Lifting;Other (comment)    Patient Stated Goals Reduce pain, use arm better    Currently in Pain? Yes    Pain Score 8     Pain Location Shoulder    Pain Orientation Right;Left    Pain Descriptors / Indicators Aching;Tightness    Pain Type Chronic pain    Pain Onset More than a month ago                             [facility] Adult PT Treatment/Exercise - 08/21/20 0001      Posture/Postural Control   Posture Comments bilateral rounded shoulder, FHP, increased thoracic kyphosis, protraction, anterior tilt scapula on Lt > Rt      Shoulder Exercises: Supine   External Rotation AAROM;Both;15 reps    Flexion AAROM;15 reps    Other Supine Exercises bilaeral ER using yellow theraband (level 1)     Other Supine Exercises serratus punches x 15 using 2# bar      Shoulder Exercises: Seated   Other Seated Exercises seated scapular retraction x 10 hoding 5 seconds each      Shoulder Exercises: Stretch   Wall Stretch - Flexion 3 reps;10 seconds    Table Stretch - External Rotation 5 reps;10 seconds      Manual Therapy   Manual therapy comments Lt shoulder prom, g2-g3 inferior jt mobs for pain relief                    PT Short Term Goals - 08/21/20 0850      PT SHORT TERM GOAL #1   Title Patient will demonstrate independent use of home exercise program to maintain progress from in clinic treatments.    Status On-going             PT Long Term Goals - 08/21/20 0850      PT LONG TERM GOAL #1   Title Patient will demonstrate/report pain at worst less than or equal to 2/10 to facilitate minimal limitation in daily activity secondary to pain symptoms.    Status On-going  PT LONG TERM GOAL #2   Title Patient will demonstrate independent use of home exercise program to facilitate ability to maintain/progress functional gains from skilled physical therapy services.    Status On-going      PT LONG TERM GOAL #3   Title Patient will demonstrate bilateral GH joint mobility WFL to facilitate usual self care, dressing, reaching overhead at PLOF s limitation due to symptoms.    Status On-going      PT LONG TERM GOAL #4   Title Patient will demonstrate bilateral UE MMT 5/5 throughout to facilitate usual lifting, carrying in functional activity to PLOF s limitation.    Status On-going      PT LONG TERM GOAL #5   Title Pt. will demonstrate FOTO outcome > or = 73    Status On-going                 Plan - 08/21/20 0920    Clinical Impression Statement Pt arriving today reporting 8/10 pain in bilateral shoulders. Pt with mild limitations in exercises today due to pain. Pt was educated on posture during work activities, HEP reviewed and the use of ice vs heat for pain  relief. Pt still reporting cramping in fingers. Continue skilled PT.    Personal Factors and Comorbidities Other    Examination-Activity Limitations Sleep;Carry;Dressing;Lift;Reach Overhead    Examination-Participation Restrictions Occupation;Meal Prep;Community Activity    Stability/Clinical Decision Making Stable/Uncomplicated    Rehab Potential Good    PT Frequency 2x / week    PT Duration 8 weeks    PT Treatment/Interventions ADLs/Self Care Home Management;Electrical Stimulation;Cryotherapy;Iontophoresis 4mg /ml Dexamethasone;Moist Heat;Traction;Balance training;Therapeutic exercise;Therapeutic activities;Functional mobility training;Stair training;Gait training;DME Instruction;Ultrasound;Neuromuscular re-education;Passive range of motion;Spinal Manipulations;Joint Manipulations;Dry needling;Patient/family education;Taping;Manual techniques    PT Next Visit Plan Lower trap, middle trap strengthening for scapular movement control, er/flexion/abduction strengthening    PT Home Exercise Plan [email or web address]    Consulted and Agree with Plan of Care Patient           Patient will benefit from skilled therapeutic intervention in order to improve the following deficits and impairments:  Decreased endurance,Pain,Impaired UE functional use,Increased fascial restricitons,Decreased strength,Decreased activity tolerance,Decreased mobility,Decreased range of motion,Improper body mechanics,Impaired perceived functional ability,Postural dysfunction,Impaired flexibility,Decreased coordination  Visit Diagnosis: Acute pain of right shoulder  Acute pain of left shoulder  Muscle weakness (generalized)  Abnormal posture     Problem List Patient Active Problem List   Diagnosis Date Noted  . Bilateral carpal tunnel syndrome 02/02/2020  . Chronic pruritic rash in adult 10/21/2019  . Pain in both hands 08/18/2019  . Acute pain of right shoulder 08/18/2019  . Hyperlipidemia 06/09/2017  . Enlarged tonsils  11/11/2016  . Tinea pedis of right foot 11/11/2016  . Numbness 11/11/2016  . Positive H. pylori test 07/23/2016  . Chronic nasal congestion 01/18/2015    03/20/2015, PT MPT 08/21/2020, 9:35 AM  [facility] [address] [place], [place], [place] Phone: [phone]   Fax:  [phone]  Name: Andrew Sandoval MRN: Cletis Media Date of Birth: May 20, 1981

## 2020-08-30 ENCOUNTER — Encounter: Payer: Self-pay | Admitting: Physical Therapy

## 2020-08-30 ENCOUNTER — Other Ambulatory Visit: Payer: Self-pay

## 2020-08-30 ENCOUNTER — Ambulatory Visit (INDEPENDENT_AMBULATORY_CARE_PROVIDER_SITE_OTHER): Payer: Self-pay | Admitting: Physical Therapy

## 2020-08-30 DIAGNOSIS — R293 Abnormal posture: Secondary | ICD-10-CM

## 2020-08-30 DIAGNOSIS — M25511 Pain in right shoulder: Secondary | ICD-10-CM

## 2020-08-30 DIAGNOSIS — M25512 Pain in left shoulder: Secondary | ICD-10-CM

## 2020-08-30 DIAGNOSIS — M6281 Muscle weakness (generalized): Secondary | ICD-10-CM

## 2020-08-30 NOTE — Therapy (Signed)
[facility] [address] [place], [place], [address] Phone: [phone]   Fax:  [phone]  Physical Therapy Treatment  Patient Details  Name: Andrew Sandoval MRN: [id number] Date of Birth: [birth year] Referring Provider (PT): Dr. Prince Rome   Encounter Date: 08/30/2020   PT End of Session - 08/30/20 1013    Visit Number 3    Number of Visits 12    Progress Note Due on Visit 10    PT Start Time 1013    PT Stop Time 1102    PT Time Calculation (min) 49 min    Activity Tolerance Patient tolerated treatment well    Behavior During Therapy [facility] for tasks assessed/performed           Past Medical History:  Diagnosis Date  . Cocaine use 2006-2010    Past Surgical History:  Procedure Laterality Date  . No prior surgery      There were no vitals filed for this visit.   Subjective Assessment - 08/30/20 1016    Subjective Pt reports he doesn't feel any difference since starting PT. He is doing bilat ER supine with a green band and he feels heat in the shoulder    Patient is accompained by: Interpreter    Patient Stated Goals Reduce pain, use arm better    Currently in Pain? Yes    Pain Score 6     Pain Location Shoulder    Pain Orientation Left;Right   Lt > Rt   Pain Descriptors / Indicators Aching    Pain Type Chronic pain    Pain Onset More than a month ago    Pain Frequency Constant                             [facility] Adult PT Treatment/Exercise - 08/30/20 0001      Shoulder Exercises: Supine   Horizontal ABduction Strengthening;Both;10 reps;Theraband    Theraband Level (Shoulder Horizontal ABduction) Level 2 (Red)    External Rotation Strengthening;Both;10 reps;Theraband    Theraband Level (Shoulder External Rotation) Level 2 (Red)    Flexion Strengthening;Both;10 reps;Theraband    Theraband Level (Shoulder Flexion) Level 2 (Red)    Flexion Limitations overhead pull      Shoulder Exercises: Stretch   Other  Shoulder Stretches upper trap stretch      Modalities   Modalities Moist Heat      Moist Heat Therapy   Number Minutes Moist Heat 10 Minutes    Moist Heat Location Cervical      Manual Therapy   Manual Therapy Soft tissue mobilization;Taping    Manual therapy comments skilled palpation and monitoring of soft tissue during DN    Soft tissue mobilization STM to Lt upper trap/levator    Kinesiotex Inhibit Muscle      Kinesiotix   Inhibit Muscle  I strip on Lt upper trap and then another I strip across to decompress.            Trigger Point Dry Needling - 08/30/20 0001    Consent Given? Yes    Education Handout Provided No   explained via interpreter   Muscles Treated Head and Neck Upper trapezius   Lt   Upper Trapezius Response Palpable increased muscle length;Twitch reponse elicited                  PT Short Term Goals - 08/21/20 0850      PT SHORT TERM  GOAL #1   Title Patient will demonstrate independent use of home exercise program to maintain progress from in clinic treatments.    Status On-going             PT Long Term Goals - 08/21/20 0850      PT LONG TERM GOAL #1   Title Patient will demonstrate/report pain at worst less than or equal to 2/10 to facilitate minimal limitation in daily activity secondary to pain symptoms.    Status On-going      PT LONG TERM GOAL #2   Title Patient will demonstrate independent use of home exercise program to facilitate ability to maintain/progress functional gains from skilled physical therapy services.    Status On-going      PT LONG TERM GOAL #3   Title Patient will demonstrate bilateral GH joint mobility WFL to facilitate usual self care, dressing, reaching overhead at PLOF s limitation due to symptoms.    Status On-going      PT LONG TERM GOAL #4   Title Patient will demonstrate bilateral UE MMT 5/5 throughout to facilitate usual lifting, carrying in functional activity to PLOF s limitation.    Status On-going       PT LONG TERM GOAL #5   Title Pt. will demonstrate FOTO outcome > or = 73    Status On-going                 Plan - 08/30/20 1019    Clinical Impression Statement Blu reported not much change since starting therapy.  He is still working and having to use bilat UE's a lot.  Pt agreed through interpreter to have trigger point dry needling.  This was performed to the Lt upper trap and he had good released. He reported feeling better when he left the clinic.  HEP was modified as he was already doing some other exericse at home. He may need more DN to relieve tightness and pain.    Rehab Potential Good    PT Frequency 2x / week    PT Duration 8 weeks    PT Treatment/Interventions ADLs/Self Care Home Management;Electrical Stimulation;Cryotherapy;Iontophoresis 4mg /ml Dexamethasone;Moist Heat;Traction;Balance training;Therapeutic exercise;Therapeutic activities;Functional mobility training;Stair training;Gait training;DME Instruction;Ultrasound;Neuromuscular re-education;Passive range of motion;Spinal Manipulations;Joint Manipulations;Dry needling;Patient/family education;Taping;Manual techniques    PT Next Visit Plan assess response to DN and new HEP    PT Home Exercise Plan [email or web address]    Consulted and Agree with Plan of Care Patient           Patient will benefit from skilled therapeutic intervention in order to improve the following deficits and impairments:  Decreased endurance,Pain,Impaired UE functional use,Increased fascial restricitons,Decreased strength,Decreased activity tolerance,Decreased mobility,Decreased range of motion,Improper body mechanics,Postural dysfunction,Impaired flexibility,Decreased coordination  Visit Diagnosis: Acute pain of right shoulder  Acute pain of left shoulder  Muscle weakness (generalized)  Abnormal posture     Problem List Patient Active Problem List   Diagnosis Date Noted  . Bilateral carpal tunnel syndrome 02/02/2020  . Chronic  pruritic rash in adult 10/21/2019  . Pain in both hands 08/18/2019  . Acute pain of right shoulder 08/18/2019  . Hyperlipidemia 06/09/2017  . Enlarged tonsils 11/11/2016  . Tinea pedis of right foot 11/11/2016  . Numbness 11/11/2016  . Positive H. pylori test 07/23/2016  . Chronic nasal congestion 01/18/2015    03/20/2015 PT  08/30/2020, 12:42 PM  [facility] [address] [place], [place], [place] Phone: [phone]   Fax:  [phone]  Name: Epifanio  Jamaal Bernasconi MRN: [id number] Date of Birth: 1981/02/29

## 2020-09-04 ENCOUNTER — Encounter: Payer: Self-pay | Admitting: Physical Therapy

## 2020-09-04 ENCOUNTER — Ambulatory Visit (INDEPENDENT_AMBULATORY_CARE_PROVIDER_SITE_OTHER): Payer: Self-pay | Admitting: Physical Therapy

## 2020-09-04 ENCOUNTER — Other Ambulatory Visit: Payer: Self-pay

## 2020-09-04 DIAGNOSIS — M6281 Muscle weakness (generalized): Secondary | ICD-10-CM

## 2020-09-04 DIAGNOSIS — M25511 Pain in right shoulder: Secondary | ICD-10-CM

## 2020-09-04 DIAGNOSIS — R293 Abnormal posture: Secondary | ICD-10-CM

## 2020-09-04 DIAGNOSIS — M25512 Pain in left shoulder: Secondary | ICD-10-CM

## 2020-09-04 NOTE — Therapy (Signed)
[facility] [address] [place], [place], [address] Phone: [phone]   Fax:  [phone]  Physical Therapy Treatment  Patient Details  Name: Andrew Sandoval MRN: [id number] Date of Birth: 28-Dec-1980 Referring Provider (PT): Dr. Prince Rome   Encounter Date: 09/04/2020   PT End of Session - 09/04/20 0814    Visit Number 4    Number of Visits 12    Date for PT Re-Evaluation 10/11/20    Progress Note Due on Visit 10    PT Start Time 0802    PT Stop Time 0844    PT Time Calculation (min) 42 min    Activity Tolerance Patient tolerated treatment well    Behavior During Therapy [facility] for tasks assessed/performed           Past Medical History:  Diagnosis Date  . Cocaine use 2006-2010    Past Surgical History:  Procedure Laterality Date  . No prior surgery      There were no vitals filed for this visit.   Subjective Assessment - 09/04/20 0810    Subjective Pt reporting good response to Dry Needling at last visit. Pt wishing to try needling again at his appointment on Wednesday. Pt reporting no pain in his left shoulder today. 5-6/10 pain in his left shoulder.    Pertinent History History of bilateral carpal tunnel syndrome.    Limitations House hold activities;Lifting;Other (comment)    Patient Stated Goals Reduce pain, use arm better    Currently in Pain? Yes    Pain Score 6     Pain Location Shoulder    Pain Orientation Right    Pain Descriptors / Indicators Sore    Pain Type Chronic pain    Pain Onset More than a month ago                             [facility] Adult PT Treatment/Exercise - 09/04/20 0001      Shoulder Exercises: Supine   External Rotation Strengthening;15 reps;Right;Weights    External Rotation Weight (lbs) 2    Flexion Strengthening;Both;10 reps;Theraband    Shoulder Flexion Weight (lbs) 2      Shoulder Exercises: Sidelying   External Rotation Strengthening;Both;15 reps;Weights    External  Rotation Weight (lbs) 2    Flexion Strengthening;Right;Theraband;10 reps    Theraband Level (Shoulder Flexion) Level 2 (Red)      Shoulder Exercises: Standing   External Rotation 15 reps;Theraband    Theraband Level (Shoulder External Rotation) Level 2 (Red)    Extension Strengthening;Both;15 reps;Theraband    Theraband Level (Shoulder Extension) Level 2 (Red)    Row Strengthening;Both;15 reps;Theraband    Theraband Level (Shoulder Row) Level 3 (Green)      Shoulder Exercises: Stretch   Other Shoulder Stretches upper trap stretch      Manual Therapy   Manual Therapy Soft tissue mobilization;Joint mobilization    Manual therapy comments 15 minutes    Joint Mobilization grade 2-3 GH mobs    Soft tissue mobilization STM and active trigger point release to right upper trap    Kinesiotex Inhibit Muscle      Kinesiotix   Inhibit Muscle  1 strip across Right upper trap and 1 for decompression                    PT Short Term Goals - 09/04/20 1027      PT SHORT TERM GOAL #1  Title Patient will demonstrate independent use of home exercise program to maintain progress from in clinic treatments.    Status On-going             PT Long Term Goals - 09/04/20 1027      PT LONG TERM GOAL #1   Title Patient will demonstrate/report pain at worst less than or equal to 2/10 to facilitate minimal limitation in daily activity secondary to pain symptoms.    Status On-going      PT LONG TERM GOAL #2   Title Patient will demonstrate independent use of home exercise program to facilitate ability to maintain/progress functional gains from skilled physical therapy services.    Status On-going      PT LONG TERM GOAL #3   Title Patient will demonstrate bilateral GH joint mobility WFL to facilitate usual self care, dressing, reaching overhead at PLOF s limitation due to symptoms.    Status On-going      PT LONG TERM GOAL #4   Title Patient will demonstrate bilateral UE MMT 5/5  throughout to facilitate usual lifting, carrying in functional activity to PLOF s limitation.    Status On-going      PT LONG TERM GOAL #5   Title Pt. will demonstrate FOTO outcome > or = 73    Status On-going                 Plan - 09/04/20 1025    Clinical Impression Statement Pt with good response to dry needling last visit and taping. Pt reporting no pain in his left shoulder. Pt's right shoulder was taped for upper trap inhibition. Next visit reassess for DN in right upper trap. Continue with strengthening, ROM, joint mobs and streching to progress toward LTG's.    Personal Factors and Comorbidities Other    Examination-Activity Limitations Sleep;Carry;Dressing;Lift;Reach Overhead    Examination-Participation Restrictions Occupation;Meal Prep;Community Activity    Stability/Clinical Decision Making Stable/Uncomplicated    Rehab Potential Good    PT Frequency 2x / week    PT Treatment/Interventions ADLs/Self Care Home Management;Electrical Stimulation;Cryotherapy;Iontophoresis 4mg /ml Dexamethasone;Moist Heat;Traction;Balance training;Therapeutic exercise;Therapeutic activities;Functional mobility training;Stair training;Gait training;DME Instruction;Ultrasound;Neuromuscular re-education;Passive range of motion;Spinal Manipulations;Joint Manipulations;Dry needling;Patient/family education;Taping;Manual techniques    PT Next Visit Plan DN right upper trap, pt had a great response with left upper trap    PT Home Exercise Plan [email or web address]    Consulted and Agree with Plan of Care Patient           Patient will benefit from skilled therapeutic intervention in order to improve the following deficits and impairments:  Decreased endurance,Pain,Impaired UE functional use,Increased fascial restricitons,Decreased strength,Decreased activity tolerance,Decreased mobility,Decreased range of motion,Improper body mechanics,Postural dysfunction,Impaired flexibility,Decreased coordination  Visit  Diagnosis: Acute pain of right shoulder  Acute pain of left shoulder  Muscle weakness (generalized)  Abnormal posture     Problem List Patient Active Problem List   Diagnosis Date Noted  . Bilateral carpal tunnel syndrome 02/02/2020  . Chronic pruritic rash in adult 10/21/2019  . Pain in both hands 08/18/2019  . Acute pain of right shoulder 08/18/2019  . Hyperlipidemia 06/09/2017  . Enlarged tonsils 11/11/2016  . Tinea pedis of right foot 11/11/2016  . Numbness 11/11/2016  . Positive H. pylori test 07/23/2016  . Chronic nasal congestion 01/18/2015    03/20/2015, PT, MPT 09/04/2020, 10:29 AM  [facility] [address] [place], [place], [place] Phone: [phone]   Fax:  [phone]  Name: Andrew Sandoval MRN: Cletis Media  Date of Birth: 29-Feb-1981

## 2020-09-06 ENCOUNTER — Ambulatory Visit (INDEPENDENT_AMBULATORY_CARE_PROVIDER_SITE_OTHER): Payer: Self-pay | Admitting: Rehabilitative and Restorative Service Providers"

## 2020-09-06 ENCOUNTER — Other Ambulatory Visit: Payer: Self-pay

## 2020-09-06 ENCOUNTER — Encounter: Payer: Self-pay | Admitting: Rehabilitative and Restorative Service Providers"

## 2020-09-06 DIAGNOSIS — M6281 Muscle weakness (generalized): Secondary | ICD-10-CM

## 2020-09-06 DIAGNOSIS — R293 Abnormal posture: Secondary | ICD-10-CM

## 2020-09-06 DIAGNOSIS — M25512 Pain in left shoulder: Secondary | ICD-10-CM

## 2020-09-06 DIAGNOSIS — M25511 Pain in right shoulder: Secondary | ICD-10-CM

## 2020-09-06 NOTE — Patient Instructions (Signed)
Access Code: [facility] URL: [email or web address] Date: 09/06/2020 Prepared by: Chyrel Masson  Exercises Seated Scapular Retraction - 2 x daily - 7 x weekly - 2 sets - 10 reps - 5 hold Supine shoulder flexion with band - 1 x daily - 3 sets - 10 reps Supine Shoulder Horizontal Abduction with Resistance - 1 x daily - 10 reps - 3 sets Supine Shoulder External Rotation with Resistance - 1 x daily - 10 reps - 3 sets Seated Cervical Sidebending Stretch - 1 x daily - 1-3 reps - 20 sec hold Standing Shoulder Row with Anchored Resistance - 2 x daily - 7 x weekly - 10 reps - 3 sets Shoulder Extension with Resistance - 2 x daily - 7 x weekly - 10 reps - 3 sets Shoulder External Rotation Reactive Isometrics - 1 x daily - 7 x weekly - 1 sets - 15 reps - 5 hold

## 2020-09-06 NOTE — Therapy (Signed)
[facility] Physical Therapy [address] [place], [place], [address] Phone: [phone]   Fax:  [phone]  Physical Therapy Treatment  Patient Details  Name: Andrew Sandoval MRN: [id number] Date of Birth: September 28, 1980 Referring Provider (PT): Dr. Prince Rome   Encounter Date: 09/06/2020   PT End of Session - 09/06/20 0903    Visit Number 5    Number of Visits 12    Date for PT Re-Evaluation 10/11/20    Authorization Type CAFA 10/10/2020    Progress Note Due on Visit 10    PT Start Time 0845    PT Stop Time 0924    PT Time Calculation (min) 39 min    Activity Tolerance Patient tolerated treatment well    Behavior During Therapy [facility] for tasks assessed/performed           Past Medical History:  Diagnosis Date  . Cocaine use 2006-2010    Past Surgical History:  Procedure Laterality Date  . No prior surgery      There were no vitals filed for this visit.   Subjective Assessment - 09/06/20 0851    Subjective Pt. indicated feeling Rt side complaints today upon arrival.  taping didn't help much.  Reported needling was most help for Lt side    Pertinent History History of bilateral carpal tunnel syndrome.    Limitations House hold activities;Lifting;Other (comment)    Patient Stated Goals Reduce pain, use arm better    Currently in Pain? Yes    Pain Score 5     Pain Location Shoulder    Pain Orientation Right    Pain Descriptors / Indicators Aching    Pain Type Chronic pain    Pain Onset More than a month ago    Pain Frequency Constant    Aggravating Factors  slicer at work, lifting/carrying    Pain Relieving Factors treatment has helped.              [facility] PT Assessment - 09/06/20 0001      Assessment   Medical Diagnosis Bilateral shoulder pain    Referring Provider (PT) Dr. Prince Rome    Onset Date/Surgical Date 07/11/20      Palpation   Palpation comment upper trap TrP c concordant symptoms in upper trap noted bilateral (positive reduction in  pain after manual intervention today)                         [facility] Adult PT Treatment/Exercise - 09/06/20 0001      Exercises   Other Exercises  HEP review/cues for progression      Shoulder Exercises: Standing   Extension Both   2 x 15   Theraband Level (Shoulder Extension) Level 3 (Green)    Row Strengthening   2x 15   Theraband Level (Shoulder Row) Level 3 (Green)    Other Standing Exercises reactive ER walk out c arm at side 5 sec hold x 5 each      Manual Therapy   Soft tissue mobilization compression to Rt upper trap, palpation during DN            Trigger Point Dry Needling - 09/06/20 0001    Consent Given? Yes    Education Handout Provided No   explanation through interpreter   Muscles Treated Head and Neck Upper trapezius   Rt   Upper Trapezius Response Twitch reponse elicited;Palpable increased muscle length  PT Short Term Goals - 09/06/20 0901      PT SHORT TERM GOAL #1   Title Patient will demonstrate independent use of home exercise program to maintain progress from in clinic treatments.    Status Achieved             PT Long Term Goals - 09/04/20 1027      PT LONG TERM GOAL #1   Title Patient will demonstrate/report pain at worst less than or equal to 2/10 to facilitate minimal limitation in daily activity secondary to pain symptoms.    Status On-going      PT LONG TERM GOAL #2   Title Patient will demonstrate independent use of home exercise program to facilitate ability to maintain/progress functional gains from skilled physical therapy services.    Status On-going      PT LONG TERM GOAL #3   Title Patient will demonstrate bilateral GH joint mobility WFL to facilitate usual self care, dressing, reaching overhead at PLOF s limitation due to symptoms.    Status On-going      PT LONG TERM GOAL #4   Title Patient will demonstrate bilateral UE MMT 5/5 throughout to facilitate usual lifting, carrying in  functional activity to PLOF s limitation.    Status On-going      PT LONG TERM GOAL #5   Title Pt. will demonstrate FOTO outcome > or = 73    Status On-going                 Plan - 09/06/20 0900    Clinical Impression Statement Immediate response to manual intervention c no pain noted c Rt arm movement and reduced tension reported.    Personal Factors and Comorbidities Other    Examination-Activity Limitations Sleep;Carry;Dressing;Lift;Reach Overhead    Examination-Participation Restrictions Occupation;Meal Prep;Community Activity    Stability/Clinical Decision Making Stable/Uncomplicated    Rehab Potential Good    PT Frequency 2x / week    PT Treatment/Interventions ADLs/Self Care Home Management;Electrical Stimulation;Cryotherapy;Iontophoresis 4mg /ml Dexamethasone;Moist Heat;Traction;Balance training;Therapeutic exercise;Therapeutic activities;Functional mobility training;Stair training;Gait training;DME Instruction;Ultrasound;Neuromuscular re-education;Passive range of motion;Spinal Manipulations;Joint Manipulations;Dry needling;Patient/family education;Taping;Manual techniques    PT Next Visit Plan DN to upper traps prn based off symptoms.    PT Home Exercise Plan [email or web address]    Consulted and Agree with Plan of Care Patient           Patient will benefit from skilled therapeutic intervention in order to improve the following deficits and impairments:  Decreased endurance,Pain,Impaired UE functional use,Increased fascial restricitons,Decreased strength,Decreased activity tolerance,Decreased mobility,Decreased range of motion,Improper body mechanics,Postural dysfunction,Impaired flexibility,Decreased coordination  Visit Diagnosis: Acute pain of right shoulder  Acute pain of left shoulder  Muscle weakness (generalized)  Abnormal posture     Problem List Patient Active Problem List   Diagnosis Date Noted  . Bilateral carpal tunnel syndrome 02/02/2020  . Chronic  pruritic rash in adult 10/21/2019  . Pain in both hands 08/18/2019  . Acute pain of right shoulder 08/18/2019  . Hyperlipidemia 06/09/2017  . Enlarged tonsils 11/11/2016  . Tinea pedis of right foot 11/11/2016  . Numbness 11/11/2016  . Positive H. pylori test 07/23/2016  . Chronic nasal congestion 01/18/2015    03/20/2015, PT, DPT, OCS, ATC 09/06/20  9:20 AM    [facility] Physical Therapy [address] [place], [place], [place] Phone: [phone]   Fax:  [phone]  Name: Andrew Sandoval MRN: Cletis Media Date of Birth: 02/29/1981

## 2020-09-09 ENCOUNTER — Other Ambulatory Visit: Payer: Self-pay

## 2020-09-11 ENCOUNTER — Ambulatory Visit (INDEPENDENT_AMBULATORY_CARE_PROVIDER_SITE_OTHER): Payer: Self-pay | Admitting: Physical Therapy

## 2020-09-11 ENCOUNTER — Encounter: Payer: Self-pay | Admitting: Physical Therapy

## 2020-09-11 ENCOUNTER — Other Ambulatory Visit: Payer: Self-pay

## 2020-09-11 DIAGNOSIS — M6281 Muscle weakness (generalized): Secondary | ICD-10-CM

## 2020-09-11 DIAGNOSIS — R293 Abnormal posture: Secondary | ICD-10-CM

## 2020-09-11 DIAGNOSIS — M25512 Pain in left shoulder: Secondary | ICD-10-CM

## 2020-09-11 DIAGNOSIS — M25511 Pain in right shoulder: Secondary | ICD-10-CM

## 2020-09-11 NOTE — Therapy (Signed)
[facility] Physical Therapy [address] [place], [place], [address] Phone: [phone]   Fax:  [phone]  Physical Therapy Treatment  Patient Details  Name: Andrew Sandoval MRN: [id number] Date of Birth: 03-21-1981 Referring Provider (PT): Dr. Prince Rome   Encounter Date: 09/11/2020   PT End of Session - 09/11/20 0811    Visit Number 6    Number of Visits 12    Date for PT Re-Evaluation 10/11/20    Authorization Type CAFA 10/10/2020    Progress Note Due on Visit 10    PT Start Time 0802    PT Stop Time 0840    PT Time Calculation (min) 38 min    Activity Tolerance Patient tolerated treatment well    Behavior During Therapy [facility] for tasks assessed/performed           Past Medical History:  Diagnosis Date  . Cocaine use 2006-2010    Past Surgical History:  Procedure Laterality Date  . No prior surgery      There were no vitals filed for this visit.   Subjective Assessment - 09/11/20 0809    Subjective Pt reporting no pain upon arrival today. "it feels good today". Pt thinks the needling is helping.    Pertinent History History of bilateral carpal tunnel syndrome.    Limitations House hold activities;Lifting;Other (comment)    Patient Stated Goals Reduce pain, use arm better    Currently in Pain? No/denies                             [facility] Adult PT Treatment/Exercise - 09/11/20 0001      Shoulder Exercises: Standing   External Rotation Strengthening;20 reps;Theraband    Theraband Level (Shoulder External Rotation) Level 3 (Green)    Extension Both   2 x 15   Theraband Level (Shoulder Extension) Level 3 (Green)    Row Strengthening   2x 15   Theraband Level (Shoulder Row) Level 3 (Green)      Shoulder Exercises: ROM/Strengthening   UBE (Upper Arm Bike) 2 minutes forward/ 2 minutes backward L 1.5    Lat Pull Limitations 15# 2x10    Other ROM/Strengthening Exercises BATCA rows x 15# 2x10      Shoulder Exercises: Stretch    Other Shoulder Stretches Door stretch: ER x3 holding 20 seconds each      Modalities   Modalities Cryotherapy      Cryotherapy   Number Minutes Cryotherapy 5 Minutes    Cryotherapy Location Hand    Type of Cryotherapy Ice pack      Manual Therapy   Soft tissue mobilization STM to R wrist flexor and extensors, proximal MP joint mobs due to mild pain and inflammation in R hand and wrist.                  PT Education - 09/11/20 0811    Education Details Exercise technique and form    Person(s) Educated Patient    Methods Explanation;Demonstration;Tactile cues    Comprehension Verbalized understanding;Returned demonstration            PT Short Term Goals - 09/11/20 0820      PT SHORT TERM GOAL #1   Title Patient will demonstrate independent use of home exercise program to maintain progress from in clinic treatments.    Status Achieved             PT Long Term Goals - 09/11/20 5009  PT LONG TERM GOAL #1   Title Patient will demonstrate/report pain at worst less than or equal to 2/10 to facilitate minimal limitation in daily activity secondary to pain symptoms.    Status On-going      PT LONG TERM GOAL #2   Title Patient will demonstrate independent use of home exercise program to facilitate ability to maintain/progress functional gains from skilled physical therapy services.    Status On-going      PT LONG TERM GOAL #3   Title Patient will demonstrate bilateral GH joint mobility WFL to facilitate usual self care, dressing, reaching overhead at PLOF s limitation due to symptoms.    Period Weeks    Status On-going      PT LONG TERM GOAL #4   Title Patient will demonstrate bilateral UE MMT 5/5 throughout to facilitate usual lifting, carrying in functional activity to PLOF s limitation.    Status On-going      PT LONG TERM GOAL #5   Title Pt. will demonstrate FOTO outcome > or = 73    Status On-going                 Plan - 09/11/20 2542     Clinical Impression Statement Pt reporting good response to dry needling last visit. Pt reporting no pain upon arrival today. Pt tolerating exercises well with instructions for form and technique. Pt reporting taping didn't seem to help the last time it was tried.  We discussed needling again at next visit if pt's pain returned in his shoulders and will continue to reassess pain in right hand/wrist. Continue skilled PT progressing toward goals set.    Personal Factors and Comorbidities Other    Examination-Activity Limitations Sleep;Carry;Dressing;Lift;Reach Overhead    Examination-Participation Restrictions Occupation;Meal Prep;Community Activity    Stability/Clinical Decision Making Stable/Uncomplicated    Rehab Potential Good    PT Frequency 2x / week    PT Duration 8 weeks    PT Treatment/Interventions ADLs/Self Care Home Management;Electrical Stimulation;Cryotherapy;Iontophoresis 4mg /ml Dexamethasone;Moist Heat;Traction;Balance training;Therapeutic exercise;Therapeutic activities;Functional mobility training;Stair training;Gait training;DME Instruction;Ultrasound;Neuromuscular re-education;Passive range of motion;Spinal Manipulations;Joint Manipulations;Dry needling;Patient/family education;Taping;Manual techniques    PT Next Visit Plan DN to upper traps prn based off symptoms.    PT Home Exercise Plan [id number]    Consulted and Agree with Plan of Care Patient           Patient will benefit from skilled therapeutic intervention in order to improve the following deficits and impairments:  Decreased endurance,Pain,Impaired UE functional use,Increased fascial restricitons,Decreased strength,Decreased activity tolerance,Decreased mobility,Decreased range of motion,Improper body mechanics,Postural dysfunction,Impaired flexibility,Decreased coordination  Visit Diagnosis: Acute pain of right shoulder  Acute pain of left shoulder  Muscle weakness (generalized)  Abnormal posture     Problem  List Patient Active Problem List   Diagnosis Date Noted  . Bilateral carpal tunnel syndrome 02/02/2020  . Chronic pruritic rash in adult 10/21/2019  . Pain in both hands 08/18/2019  . Acute pain of right shoulder 08/18/2019  . Hyperlipidemia 06/09/2017  . Enlarged tonsils 11/11/2016  . Tinea pedis of right foot 11/11/2016  . Numbness 11/11/2016  . Positive H. pylori test 07/23/2016  . Chronic nasal congestion 01/18/2015    03/20/2015, PT, MPT 09/11/2020, 8:47 AM  [facility] Physical Therapy [address] [place], [place], [place] Phone: [phone]   Fax:  [phone]  Name: Andrew Sandoval MRN: Cletis Media Date of Birth: [birth year]

## 2020-09-12 ENCOUNTER — Other Ambulatory Visit: Payer: Self-pay

## 2020-09-12 ENCOUNTER — Other Ambulatory Visit: Payer: Self-pay | Admitting: Family Medicine

## 2020-09-12 MED ORDER — FLUTICASONE PROPIONATE 50 MCG/ACT NA SUSP
2.0000 | Freq: Every day | NASAL | 1 refills | Status: DC
  Filled 2020-09-12: qty 16, 30d supply, fill #0

## 2020-09-12 MED FILL — Atorvastatin Calcium Tab 40 MG (Base Equivalent): ORAL | 30 days supply | Qty: 30 | Fill #0 | Status: AC

## 2020-09-12 MED FILL — Atorvastatin Calcium Tab 40 MG (Base Equivalent): ORAL | 30 days supply | Qty: 30 | Fill #0 | Status: CN

## 2020-09-13 ENCOUNTER — Other Ambulatory Visit: Payer: Self-pay

## 2020-09-13 ENCOUNTER — Encounter: Payer: Self-pay | Admitting: Rehabilitative and Restorative Service Providers"

## 2020-09-13 ENCOUNTER — Ambulatory Visit (INDEPENDENT_AMBULATORY_CARE_PROVIDER_SITE_OTHER): Payer: Self-pay | Admitting: Rehabilitative and Restorative Service Providers"

## 2020-09-13 ENCOUNTER — Ambulatory Visit: Payer: Self-pay | Admitting: Family Medicine

## 2020-09-13 DIAGNOSIS — M25512 Pain in left shoulder: Secondary | ICD-10-CM

## 2020-09-13 DIAGNOSIS — M25511 Pain in right shoulder: Secondary | ICD-10-CM

## 2020-09-13 DIAGNOSIS — R293 Abnormal posture: Secondary | ICD-10-CM

## 2020-09-13 DIAGNOSIS — M6281 Muscle weakness (generalized): Secondary | ICD-10-CM

## 2020-09-13 NOTE — Therapy (Signed)
[facility] Physical Therapy [address] [place], [place], [address] Phone: [phone]   Fax:  [phone]  Physical Therapy Treatment/Progress note  Patient Details  Name: Andrew Sandoval MRN: [id number] Date of Birth: 09-Oct-1980 Referring Provider (PT): Dr. Prince Rome   Encounter Date: 09/13/2020   Progress Note Reporting Period 08/16/2020 to 09/13/2020  See note below for Objective Data and Assessment of Progress/Goals.        PT End of Session - 09/13/20 0811    Visit Number 7    Number of Visits 12    Date for PT Re-Evaluation 10/11/20    Authorization Type CAFA 10/10/2020    Progress Note Due on Visit 12    PT Start Time 0804    PT Stop Time 0842    PT Time Calculation (min) 38 min    Activity Tolerance Patient tolerated treatment well    Behavior During Therapy [facility] for tasks assessed/performed           Past Medical History:  Diagnosis Date  . Cocaine use 2006-2010    Past Surgical History:  Procedure Laterality Date  . No prior surgery      There were no vitals filed for this visit.   Subjective Assessment - 09/13/20 0809    Subjective Pt. stated no pain upon arrival and felt very small amount of pain since last visit.  Deferred dry needling today due to symptom relief.  Pt. attended half of the visit today without interpreter present.  Clinician used knowledge of spanish, Pt.'s knowledge of English and google translate for instructions.    Patient is accompained by: Interpreter    Pertinent History History of bilateral carpal tunnel syndrome.    Limitations House hold activities;Lifting;Other (comment)    Patient Stated Goals Reduce pain, use arm better    Currently in Pain? No/denies              [facility] PT Assessment - 09/13/20 0001      Assessment   Medical Diagnosis Bilateral shoulder pain    Referring Provider (PT) Dr. Prince Rome    Onset Date/Surgical Date 07/11/20      Observation/Other Assessments   Focus on Therapeutic  Outcomes (FOTO)  70%      AROM   Overall AROM Comments AROM WFL at this time GH jt, no pain reported c HBB to mid thoracic region bilateral      Strength   Right Shoulder Flexion 5/5    Right Shoulder ABduction 5/5    Right Shoulder Internal Rotation 5/5    Right Shoulder External Rotation 5/5    Left Shoulder Flexion 4+/5    Left Shoulder ABduction 4/5    Left Shoulder Internal Rotation 5/5    Left Shoulder External Rotation 4+/5                         [facility] Adult PT Treatment/Exercise - 09/13/20 0001      Shoulder Exercises: Seated   Other Seated Exercises seated lat pull down blue band 3 x 15 bilateral      Shoulder Exercises: Standing   External Rotation Strengthening   3 x 10 bilateral   Theraband Level (Shoulder External Rotation) Level 4 (Blue)    Row Strengthening   3 x 15   Theraband Level (Shoulder Row) Level 4 (Blue)    Other Standing Exercises body blade er/ir in neutral, flexion punch 20 sec x 4 each bilateral      Shoulder  Exercises: ROM/Strengthening   UBE (Upper Arm Bike) 3 mins fwd/back each way lvl 3.5      Shoulder Exercises: Stretch   Other Shoulder Stretches Door stretch: ER x3 holding 20 seconds each      Manual Therapy   Manual therapy comments Left out today due to no complaints                    PT Short Term Goals - 09/11/20 0820      PT SHORT TERM GOAL #1   Title Patient will demonstrate independent use of home exercise program to maintain progress from in clinic treatments.    Status Achieved             PT Long Term Goals - 09/13/20 0846      PT LONG TERM GOAL #1   Title Patient will demonstrate/report pain at worst less than or equal to 2/10 to facilitate minimal limitation in daily activity secondary to pain symptoms.    Time 8    Status On-going    Target Date 10/11/20      PT LONG TERM GOAL #2   Title Patient will demonstrate independent use of home exercise program to facilitate ability to  maintain/progress functional gains from skilled physical therapy services.    Time 8    Period Weeks    Status Achieved      PT LONG TERM GOAL #3   Title Patient will demonstrate bilateral GH joint mobility WFL to facilitate usual self care, dressing, reaching overhead at PLOF s limitation due to symptoms.    Time 8    Period Weeks    Status Achieved    Target Date 10/11/20      PT LONG TERM GOAL #4   Title Patient will demonstrate bilateral UE MMT 5/5 throughout to facilitate usual lifting, carrying in functional activity to PLOF s limitation.    Time 8    Period Weeks    Status On-going    Target Date 10/11/20      PT LONG TERM GOAL #5   Title Pt. will demonstrate FOTO outcome > or = 73    Time 8    Period Weeks    Status On-going    Target Date 10/11/20                 Plan - 09/13/20 0817    Clinical Impression Statement Pt. has attended 7 visits overall during course of treatment cycle.  Pt. has reported reduction in pain compared to evaluation.  See objective data for updated information showing gains in mobility and strength related to UE at this time.  Pt. may continue to beneift from skilled PT services c ultimate transitioning to HEP as appropraite.    Personal Factors and Comorbidities Other    Examination-Activity Limitations Sleep;Carry;Dressing;Lift;Reach Overhead    Examination-Participation Restrictions Occupation;Meal Prep;Community Activity    Stability/Clinical Decision Making Stable/Uncomplicated    Rehab Potential Good    PT Frequency 2x / week    PT Duration 8 weeks    PT Treatment/Interventions ADLs/Self Care Home Management;Electrical Stimulation;Cryotherapy;Iontophoresis 4mg /ml Dexamethasone;Moist Heat;Traction;Balance training;Therapeutic exercise;Therapeutic activities;Functional mobility training;Stair training;Gait training;DME Instruction;Ultrasound;Neuromuscular re-education;Passive range of motion;Spinal Manipulations;Joint Manipulations;Dry  needling;Patient/family education;Taping;Manual techniques    PT Next Visit Plan UE strengthening/endurance for daily functional lift/carry    PT Home Exercise Plan [facility]    Consulted and Agree with Plan of Care Patient           Patient will benefit  from skilled therapeutic intervention in order to improve the following deficits and impairments:  Decreased endurance,Pain,Impaired UE functional use,Increased fascial restricitons,Decreased strength,Decreased activity tolerance,Decreased mobility,Decreased range of motion,Improper body mechanics,Postural dysfunction,Impaired flexibility,Decreased coordination  Visit Diagnosis: Acute pain of right shoulder  Acute pain of left shoulder  Muscle weakness (generalized)  Abnormal posture     Problem List Patient Active Problem List   Diagnosis Date Noted  . Bilateral carpal tunnel syndrome 02/02/2020  . Chronic pruritic rash in adult 10/21/2019  . Pain in both hands 08/18/2019  . Acute pain of right shoulder 08/18/2019  . Hyperlipidemia 06/09/2017  . Enlarged tonsils 11/11/2016  . Tinea pedis of right foot 11/11/2016  . Numbness 11/11/2016  . Positive H. pylori test 07/23/2016  . Chronic nasal congestion 01/18/2015    Chyrel Masson, PT, DPT, OCS, ATC 09/13/20  8:51 AM    [facility] Physical Therapy [address] [place], [place], [address] Phone: [phone]   Fax:  [phone]  Name: Kaeo Jacome MRN: [id number] Date of Birth: 07-03-1980

## 2020-09-18 ENCOUNTER — Other Ambulatory Visit: Payer: Self-pay

## 2020-09-18 ENCOUNTER — Ambulatory Visit (INDEPENDENT_AMBULATORY_CARE_PROVIDER_SITE_OTHER): Payer: Self-pay | Admitting: Rehabilitative and Restorative Service Providers"

## 2020-09-18 DIAGNOSIS — M6281 Muscle weakness (generalized): Secondary | ICD-10-CM

## 2020-09-18 DIAGNOSIS — R293 Abnormal posture: Secondary | ICD-10-CM

## 2020-09-18 DIAGNOSIS — M25512 Pain in left shoulder: Secondary | ICD-10-CM

## 2020-09-18 DIAGNOSIS — M25511 Pain in right shoulder: Secondary | ICD-10-CM

## 2020-09-18 NOTE — Therapy (Signed)
[facility] Physical Therapy [address] [place], [place], [address] Phone: [phone]   Fax:  [phone]  Physical Therapy Treatment  Patient Details  Name: Andrew Sandoval MRN: [id number] Date of Birth: Feb 16, 1981 Referring Provider (PT): Dr. Prince Rome   Encounter Date: 09/18/2020   PT End of Session - 09/18/20 0811    Visit Number 8    Number of Visits 12    Date for PT Re-Evaluation 10/11/20    Authorization Type CAFA 10/10/2020    Progress Note Due on Visit 12    PT Start Time 0800    PT Stop Time 0840    PT Time Calculation (min) 40 min    Activity Tolerance Patient tolerated treatment well    Behavior During Therapy [facility] for tasks assessed/performed           Past Medical History:  Diagnosis Date  . Cocaine use 2006-2010    Past Surgical History:  Procedure Laterality Date  . No prior surgery      There were no vitals filed for this visit.   Subjective Assessment - 09/18/20 0809    Subjective Pt. indicated alittle complaints in Rt neck/shoulder upon arrival today, also some Rt hand complaints.    Patient is accompained by: Interpreter    Pertinent History History of bilateral carpal tunnel syndrome.    Limitations House hold activities;Lifting;Other (comment)    Patient Stated Goals Reduce pain, use arm better    Currently in Pain? Yes    Pain Score 4     Pain Location Shoulder   Rt cervical   Pain Orientation Right    Pain Descriptors / Indicators Aching;Tightness    Pain Type Chronic pain    Pain Onset More than a month ago    Pain Frequency Intermittent    Aggravating Factors  insidious tightening over weekend    Pain Relieving Factors Treatment has helped                             [facility] Adult PT Treatment/Exercise - 09/18/20 0001      Shoulder Exercises: Standing   Other Standing Exercises body blade er/ir in neutral, flexion punch 20 sec x 4 each bilateral    Other Standing Exercises wrist stretch into  flexion 15 sec x 3      Shoulder Exercises: ROM/Strengthening   UBE (Upper Arm Bike) Lvl 3.5 3 mins fwd/back each way    Lat Pull Other (comment)   3 x 15 20 lbs   Cybex Row Other (comment)   3 x 15 25 lbs     Manual Therapy   Manual therapy comments compression to Rt upper trap            Trigger Point Dry Needling - 09/18/20 0001    Consent Given? Yes    Education Handout Provided No   explantion previously through interpreter   Muscles Treated Head and Neck Upper trapezius   Rt   Upper Trapezius Response Twitch reponse elicited;Palpable increased muscle length                  PT Short Term Goals - 09/11/20 0820      PT SHORT TERM GOAL #1   Title Patient will demonstrate independent use of home exercise program to maintain progress from in clinic treatments.    Status Achieved             PT Long Term Goals -  09/13/20 0846      PT LONG TERM GOAL #1   Title Patient will demonstrate/report pain at worst less than or equal to 2/10 to facilitate minimal limitation in daily activity secondary to pain symptoms.    Time 8    Status On-going    Target Date 10/11/20      PT LONG TERM GOAL #2   Title Patient will demonstrate independent use of home exercise program to facilitate ability to maintain/progress functional gains from skilled physical therapy services.    Time 8    Period Weeks    Status Achieved      PT LONG TERM GOAL #3   Title Patient will demonstrate bilateral [place] joint mobility WFL to facilitate usual self care, dressing, reaching overhead at PLOF s limitation due to symptoms.    Time 8    Period Weeks    Status Achieved    Target Date 10/11/20      PT LONG TERM GOAL #4   Title Patient will demonstrate bilateral UE MMT 5/5 throughout to facilitate usual lifting, carrying in functional activity to PLOF s limitation.    Time 8    Period Weeks    Status On-going    Target Date 10/11/20      PT LONG TERM GOAL #5   Title Pt. will demonstrate  FOTO outcome > or = 73    Time 8    Period Weeks    Status On-going    Target Date 10/11/20                 Plan - 09/18/20 0815    Clinical Impression Statement Mild complaints of Rt superior shoulder, Rt cervical complaints returned over weekend, treated with myofascial release c good initial in clinic response again.  Palpation to shaft of Rt 2nd metacarpal produced tenderness/pain with no concordant symptoms c passive/active wrist, digit ROM, wrist flexion/extension, supination/pronation MMT today.    Personal Factors and Comorbidities Other    Examination-Activity Limitations Sleep;Carry;Dressing;Lift;Reach Overhead    Examination-Participation Restrictions Occupation;Meal Prep;Community Activity    Stability/Clinical Decision Making Stable/Uncomplicated    Rehab Potential Good    PT Frequency 2x / week    PT Duration 8 weeks    PT Treatment/Interventions ADLs/Self Care Home Management;Electrical Stimulation;Cryotherapy;Iontophoresis 4mg /ml Dexamethasone;Moist Heat;Traction;Balance training;Therapeutic exercise;Therapeutic activities;Functional mobility training;Stair training;Gait training;DME Instruction;Ultrasound;Neuromuscular re-education;Passive range of motion;Spinal Manipulations;Joint Manipulations;Dry needling;Patient/family education;Taping;Manual techniques    PT Next Visit Plan UE strengthening/endurance for daily functional lift/carry    PT Home Exercise Plan [id number]    Consulted and Agree with Plan of Care Patient           Patient will benefit from skilled therapeutic intervention in order to improve the following deficits and impairments:  Decreased endurance,Pain,Impaired UE functional use,Increased fascial restricitons,Decreased strength,Decreased activity tolerance,Decreased mobility,Decreased range of motion,Improper body mechanics,Postural dysfunction,Impaired flexibility,Decreased coordination  Visit Diagnosis: Acute pain of right shoulder  Acute  pain of left shoulder  Muscle weakness (generalized)  Abnormal posture     Problem List Patient Active Problem List   Diagnosis Date Noted  . Bilateral carpal tunnel syndrome 02/02/2020  . Chronic pruritic rash in adult 10/21/2019  . Pain in both hands 08/18/2019  . Acute pain of right shoulder 08/18/2019  . Hyperlipidemia 06/09/2017  . Enlarged tonsils 11/11/2016  . Tinea pedis of right foot 11/11/2016  . Numbness 11/11/2016  . Positive H. pylori test 07/23/2016  . Chronic nasal congestion 01/18/2015    03/20/2015, PT, DPT, OCS, ATC  09/18/20  8:35 AM    [facility] Physical Therapy [address] [place], [place], [address] Phone: [phone]   Fax:  [phone]  Name: Wister Hoefle MRN: [id number] Date of Birth: 10/10/1980

## 2020-09-20 ENCOUNTER — Ambulatory Visit (INDEPENDENT_AMBULATORY_CARE_PROVIDER_SITE_OTHER): Payer: Self-pay | Admitting: Family Medicine

## 2020-09-20 ENCOUNTER — Encounter: Payer: Self-pay | Admitting: Family Medicine

## 2020-09-20 ENCOUNTER — Ambulatory Visit (INDEPENDENT_AMBULATORY_CARE_PROVIDER_SITE_OTHER): Payer: Self-pay | Admitting: Rehabilitative and Restorative Service Providers"

## 2020-09-20 ENCOUNTER — Encounter: Payer: Self-pay | Admitting: Rehabilitative and Restorative Service Providers"

## 2020-09-20 ENCOUNTER — Other Ambulatory Visit: Payer: Self-pay

## 2020-09-20 DIAGNOSIS — G5603 Carpal tunnel syndrome, bilateral upper limbs: Secondary | ICD-10-CM

## 2020-09-20 DIAGNOSIS — M25512 Pain in left shoulder: Secondary | ICD-10-CM

## 2020-09-20 DIAGNOSIS — G5602 Carpal tunnel syndrome, left upper limb: Secondary | ICD-10-CM

## 2020-09-20 DIAGNOSIS — R293 Abnormal posture: Secondary | ICD-10-CM

## 2020-09-20 DIAGNOSIS — G5601 Carpal tunnel syndrome, right upper limb: Secondary | ICD-10-CM

## 2020-09-20 DIAGNOSIS — M6281 Muscle weakness (generalized): Secondary | ICD-10-CM

## 2020-09-20 DIAGNOSIS — M79641 Pain in right hand: Secondary | ICD-10-CM

## 2020-09-20 DIAGNOSIS — M25511 Pain in right shoulder: Secondary | ICD-10-CM

## 2020-09-20 NOTE — Patient Instructions (Signed)
   Glucosamine Sulfate:  1,000 mg twice daily  Turmeric:  500 mg twice daily   

## 2020-09-20 NOTE — Therapy (Signed)
[facility] Physical Therapy [address] [place], [place], [address] Phone: [phone]   Fax:  [phone]  Physical Therapy Treatment  Patient Details  Name: Andrew Sandoval MRN: [id number] Date of Birth: [birth year] Referring Provider (PT): Dr. Junius Roads   Encounter Date: 09/20/2020   PT End of Session - 09/20/20 0811    Visit Number 9    Number of Visits 12    Date for PT Re-Evaluation 10/11/20    Authorization Type CAFA 10/10/2020    Progress Note Due on Visit 12    PT Start Time 0806    PT Stop Time 0844    PT Time Calculation (min) 38 min    Activity Tolerance Patient tolerated treatment well    Behavior During Therapy [facility] for tasks assessed/performed           Past Medical History:  Diagnosis Date  . Cocaine use 2006-2010    Past Surgical History:  Procedure Laterality Date  . No prior surgery      There were no vitals filed for this visit.   Subjective Assessment - 09/20/20 0810    Subjective Pt. indicated no real complaints of pain in neck or shoulder today upon arrival.  Pt. stated Rt hand still noted at times.    Patient is accompained by: Interpreter    Pertinent History History of bilateral carpal tunnel syndrome.    Limitations House hold activities;Lifting;Other (comment)    Patient Stated Goals Reduce pain, use arm better    Currently in Pain? No/denies    Pain Location Shoulder    Pain Orientation Right    Pain Onset More than a month ago              [facility] PT Assessment - 09/20/20 0001      Assessment   Medical Diagnosis Bilateral shoulder pain    Referring Provider (PT) Dr. Junius Roads    Onset Date/Surgical Date 07/11/20    Hand Dominance Right      AROM   Overall AROM Comments AROM WFL at this time [place] jt, no pain reported c HBB to mid thoracic region bilateral      Strength   Overall Strength Comments No pain complaints communicated    Right Shoulder Flexion 5/5    Right Shoulder ABduction 5/5    Right Shoulder  Internal Rotation 5/5    Right Shoulder External Rotation 5/5    Left Shoulder Flexion 5/5    Left Shoulder ABduction 5/5    Left Shoulder Internal Rotation 5/5    Left Shoulder External Rotation 5/5      Palpation   Palpation comment Tenderness to touch along 2nd metacarpal, noted posteriorly on Rt hand                         [facility] Adult PT Treatment/Exercise - 09/20/20 0001      Shoulder Exercises: ROM/Strengthening   UBE (Upper Arm Bike) Lvl 3.5 4 mins fwd/back c 15 second intervals at top of each minute for faster pace    Lat Pull Other (comment)   3 x 15 20 lbs   Cybex Row Other (comment)   3 x 15 25 lbs                 PT Education - 09/20/20 4765    Education Details Review of HEP.    Person(s) Educated Patient    Methods Explanation;Demonstration;Verbal cues    Comprehension Verbalized understanding;Returned demonstration  PT Short Term Goals - 09/11/20 0820      PT SHORT TERM GOAL #1   Title Patient will demonstrate independent use of home exercise program to maintain progress from in clinic treatments.    Status Achieved             PT Long Term Goals - 09/20/20 9811      PT LONG TERM GOAL #1   Title Patient will demonstrate/report pain at worst less than or equal to 2/10 to facilitate minimal limitation in daily activity secondary to pain symptoms.    Time 8    Status Partially Met      PT LONG TERM GOAL #2   Title Patient will demonstrate independent use of home exercise program to facilitate ability to maintain/progress functional gains from skilled physical therapy services.    Time 8    Period Weeks    Status Achieved      PT LONG TERM GOAL #3   Title Patient will demonstrate bilateral [place] joint mobility WFL to facilitate usual self care, dressing, reaching overhead at PLOF s limitation due to symptoms.    Time 8    Period Weeks    Status Achieved      PT LONG TERM GOAL #4   Title Patient will demonstrate  bilateral UE MMT 5/5 throughout to facilitate usual lifting, carrying in functional activity to PLOF s limitation.    Time 8    Period Weeks    Status Achieved      PT LONG TERM GOAL #5   Title Pt. will demonstrate FOTO outcome > or = 73    Time 8    Period Weeks    Status On-going                 Plan - 09/20/20 9147    Clinical Impression Statement Strength assessment for shoulder movements in both arms showed good improvement compared to evaluation and reaching established goals.  Pain variable but mild for cervical/shoulder region and improved c use of HEP noted at this time.  Pt. does have complaints of Rt hand related to tenderness along 2nd metacarpal with no specific pain indicated c finger/wrist motion or strength testing.   Pt. may be appropriate for transitioning to HEP at this time due to improvements.  Return to MD today, will adjust and review plan on next visit.    Personal Factors and Comorbidities Other    Examination-Activity Limitations Sleep;Carry;Dressing;Lift;Reach Overhead    Examination-Participation Restrictions Occupation;Meal Prep;Community Activity    Stability/Clinical Decision Making Stable/Uncomplicated    Rehab Potential Good    PT Frequency 2x / week    PT Duration 8 weeks    PT Treatment/Interventions ADLs/Self Care Home Management;Electrical Stimulation;Cryotherapy;Iontophoresis 62m/ml Dexamethasone;Moist Heat;Traction;Balance training;Therapeutic exercise;Therapeutic activities;Functional mobility training;Stair training;Gait training;DME Instruction;Ultrasound;Neuromuscular re-education;Passive range of motion;Spinal Manipulations;Joint Manipulations;Dry needling;Patient/family education;Taping;Manual techniques    PT Next Visit Plan MD visit follow up, review results, possible HEP transitioning.    PT Home Exercise Plan [id number]    Consulted and Agree with Plan of Care Patient           Patient will benefit from skilled therapeutic  intervention in order to improve the following deficits and impairments:  Decreased endurance,Pain,Impaired UE functional use,Increased fascial restricitons,Decreased strength,Decreased activity tolerance,Decreased mobility,Decreased range of motion,Improper body mechanics,Postural dysfunction,Impaired flexibility,Decreased coordination  Visit Diagnosis: Acute pain of right shoulder  Acute pain of left shoulder  Muscle weakness (generalized)  Abnormal posture     Problem List Patient  Active Problem List   Diagnosis Date Noted  . Bilateral carpal tunnel syndrome 02/02/2020  . Chronic pruritic rash in adult 10/21/2019  . Pain in both hands 08/18/2019  . Acute pain of right shoulder 08/18/2019  . Hyperlipidemia 06/09/2017  . Enlarged tonsils 11/11/2016  . Tinea pedis of right foot 11/11/2016  . Numbness 11/11/2016  . Positive H. pylori test 07/23/2016  . Chronic nasal congestion 01/18/2015    Scot Jun, PT, DPT, OCS, ATC 09/20/20  8:37 AM    [facility] Physical Therapy [address] [place], [place], [address] Phone: [phone]   Fax:  [phone]  Name: Andrew Sandoval MRN: [id number] Date of Birth: [birth year]

## 2020-09-20 NOTE — Progress Notes (Signed)
Office Visit Note   Patient: Andrew Sandoval           Date of Birth: 06/05/1981           MRN: [id number] Visit Date: 09/20/2020 Requested by: Hoy Register, MD [address] [place],  [place] [address] PCP: Hoy Register, MD  Subjective: Chief Complaint  Patient presents with  . Right Shoulder - Follow-up    Still going to PT for the shoulder -- feeling "better."  . Other    Numbness in the fingers of both hands, with pain in the 1st metacarpal. Has had carpal tunnel injections in the past, which have helped. Requests injections today.    HPI: He is here for follow-up bilateral shoulder pain.  Interpreter is present today.  Both shoulders have improved with physical therapy.  He has recurrent lateral carpal tunnel syndrome.  His last injections were about 6 or 7 months ago and they worked until just recently.  He is requesting injections again.  He is also having some pain in the right hand index MCP joint which is a little bit swollen.                ROS:   All other systems were reviewed and are negative.  Objective: Vital Signs: There were no vitals taken for this visit.  Physical Exam:  General:  Alert and oriented, in no acute distress. Pulm:  Breathing unlabored. Psy:  Normal mood, congruent affect. Skin: No erythema Hands: He has no thenar atrophy, good intrinsic strength.  Positive Tinel's at the carpal tunnel bilaterally and positive Phalen's test.  Right hand index MCP has a slight effusion.    Imaging: No results found.  Assessment & Plan: 1.  Improved bilateral shoulder pain  2.  Bilateral carpal tunnel syndrome -Injections given today.  Follow-up as needed.  3.  Right hand index MCP pain, possible early arthritis. -Trial of glucosamine and turmeric.     Procedures: Bilateral wrist injections: After sterile prep with Betadine, injected 1 cc 0.25% bupivacaine without epinephrine and 40 mg Depo-Medrol into the carpal tunnel of each  wrist.       PMFS History: Patient Active Problem List   Diagnosis Date Noted  . Bilateral carpal tunnel syndrome 02/02/2020  . Chronic pruritic rash in adult 10/21/2019  . Pain in both hands 08/18/2019  . Acute pain of right shoulder 08/18/2019  . Hyperlipidemia 06/09/2017  . Enlarged tonsils 11/11/2016  . Tinea pedis of right foot 11/11/2016  . Numbness 11/11/2016  . Positive H. pylori test 07/23/2016  . Chronic nasal congestion 01/18/2015   Past Medical History:  Diagnosis Date  . Cocaine use 2006-2010    Family History  Problem Relation Age of Onset  . Diabetes Neg Hx     Past Surgical History:  Procedure Laterality Date  . No prior surgery     Social History   Occupational History    Comment: [employer]  Tobacco Use  . Smoking status: Former Games developer  . Smokeless tobacco: Never Used  . Tobacco comment: quit 12 years ago  Vaping Use  . Vaping Use: Never used  Substance and Sexual Activity  . Alcohol use: No    Alcohol/week: 0.0 standard drinks    Comment: quit 12 years ago  . Drug use: Yes    Types: Cocaine, Marijuana    Comment: Prior cocaine; none in 11 years  . Sexual activity: Yes

## 2020-09-25 ENCOUNTER — Encounter: Payer: Self-pay | Admitting: Rehabilitative and Restorative Service Providers"

## 2020-09-27 ENCOUNTER — Encounter: Payer: Self-pay | Admitting: Rehabilitative and Restorative Service Providers"

## 2020-09-27 ENCOUNTER — Other Ambulatory Visit: Payer: Self-pay

## 2020-09-27 ENCOUNTER — Ambulatory Visit (INDEPENDENT_AMBULATORY_CARE_PROVIDER_SITE_OTHER): Payer: Self-pay | Admitting: Rehabilitative and Restorative Service Providers"

## 2020-09-27 DIAGNOSIS — M25512 Pain in left shoulder: Secondary | ICD-10-CM

## 2020-09-27 DIAGNOSIS — M6281 Muscle weakness (generalized): Secondary | ICD-10-CM

## 2020-09-27 DIAGNOSIS — M25511 Pain in right shoulder: Secondary | ICD-10-CM

## 2020-09-27 DIAGNOSIS — R293 Abnormal posture: Secondary | ICD-10-CM

## 2020-09-27 NOTE — Therapy (Signed)
[facility] Physical Therapy [address] [place], [place], [address] Phone: [phone]   Fax:  [phone]  Physical Therapy Treatment/Discharge  Patient Details  Name: Andrew Sandoval MRN: [id number] Date of Birth: [birth year] Referring Provider (PT): Dr. Junius Roads   Encounter Date: 09/27/2020   PHYSICAL THERAPY DISCHARGE SUMMARY  Visits from Start of Care: 10  Current functional level related to goals / functional outcomes: See note   Remaining deficits: See note   Education / Equipment: HEP Plan: Patient agrees to discharge.  Patient goals were met. Patient is being discharged due to being pleased with the current functional level.  ?????        PT End of Session - 09/27/20 0840    Visit Number 10    Number of Visits 12    Date for PT Re-Evaluation 10/11/20    Authorization Type CAFA 10/10/2020    Progress Note Due on Visit 12    PT Start Time 0846    PT Stop Time 0900    PT Time Calculation (min) 14 min    Activity Tolerance Patient tolerated treatment well    Behavior During Therapy [facility] for tasks assessed/performed           Past Medical History:  Diagnosis Date  . Cocaine use 2006-2010    Past Surgical History:  Procedure Laterality Date  . No prior surgery      There were no vitals filed for this visit.   Subjective Assessment - 09/27/20 0902    Subjective Pt. indicated injection in hand helped.  Pt. stated doing much better overall.  Indicated he had to leave early today due to emergency elsewhere.    Patient is accompained by: Interpreter    Pertinent History History of bilateral carpal tunnel syndrome.    Limitations House hold activities;Lifting;Other (comment)    Patient Stated Goals Reduce pain, use arm better    Currently in Pain? No/denies    Pain Onset More than a month ago              [facility] PT Assessment - 09/27/20 0001      Assessment   Medical Diagnosis Bilateral shoulder pain    Referring Provider  (PT) Dr. Junius Roads    Onset Date/Surgical Date 07/11/20    Hand Dominance Right      Strength   Right Shoulder Flexion 5/5    Right Shoulder ABduction 5/5    Right Shoulder Internal Rotation 5/5    Right Shoulder External Rotation 5/5    Left Shoulder Flexion 5/5    Left Shoulder ABduction 5/5    Left Shoulder Internal Rotation 5/5    Left Shoulder External Rotation 5/5                         [facility] Adult PT Treatment/Exercise - 09/27/20 0001      Self-Care   Self-Care Other Self-Care Comments    Other Self-Care Comments  HEP review and cues/tips for home use for self management.  Postural cues continued overall for improvd positioning.  Return to care options if symptoms returned in future (MD visit, referral for PT)                  PT Education - 09/27/20 0902    Education Details Review of HEP for d/c.  Education on self care for use going forward, stretching for symptom relief.  Return to MD/PT procedures if symptoms returned.    Person(s)  Educated Patient    Methods Explanation    Comprehension Verbalized understanding            PT Short Term Goals - 09/11/20 0820      PT SHORT TERM GOAL #1   Title Patient will demonstrate independent use of home exercise program to maintain progress from in clinic treatments.    Status Achieved             PT Long Term Goals - 09/27/20 0160      PT LONG TERM GOAL #1   Title Patient will demonstrate/report pain at worst less than or equal to 2/10 to facilitate minimal limitation in daily activity secondary to pain symptoms.    Time 8    Status Achieved      PT LONG TERM GOAL #2   Title Patient will demonstrate independent use of home exercise program to facilitate ability to maintain/progress functional gains from skilled physical therapy services.    Time 8    Period Weeks    Status Achieved      PT LONG TERM GOAL #3   Title Patient will demonstrate bilateral [place] joint mobility WFL to facilitate  usual self care, dressing, reaching overhead at PLOF s limitation due to symptoms.    Time 8    Period Weeks    Status Achieved      PT LONG TERM GOAL #4   Title Patient will demonstrate bilateral UE MMT 5/5 throughout to facilitate usual lifting, carrying in functional activity to PLOF s limitation.    Time 8    Period Weeks    Status Achieved      PT LONG TERM GOAL #5   Title Pt. will demonstrate FOTO outcome > or = 73    Time 8    Period Weeks    Status Partially Met                 Plan - 09/27/20 0904    Clinical Impression Statement Pt. has reached most of established goals and reported good improvement in symptom level and return to activity.  Recommend d/c to HEP at this time, Pt. in agreement.    Personal Factors and Comorbidities Other    Examination-Activity Limitations Sleep;Carry;Dressing;Lift;Reach Overhead    Examination-Participation Restrictions Occupation;Meal Prep;Community Activity    Stability/Clinical Decision Making Stable/Uncomplicated    PT Treatment/Interventions ADLs/Self Care Home Management;Electrical Stimulation;Cryotherapy;Iontophoresis 79m/ml Dexamethasone;Moist Heat;Traction;Balance training;Therapeutic exercise;Therapeutic activities;Functional mobility training;Stair training;Gait training;DME Instruction;Ultrasound;Neuromuscular re-education;Passive range of motion;Spinal Manipulations;Joint Manipulations;Dry needling;Patient/family education;Taping;Manual techniques    PT Next Visit Plan D/C to HEP    PT [facility] Exercise Plan [facility]   Consulted and Agree with Plan of Care Patient           Patient will benefit from skilled therapeutic intervention in order to improve the following deficits and impairments:  Decreased endurance,Pain,Impaired UE functional use,Increased fascial restricitons,Decreased strength,Decreased activity tolerance,Decreased mobility,Decreased range of motion,Improper body mechanics,Postural dysfunction,Impaired  flexibility,Decreased coordination  Visit Diagnosis: Acute pain of right shoulder  Acute pain of left shoulder  Muscle weakness (generalized)  Abnormal posture     Problem List Patient Active Problem List   Diagnosis Date Noted  . Bilateral carpal tunnel syndrome 02/02/2020  . Chronic pruritic rash in adult 10/21/2019  . Pain in both hands 08/18/2019  . Acute pain of right shoulder 08/18/2019  . Hyperlipidemia 06/09/2017  . Enlarged tonsils 11/11/2016  . Tinea pedis of right foot 11/11/2016  . Numbness 11/11/2016  . Positive H. pylori  test 07/23/2016  . Chronic nasal congestion 01/18/2015   Scot Jun, PT, DPT, OCS, ATC 09/27/20  9:07 AM    [facility] Physical Therapy [address] [place], [place], [address] Phone: [phone]   Fax:  [phone]  Name: Andrew Sandoval MRN: [id number] Date of Birth: [birth year]

## 2020-10-18 ENCOUNTER — Other Ambulatory Visit: Payer: Self-pay

## 2020-10-18 MED FILL — Atorvastatin Calcium Tab 40 MG (Base Equivalent): ORAL | 30 days supply | Qty: 30 | Fill #1 | Status: AC

## 2020-11-16 ENCOUNTER — Other Ambulatory Visit: Payer: Self-pay

## 2020-11-16 MED FILL — Atorvastatin Calcium Tab 40 MG (Base Equivalent): ORAL | 30 days supply | Qty: 30 | Fill #2 | Status: CN

## 2020-11-16 MED FILL — Atorvastatin Calcium Tab 40 MG (Base Equivalent): ORAL | 30 days supply | Qty: 30 | Fill #2 | Status: AC

## 2020-11-22 ENCOUNTER — Other Ambulatory Visit: Payer: Self-pay

## 2020-11-22 ENCOUNTER — Encounter: Payer: Self-pay | Admitting: Family Medicine

## 2020-11-22 ENCOUNTER — Ambulatory Visit: Payer: Self-pay | Attending: Family Medicine | Admitting: Family Medicine

## 2020-11-22 VITALS — BP 116/74 | HR 60 | Ht 62.0 in | Wt 170.2 lb

## 2020-11-22 DIAGNOSIS — E7800 Pure hypercholesterolemia, unspecified: Secondary | ICD-10-CM

## 2020-11-22 DIAGNOSIS — L309 Dermatitis, unspecified: Secondary | ICD-10-CM

## 2020-11-22 DIAGNOSIS — H6121 Impacted cerumen, right ear: Secondary | ICD-10-CM

## 2020-11-22 MED ORDER — DEBROX 6.5 % OT SOLN
5.0000 [drp] | Freq: Two times a day (BID) | OTIC | 1 refills | Status: DC
  Filled 2020-11-22: qty 15, 30d supply, fill #0

## 2020-11-22 MED ORDER — HALOBETASOL PROPIONATE 0.05 % EX CREA
TOPICAL_CREAM | Freq: Two times a day (BID) | CUTANEOUS | 2 refills | Status: AC
  Filled 2020-11-22 – 2021-08-01 (×2): qty 50, 20d supply, fill #0
  Filled 2021-10-26: qty 50, 20d supply, fill #1

## 2020-11-22 MED ORDER — ATORVASTATIN CALCIUM 40 MG PO TABS
ORAL_TABLET | Freq: Every day | ORAL | 6 refills | Status: DC
  Filled 2020-11-22: qty 30, fill #0
  Filled 2020-12-13: qty 30, 30d supply, fill #0
  Filled 2021-01-09 (×2): qty 30, 30d supply, fill #1
  Filled 2021-02-21: qty 30, 30d supply, fill #2
  Filled 2021-03-20: qty 30, 30d supply, fill #3
  Filled 2021-05-02: qty 30, 30d supply, fill #4
  Filled 2021-05-28: qty 30, 30d supply, fill #5

## 2020-11-22 NOTE — Progress Notes (Signed)
Subjective:  Patient ID: Andrew Sandoval, male    DOB: Nov 07, 1980  Age: 40 y.o. MRN: [id number]  CC: Otalgia   HPI Andrew Sandoval  is a 40 year old male with a history of hyperlipidemia who presents today for follow-up.  Interval History: Complains of R ear pain for the last 2 months but denies hearing loss. He has a buzzing sound in his R ear. Denies presence of sinus pressure or nasal congestion. Compliant with his statin and is fasting in anticipation of labs. He has a chronic right foot rash for which he has been using topical steroids and is requesting a refill today.  Past Medical History:  Diagnosis Date   Cocaine use 2006-2010   Hyperlipidemia 06/09/2017    Past Surgical History:  Procedure Laterality Date   No prior surgery      Family History  Problem Relation Age of Onset   Diabetes Neg Hx     Allergies  Allergen Reactions   Tylenol [Acetaminophen] Hives    Outpatient Medications Prior to Visit  Medication Sig Dispense Refill   fluticasone (FLONASE) 50 MCG/ACT nasal spray PLACE 2 SPRAYS INTO BOTH NOSTRILS DAILY. 16 g 1   gabapentin (NEURONTIN) 300 MG capsule TAKE 1 CAPSULE (300 MG TOTAL) BY MOUTH AT BEDTIME. 30 capsule 1   naproxen (NAPROSYN) 500 MG tablet TAKE 1 TABLET (500 MG TOTAL) BY MOUTH 2 (TWO) TIMES DAILY WITH A MEAL. 60 tablet 3   atorvastatin (LIPITOR) 40 MG tablet TAKE 1 TABLET (40 MG TOTAL) BY MOUTH DAILY. 30 tablet 6   halobetasol (ULTRAVATE) 0.05 % cream APPLY TOPICALLY 2 (TWO) TIMES DAILY. 50 g 0   No facility-administered medications prior to visit.     ROS Review of Systems  Constitutional:  Negative for activity change and appetite change.  HENT:  Positive for ear pain and tinnitus. Negative for sinus pressure and sore throat.   Eyes:  Negative for visual disturbance.  Respiratory:  Negative for cough, chest tightness and shortness of breath.   Cardiovascular:  Negative for chest pain and leg swelling.  Gastrointestinal:   Negative for abdominal distention, abdominal pain, constipation and diarrhea.  Endocrine: Negative.   Genitourinary:  Negative for dysuria.  Musculoskeletal:  Negative for joint swelling and myalgias.  Skin:  Negative for rash.  Allergic/Immunologic: Negative.   Neurological:  Negative for weakness, light-headedness and numbness.  Psychiatric/Behavioral:  Negative for dysphoric mood and suicidal ideas.    Objective:  BP 116/74   Pulse 60   Ht '5\' 2"'  (1.575 m)   Wt 170 lb 3.2 oz (77.2 kg)   SpO2 98%   BMI 31.13 kg/m   BP/Weight 11/22/2020 08/01/2020 12/04/9483  Systolic BP 462 703 500  Diastolic BP 74 75 78  Wt. (Lbs) 170.2 166 -  BMI 31.13 30.36 -      Physical Exam Constitutional:      Appearance: He is well-developed.  HENT:     Right Ear: There is impacted cerumen.     Left Ear: There is no impacted cerumen.  Neck:     Vascular: No JVD.  Cardiovascular:     Rate and Rhythm: Normal rate.     Heart sounds: Normal heart sounds. No murmur heard. Pulmonary:     Effort: Pulmonary effort is normal.     Breath sounds: Normal breath sounds. No wheezing or rales.  Chest:     Chest wall: No tenderness.  Abdominal:     General: Bowel sounds are normal. There is  no distension.     Palpations: Abdomen is soft. There is no mass.     Tenderness: There is no abdominal tenderness.  Musculoskeletal:        General: Normal range of motion.     Right lower leg: No edema.     Left lower leg: No edema.  Skin:    Comments: Dorsum of R foot with hyperpigmented patch  Neurological:     Mental Status: He is alert and oriented to person, place, and time.  Psychiatric:        Mood and Affect: Mood normal.    CMP Latest Ref Rng & Units 07/04/2020 12/08/2019 04/14/2019  Glucose 70 - 99 mg/dL 97 95 95  BUN 6 - 20 mg/dL '11 12 14  ' Creatinine 0.61 - 1.24 mg/dL 0.91 0.92 0.94  Sodium 135 - 145 mmol/L 138 140 140  Potassium 3.5 - 5.1 mmol/L 3.7 4.1 4.5  Chloride 98 - 111 mmol/L 107 105 104   CO2 22 - 32 mmol/L 20(L) 22 22  Calcium 8.9 - 10.3 mg/dL 8.9 9.3 9.4  Total Protein 6.0 - 8.5 g/dL - 7.1 7.4  Total Bilirubin 0.0 - 1.2 mg/dL - 0.7 0.6  Alkaline Phos 48 - 121 IU/L - 125(H) 111  AST 0 - 40 IU/L - 31 26  ALT 0 - 44 IU/L - 62(H) 54(H)    Lipid Panel     Component Value Date/Time   CHOL 125 02/02/2020 0907   TRIG 115 02/02/2020 0907   HDL 41 02/02/2020 0907   CHOLHDL 3.0 02/02/2020 0907   LDLCALC 63 02/02/2020 0907    CBC    Component Value Date/Time   WBC 5.2 07/04/2020 0905   RBC 5.48 07/04/2020 0905   HGB 16.9 07/04/2020 0905   HGB 16.3 06/06/2017 1157   HCT 50.3 07/04/2020 0905   HCT 48.5 06/06/2017 1157   PLT 283 07/04/2020 0905   PLT 303 06/06/2017 1157   MCV 91.8 07/04/2020 0905   MCV 91 06/06/2017 1157   MCH 30.8 07/04/2020 0905   MCHC 33.6 07/04/2020 0905   RDW 12.7 07/04/2020 0905   RDW 13.6 06/06/2017 1157   LYMPHSABS 2.1 06/06/2017 1157   MONOABS 0.4 04/18/2009 1420   EOSABS 0.1 06/06/2017 1157   BASOSABS 0.0 06/06/2017 1157    Lab Results  Component Value Date   HGBA1C 5.4 09/25/2016    Assessment & Plan:  1. Pure hypercholesterolemia Controlled Low-cholesterol diet We will check lipid panel today - atorvastatin (LIPITOR) 40 MG tablet; TAKE 1 TABLET (40 MG TOTAL) BY MOUTH DAILY.  Dispense: 30 tablet; Refill: 6 - CMP14+EGFR - Lipid panel  2. Impacted cerumen of right ear This could explain his right ear pain and tinnitus Will treat with Debrox and if symptoms persist consider ear irrigation - carbamide peroxide (DEBROX) 6.5 % OTIC solution; Place 5 drops into the right ear 2 (two) times daily.  Dispense: 15 mL; Refill: 1  3. Dermatitis Stable - halobetasol (ULTRAVATE) 0.05 % cream; APPLY TOPICALLY 2 (TWO) TIMES DAILY.  Dispense: 50 g; Refill: 2    Meds ordered this encounter  Medications   atorvastatin (LIPITOR) 40 MG tablet    Sig: TAKE 1 TABLET (40 MG TOTAL) BY MOUTH DAILY.    Dispense:  30 tablet    Refill:  6    carbamide peroxide (DEBROX) 6.5 % OTIC solution    Sig: Place 5 drops into the right ear 2 (two) times daily.    Dispense:  15  mL    Refill:  1   halobetasol (ULTRAVATE) 0.05 % cream    Sig: APPLY TOPICALLY 2 (TWO) TIMES DAILY.    Dispense:  50 g    Refill:  2    Follow-up: Return in about 6 months (around 05/24/2021) for Hypercholesterolemia.       Charlott Rakes, MD, FAAFP. [facility] [place], [place]   11/22/2020, 9:36 AM

## 2020-11-22 NOTE — Progress Notes (Signed)
Having pain in right ear. Wants to get Lipid panel today.

## 2020-11-22 NOTE — Patient Instructions (Signed)
Acumulación de cera en el oído en adultos °Earwax Buildup, Adult °Los oídos producen una sustancia llamada cera que ayuda a evitar que ingresen bacterias en el oído y protege la piel del canal auditivo. En ocasiones, la cera se puede acumular en el oído y causar molestias o pérdida de la audición. °¿Cuáles son las causas? °Esta afección es causada por una acumulación de cera. Los canales auditivos se limpian por sí solos. La cera de los oídos se produce en la parte externa del canal auditivo y generalmente cae hacia afuera en pequeñas cantidades con el tiempo. °Cuando el mecanismo de autolimpieza no funciona, la cera se acumula y eso puede reducir la audición y causar molestias. Tratar de limpiar los oídos con hisopos puede empujar la cera hacia el interior del canal auditivo y causar dolor y disminución de la audición. °¿Qué incrementa el riesgo? °Es más probable que esta afección se manifieste en las personas que presentan alguna de estas características: °Se limpian frecuentemente los oídos con hisopos. °Se escarban los oídos. °Utilizan tapones para los oídos o auriculares internos con frecuencia, o usan audífonos. °Los siguientes factores también pueden hacer que usted sea más propenso a desarrollar esta afección: °Ser hombre. °Ser una persona de edad avanzada. °Producir más cera de forma natural. °Tener los canales auditivos estrechos. °Tener cera que es demasiado densa o pegajosa. °Tener vello excesivo en el canal auditivo. °Tener eccema. °Estar deshidratado. °¿Cuáles son los signos o síntomas? °Los síntomas de esta afección incluyen: °Disminución de la audición. °Sensación de que el oído está lleno u obstruido. °Secreción de líquido. °Dolor o picor en el oído. °Zumbidos en el oído. °Tos. °Trastornos del equilibrio. °Porción de cera que se puede observar en el interior del canal auditivo. °¿Cómo se diagnostica? °Esta afección se puede diagnosticar en función de lo siguiente: °Sus síntomas. °Sus antecedentes  médicos. °Un examen de oído. Durante el examen, el médico mirará dentro de su oído con un instrumento llamado otoscopio. °Pueden hacerle otros estudios, como una prueba de audición. °¿Cómo se trata? °El tratamiento para esta afección puede incluir lo siguiente: °Gotas óticas para ablandar la cera. °Extracción de cera realizada por un médico. El médico también podrá hacer lo siguiente: °Enjuagar el oído con agua. °Usar un instrumento con un anillo en el extremo (cureta). °Usar un dispositivo de succión. °Realizar una cirugía para extraer la acumulación de cera. Esto puede realizarse en casos graves. °Siga estas instrucciones en su casa: ° °Use los medicamentos de venta libre y los recetados solamente como se lo haya indicado el médico. °No se introduzca ningún objeto en el oído, ni siquiera hisopos de algodón. La abertura del canal auditivo se puede limpiar con un paño o pañuelo facial. °Siga las instrucciones del médico acerca de cómo limpiarse los oídos. No se limpie los oídos en exceso. °Beber suficiente líquido como para mantener la orina de color amarillo pálido. Esto ayudará a diluir la cera. °Concurra a todas las visitas de seguimiento como se lo hayan indicado. Si tiene acumulación de cera en el oído con frecuencia o usa audífonos, visite a su médico para que le realice una limpieza de oído de rutina y preventiva. Pregúntele al médico con qué frecuencia debe programar estas limpiezas. °Si tiene audífonos, límpielos según las instrucciones del fabricante y de su médico. °Comuníquese con un médico si: °Tiene dolor de oído. °Presenta fiebre. °Le sale pus u otro líquido del oído. °Tiene pérdida de la audición. °Tiene zumbidos en el oído que no desaparecen. °Tiene la sensación de que la   habitación da vueltas (vértigo). °Los síntomas no mejoran con el tratamiento. °Solicite ayuda de inmediato si: °Le sangra el oído afectado. °Tiene dolor intenso de oído. °Resumen °La cera se puede acumular en el oído y causar  molestias o pérdida de la audición. °Los síntomas más comunes de esta afección incluyen una audición reducida o atenuada y la sensación de que el oído está lleno o está obstruido. °Esta afección se puede diagnosticar en función de los síntomas, sus antecedentes médicos y un examen de oído. °Esta afección se puede tratar mediante gotas óticas que ablandan la cera o mediante la extracción de la cera que realiza el médico. °No se introduzca ningún objeto en el oído, ni siquiera hisopos de algodón. La abertura del canal auditivo se puede limpiar con un paño o pañuelo facial. °Esta información no tiene como fin reemplazar el consejo del médico. Asegúrese de hacerle al médico cualquier pregunta que tenga. °Document Revised: 11/25/2019 Document Reviewed: 11/25/2019 °Elsevier Patient Education © 2022 [employer] Inc. ° °

## 2020-11-23 ENCOUNTER — Other Ambulatory Visit: Payer: Self-pay

## 2020-11-23 LAB — CMP14+EGFR
ALT: 60 IU/L — ABNORMAL HIGH (ref 0–44)
AST: 30 IU/L (ref 0–40)
Albumin/Globulin Ratio: 1.8 (ref 1.2–2.2)
Albumin: 4.6 g/dL (ref 4.0–5.0)
Alkaline Phosphatase: 120 IU/L (ref 44–121)
BUN/Creatinine Ratio: 15 (ref 9–20)
BUN: 15 mg/dL (ref 6–24)
Bilirubin Total: 0.6 mg/dL (ref 0.0–1.2)
CO2: 23 mmol/L (ref 20–29)
Calcium: 9.4 mg/dL (ref 8.7–10.2)
Chloride: 105 mmol/L (ref 96–106)
Creatinine, Ser: 0.97 mg/dL (ref 0.76–1.27)
Globulin, Total: 2.6 g/dL (ref 1.5–4.5)
Glucose: 97 mg/dL (ref 65–99)
Potassium: 4.4 mmol/L (ref 3.5–5.2)
Sodium: 140 mmol/L (ref 134–144)
Total Protein: 7.2 g/dL (ref 6.0–8.5)
eGFR: 101 mL/min/{1.73_m2} (ref >59)

## 2020-11-23 LAB — LIPID PANEL
Chol/HDL Ratio: 4.5 ratio (ref 0.0–5.0)
Cholesterol, Total: 162 mg/dL (ref 100–199)
HDL: 36 mg/dL — ABNORMAL LOW (ref >39)
LDL Chol Calc (NIH): 98 mg/dL (ref 0–99)
Triglycerides: 158 mg/dL — ABNORMAL HIGH (ref 0–149)
VLDL Cholesterol Cal: 28 mg/dL (ref 5–40)

## 2020-11-24 ENCOUNTER — Encounter (INDEPENDENT_AMBULATORY_CARE_PROVIDER_SITE_OTHER): Payer: Self-pay

## 2020-11-24 ENCOUNTER — Other Ambulatory Visit: Payer: Self-pay

## 2020-12-06 ENCOUNTER — Other Ambulatory Visit: Payer: Self-pay

## 2020-12-06 ENCOUNTER — Ambulatory Visit: Payer: Self-pay | Attending: Family Medicine

## 2020-12-12 ENCOUNTER — Ambulatory Visit (HOSPITAL_COMMUNITY)
Admission: EM | Admit: 2020-12-12 | Discharge: 2020-12-12 | Disposition: A | Discharge status: Home / Self Care | Payer: Self-pay | Attending: Urgent Care | Admitting: Urgent Care

## 2020-12-12 ENCOUNTER — Other Ambulatory Visit: Payer: Self-pay

## 2020-12-12 ENCOUNTER — Encounter (HOSPITAL_COMMUNITY): Payer: Self-pay | Admitting: Emergency Medicine

## 2020-12-12 DIAGNOSIS — J3489 Other specified disorders of nose and nasal sinuses: Secondary | ICD-10-CM | POA: Insufficient documentation

## 2020-12-12 DIAGNOSIS — J310 Chronic rhinitis: Secondary | ICD-10-CM | POA: Insufficient documentation

## 2020-12-12 DIAGNOSIS — Z79899 Other long term (current) drug therapy: Secondary | ICD-10-CM | POA: Insufficient documentation

## 2020-12-12 DIAGNOSIS — Z886 Allergy status to analgesic agent status: Secondary | ICD-10-CM | POA: Insufficient documentation

## 2020-12-12 DIAGNOSIS — J069 Acute upper respiratory infection, unspecified: Secondary | ICD-10-CM | POA: Insufficient documentation

## 2020-12-12 DIAGNOSIS — Z87891 Personal history of nicotine dependence: Secondary | ICD-10-CM | POA: Insufficient documentation

## 2020-12-12 DIAGNOSIS — Z20822 Contact with and (suspected) exposure to covid-19: Secondary | ICD-10-CM | POA: Insufficient documentation

## 2020-12-12 MED ORDER — CETIRIZINE HCL 10 MG PO TABS
10.0000 mg | ORAL_TABLET | Freq: Every day | ORAL | 0 refills | Status: DC
  Filled 2020-12-12: qty 30, 30d supply, fill #0

## 2020-12-12 MED ORDER — BENZONATATE 100 MG PO CAPS
100.0000 mg | ORAL_CAPSULE | Freq: Three times a day (TID) | ORAL | 0 refills | Status: DC | PRN
  Filled 2020-12-12: qty 60, 10d supply, fill #0

## 2020-12-12 MED ORDER — FLUTICASONE PROPIONATE 50 MCG/ACT NA SUSP
2.0000 | Freq: Every day | NASAL | 12 refills | Status: DC
  Filled 2020-12-12: qty 16, 30d supply, fill #0
  Filled 2021-01-24 – 2021-02-01 (×2): qty 16, 30d supply, fill #1
  Filled 2021-03-20: qty 16, 30d supply, fill #2
  Filled 2021-07-18: qty 16, 30d supply, fill #0

## 2020-12-12 MED ORDER — PSEUDOEPHEDRINE HCL 60 MG PO TABS
60.0000 mg | ORAL_TABLET | Freq: Three times a day (TID) | ORAL | 0 refills | Status: DC | PRN
  Filled 2020-12-12: qty 30, 10d supply, fill #0

## 2020-12-12 MED ORDER — PROMETHAZINE-DM 6.25-15 MG/5ML PO SYRP
5.0000 mL | ORAL_SOLUTION | Freq: Every evening | ORAL | 0 refills | Status: DC | PRN
  Filled 2020-12-12: qty 100, 20d supply, fill #0

## 2020-12-12 NOTE — ED Provider Notes (Signed)
[facility]   MRN: [id number] DOB: 31-Dec-1980  Subjective:   Andrew Sandoval is a 40 y.o. male presenting for 4-day history of acute onset sinus congestion, runny nose, cough, malaise and fatigue.  Patient denies any history of respiratory disorders, allergic rhinitis.  He is COVID vaccinated and has the booster as well.  Denies smoking cigarettes.  Denies fever, headache, confusion, ear pain, throat pain, chest pain, shortness of breath, body aches.  No current facility-administered medications for this encounter.  Current Outpatient Medications:    atorvastatin (LIPITOR) 40 MG tablet, TAKE 1 TABLET (40 MG TOTAL) BY MOUTH DAILY., Disp: 30 tablet, Rfl: 6   carbamide peroxide (DEBROX) 6.5 % OTIC solution, Place 5 drops into the right ear 2 (two) times daily., Disp: 15 mL, Rfl: 1   fluticasone (FLONASE) 50 MCG/ACT nasal spray, PLACE 2 SPRAYS INTO BOTH NOSTRILS DAILY., Disp: 16 g, Rfl: 1   gabapentin (NEURONTIN) 300 MG capsule, TAKE 1 CAPSULE (300 MG TOTAL) BY MOUTH AT BEDTIME. (Patient not taking: Reported on 12/12/2020), Disp: 30 capsule, Rfl: 1   halobetasol (ULTRAVATE) 0.05 % cream, APPLY TOPICALLY 2 (TWO) TIMES DAILY., Disp: 50 g, Rfl: 2   naproxen (NAPROSYN) 500 MG tablet, TAKE 1 TABLET (500 MG TOTAL) BY MOUTH 2 (TWO) TIMES DAILY WITH A MEAL. (Patient not taking: Reported on 12/12/2020), Disp: 60 tablet, Rfl: 3   Allergies  Allergen Reactions   Tylenol [Acetaminophen] Hives    Past Medical History:  Diagnosis Date   Cocaine use 2006-2010   Hyperlipidemia 06/09/2017     Past Surgical History:  Procedure Laterality Date   No prior surgery      Family History  Problem Relation Age of Onset   Diabetes Neg Hx     Social History   Tobacco Use   Smoking status: Former    Pack years: 0.00   Smokeless tobacco: Never   Tobacco comments:    quit 12 years ago  Vaping Use   Vaping Use: Never used  Substance Use Topics   Alcohol use: No    Alcohol/week: 0.0  standard drinks    Comment: quit 12 years ago   Drug use: Not Currently    Types: Cocaine, Marijuana    Comment: Prior cocaine; none in 11 years    ROS   Objective:   Vitals: BP 138/83 (BP Location: Right Arm)   Pulse 78   Temp 98.8 F (37.1 C) (Oral)   Resp 18   SpO2 96%   Physical Exam Constitutional:      General: He is not in acute distress.    Appearance: Normal appearance. He is well-developed and normal weight. He is not ill-appearing, toxic-appearing or diaphoretic.  HENT:     Head: Normocephalic and atraumatic.     Right Ear: Tympanic membrane, ear canal and external ear normal. There is no impacted cerumen.     Left Ear: Tympanic membrane, ear canal and external ear normal. There is no impacted cerumen.     Nose: Congestion and rhinorrhea present.     Mouth/Throat:     Mouth: Mucous membranes are moist.     Pharynx: No oropharyngeal exudate or posterior oropharyngeal erythema.     Comments: Thick streaks of post-nasal drainage overlying pharynx.  Eyes:     General: No scleral icterus.       Right eye: No discharge.        Left eye: No discharge.     Extraocular Movements: Extraocular movements  intact.     Conjunctiva/sclera: Conjunctivae normal.     Pupils: Pupils are equal, round, and reactive to light.  Cardiovascular:     Rate and Rhythm: Normal rate and regular rhythm.     Heart sounds: Normal heart sounds. No murmur heard.   No friction rub. No gallop.  Pulmonary:     Effort: Pulmonary effort is normal. No respiratory distress.     Breath sounds: Normal breath sounds. No stridor. No wheezing, rhonchi or rales.  Musculoskeletal:     Cervical back: Normal range of motion and neck supple. No rigidity. No muscular tenderness.  Neurological:     General: No focal deficit present.     Mental Status: He is alert and oriented to person, place, and time.  Psychiatric:        Mood and Affect: Mood normal.        Behavior: Behavior normal.        Thought  Content: Thought content normal.      Assessment and Plan :   PDMP not reviewed this encounter.  1. Viral URI with cough   2. Stuffy and runny nose     Will manage for viral illness such as viral URI, viral syndrome, viral rhinitis, COVID-19. Counseled patient on nature of COVID-19 including modes of transmission, diagnostic testing, management and supportive care.  Offered scripts for symptomatic relief. COVID 19 testing is pending. Counseled patient on potential for adverse effects with medications prescribed/recommended today, ER and return-to-clinic precautions discussed, patient verbalized understanding.     Wallis Bamberg, [place] 12/12/20 1829

## 2020-12-12 NOTE — ED Triage Notes (Signed)
Cough and mucous for 4 days, some fever.  Cough is interrupting sleep at night

## 2020-12-13 ENCOUNTER — Other Ambulatory Visit: Payer: Self-pay

## 2020-12-13 LAB — SARS CORONAVIRUS 2 (TAT 6-24 HRS): SARS Coronavirus 2: NEGATIVE

## 2021-01-09 ENCOUNTER — Other Ambulatory Visit: Payer: Self-pay

## 2021-01-09 MED FILL — Gabapentin Cap 300 MG: ORAL | 30 days supply | Qty: 30 | Fill #0 | Status: AC

## 2021-01-10 ENCOUNTER — Other Ambulatory Visit: Payer: Self-pay

## 2021-01-24 ENCOUNTER — Other Ambulatory Visit: Payer: Self-pay

## 2021-01-31 ENCOUNTER — Other Ambulatory Visit: Payer: Self-pay

## 2021-02-01 ENCOUNTER — Other Ambulatory Visit: Payer: Self-pay

## 2021-02-02 ENCOUNTER — Other Ambulatory Visit: Payer: Self-pay

## 2021-02-02 MED FILL — Naproxen Tab 500 MG: ORAL | 30 days supply | Qty: 60 | Fill #0 | Status: AC

## 2021-02-14 ENCOUNTER — Ambulatory Visit (INDEPENDENT_AMBULATORY_CARE_PROVIDER_SITE_OTHER): Payer: Self-pay | Admitting: Orthopaedic Surgery

## 2021-02-14 ENCOUNTER — Other Ambulatory Visit: Payer: Self-pay

## 2021-02-14 DIAGNOSIS — G5603 Carpal tunnel syndrome, bilateral upper limbs: Secondary | ICD-10-CM

## 2021-02-14 DIAGNOSIS — M25512 Pain in left shoulder: Secondary | ICD-10-CM

## 2021-02-14 MED ORDER — MELOXICAM 7.5 MG PO TABS
7.5000 mg | ORAL_TABLET | Freq: Two times a day (BID) | ORAL | 2 refills | Status: DC | PRN
  Filled 2021-02-14: qty 60, 30d supply, fill #0

## 2021-02-14 MED ORDER — PREDNISONE 10 MG PO TABS
ORAL_TABLET | ORAL | 0 refills | Status: DC
  Filled 2021-02-14: qty 21, 6d supply, fill #0

## 2021-02-14 NOTE — Progress Notes (Signed)
Office Visit Note   Patient: Andrew Sandoval           Date of Birth: [birth year]           MRN: [id number] Visit Date: 02/14/2021              Requested by: Andrew Register, MD [address] [place],  [place] [address] PCP: Andrew Register, MD   Assessment & Plan: Visit Diagnoses:  1. Acute pain of left shoulder   2. Bilateral carpal tunnel syndrome     Plan: In regards to the shoulder impression is overuse injury.  I recommend relative rest and work restrictions as well as prednisone Dosepak and meloxicam for a week or 2 afterwards.  For the hands and the carpal tunnel syndrome he has had prior nerve conduction studies in 2021 which showed moderate to severe on the right and moderate on the left.  He has had 3 cortisone injections to each hand each with relief for a few months.  I recommended to the patient that it would not be best to continue with these injections especially on the right hand and that he should seriously consider carpal tunnel release to avoid further nerve injury.  Language interpreter present today.  Follow-Up Instructions: No follow-ups on file.   Orders:  No orders of the defined types were placed in this encounter.  Meds ordered this encounter  Medications   predniSONE (DELTASONE) 10 MG tablet    Sig: take 6 tabs by mouth on day 1, then take 5 tabs on day 2, then take 4 tabs on day 3, then take 3 tabs on day 4, then take 2 tabs on day 5, then take 1 tab on day 6. take all with food.    Dispense:  21 tablet    Refill:  0   meloxicam (MOBIC) 7.5 MG tablet    Sig: Take 1 tablet (7.5 mg total) by mouth 2 (two) times daily as needed for pain.    Dispense:  30 tablet    Refill:  2      Procedures: No procedures performed   Clinical Data: No additional findings.   Subjective: Chief Complaint  Patient presents with   Left Shoulder - Pain    Andrew Sandoval comes in today for evaluation of acute left shoulder pain for about 4 days and follow-up of  bilateral carpal tunnel syndrome.  He works as a Public affairs consultant at [employer].  His symptoms are affecting his work.  Denies any neck pain or radicular symptoms.  Currently not taking any medications.   Review of Systems   Objective: Vital Signs: There were no vitals taken for this visit.  Physical Exam  Ortho Exam Left shoulder shows a full range of motion with moderate pain at the extremes.  Rotator cuff strength is relatively well-preserved with moderate pain.  Negative Hawkins sign.  Slight tenderness to the biceps tendon in the bicipital groove.  Moderate pain with Speed test.  Bilateral hand exams are unchanged. Specialty Comments:  No specialty comments available.  Imaging: No results found.   PMFS History: Patient Active Problem List   Diagnosis Date Noted   Bilateral carpal tunnel syndrome 02/02/2020   Chronic pruritic rash in adult 10/21/2019   Pain in both hands 08/18/2019   Acute pain of right shoulder 08/18/2019   Hyperlipidemia 06/09/2017   Enlarged tonsils 11/11/2016   Tinea pedis of right foot 11/11/2016   Numbness 11/11/2016   Positive H. pylori test 07/23/2016  Chronic nasal congestion 01/18/2015   Past Medical History:  Diagnosis Date   Cocaine use 2006-2010   Hyperlipidemia 06/09/2017    Family History  Problem Relation Age of Onset   Diabetes Neg Hx     Past Surgical History:  Procedure Laterality Date   No prior surgery     Social History   Occupational History    Comment: [employer]  Tobacco Use   Smoking status: Former   Smokeless tobacco: Never   Tobacco comments:    quit 12 years ago  Vaping Use   Vaping Use: Never used  Substance and Sexual Activity   Alcohol use: No    Alcohol/week: 0.0 standard drinks    Comment: quit 12 years ago   Drug use: Not Currently    Types: Cocaine, Marijuana    Comment: Prior cocaine; none in 11 years   Sexual activity: Yes

## 2021-02-15 ENCOUNTER — Ambulatory Visit: Payer: Self-pay | Admitting: Orthopaedic Surgery

## 2021-02-21 ENCOUNTER — Other Ambulatory Visit: Payer: Self-pay

## 2021-03-20 ENCOUNTER — Other Ambulatory Visit: Payer: Self-pay

## 2021-03-21 ENCOUNTER — Telehealth: Payer: Self-pay | Admitting: Family Medicine

## 2021-03-21 NOTE — Telephone Encounter (Signed)
Copied from CRM [id number]. Topic: General - Other >> Mar 12, 2021 11:07 AM [name] wrote: Reason for CRM: Pt would like to speak with Andrew Sandoval today about an issue with his orange card / please advise asap

## 2021-03-22 NOTE — Telephone Encounter (Signed)
I returned Pt call, I already spoke with the Pt

## 2021-03-30 NOTE — Telephone Encounter (Signed)
[phone] Pt is calling Carlos back. Please advise

## 2021-05-02 ENCOUNTER — Other Ambulatory Visit: Payer: Self-pay

## 2021-05-23 ENCOUNTER — Encounter: Payer: Self-pay | Admitting: Orthopaedic Surgery

## 2021-05-23 ENCOUNTER — Ambulatory Visit (INDEPENDENT_AMBULATORY_CARE_PROVIDER_SITE_OTHER): Payer: Self-pay | Admitting: Orthopaedic Surgery

## 2021-05-23 ENCOUNTER — Ambulatory Visit: Payer: Self-pay | Admitting: Family Medicine

## 2021-05-23 ENCOUNTER — Other Ambulatory Visit: Payer: Self-pay

## 2021-05-23 DIAGNOSIS — G5602 Carpal tunnel syndrome, left upper limb: Secondary | ICD-10-CM

## 2021-05-23 DIAGNOSIS — G5601 Carpal tunnel syndrome, right upper limb: Secondary | ICD-10-CM

## 2021-05-23 DIAGNOSIS — G8929 Other chronic pain: Secondary | ICD-10-CM

## 2021-05-23 DIAGNOSIS — M25512 Pain in left shoulder: Secondary | ICD-10-CM

## 2021-05-23 DIAGNOSIS — G5603 Carpal tunnel syndrome, bilateral upper limbs: Secondary | ICD-10-CM

## 2021-05-23 MED ORDER — BUPIVACAINE HCL 0.25 % IJ SOLN
2.0000 mL | INTRAMUSCULAR | Status: AC | PRN
  Administered 2021-05-23: 09:00:00 2 mL via INTRA_ARTICULAR

## 2021-05-23 MED ORDER — METHYLPREDNISOLONE ACETATE 40 MG/ML IJ SUSP
40.0000 mg | INTRAMUSCULAR | Status: AC | PRN
  Administered 2021-05-23: 09:00:00 40 mg via INTRA_ARTICULAR

## 2021-05-23 MED ORDER — LIDOCAINE HCL 1 % IJ SOLN
1.0000 mL | INTRAMUSCULAR | Status: AC | PRN
  Administered 2021-05-23: 09:00:00 1 mL

## 2021-05-23 MED ORDER — METHYLPREDNISOLONE ACETATE 40 MG/ML IJ SUSP
1.0000 mg | INTRAMUSCULAR | Status: AC | PRN
  Administered 2021-05-23: 09:00:00 1 mg

## 2021-05-23 MED ORDER — BUPIVACAINE HCL 0.25 % IJ SOLN
1.0000 mL | INTRAMUSCULAR | Status: AC | PRN
  Administered 2021-05-23: 09:00:00 1 mL

## 2021-05-23 MED ORDER — LIDOCAINE HCL 2 % IJ SOLN
2.0000 mL | INTRAMUSCULAR | Status: AC | PRN
  Administered 2021-05-23: 09:00:00 2 mL

## 2021-05-23 NOTE — Progress Notes (Signed)
Office Visit Note   Patient: Andrew Sandoval           Date of Birth: 1980-12-14           MRN: [id number] Visit Date: 05/23/2021              Requested by: Hoy Register, MD [address] [place],  [place] [address] PCP: Hoy Register, MD   Assessment & Plan: Visit Diagnoses:  1. Bilateral carpal tunnel syndrome   2. Chronic left shoulder pain     Plan: Impression is left shoulder subacromial bursitis and rotator cuff tendinitis in addition to bilateral carpal tunnel syndrome moderate to severe on the right and moderate on the left.  In regards to the shoulder, we have discussed subacromial cortisone injection today as well as referral to physical therapy.  He is agreeable to this plan.  In regards to the carpal tunnel syndrome, I have thoroughly discussed that due to the severity and the fact that he has had 3 injections to each hand we recommend surgical intervention at this point.  He understands but would like to proceed with 1 more injection to the right hand while he thinks about surgery.  I have agreed to this plan.  He will follow-up with [place] as needed.  Follow-Up Instructions: Return if symptoms worsen or fail to improve.   Orders:  Orders Placed This Encounter  Procedures   Large Joint Inj: L subacromial bursa   Hand/UE Inj: R carpal tunnel    No orders of the defined types were placed in this encounter.     Procedures: Large Joint Inj: L subacromial bursa on 05/23/2021 8:34 AM Indications: pain Details: 22 G needle Medications: 2 mL lidocaine 2 %; 2 mL bupivacaine 0.25 %; 40 mg methylPREDNISolone acetate 40 MG/ML Outcome: tolerated well, no immediate complications Patient was prepped and draped in the usual sterile fashion.    Hand/UE Inj: R carpal tunnel for carpal tunnel syndrome on 05/23/2021 8:35 AM Indications: pain Details: 25 G needle, volar approach Medications: 1 mL lidocaine 1 %; 1 mL bupivacaine 0.25 %; 1 mg methylPREDNISolone acetate 40  MG/ML     Clinical Data: No additional findings.   Subjective: Chief Complaint  Patient presents with   Right Shoulder - Pain   Right Hand - Pain   Left Hand - Pain    HPI patient is a very pleasant 40 year old Spanish-speaking gentleman who is here today with interpreter.  He is here with recurrent left shoulder pain and bilateral carpal tunnel syndrome.  In regards to the shoulder, he has had pain for the past few months.  He was prescribed a steroid Dosepak when he was here in early September of this year which did help until approximately 3 weeks ago when his symptoms returned.  No new injury but he works as a Public affairs consultant and has had any worsening of symptoms.  His pain is primarily to the deltoid and is constant.  Pain is worse with lifting anything greater than 10 pounds.  He has been taking ibuprofen without significant relief.  He does note paresthesias to both hands.  No previous subacromial cortisone injection to the left shoulder.  In regards to bilateral carpal tunnel syndrome, he has nerve conduction studies show moderate to severe on the right and moderate on the left.  He has had 3 injections in each hand which she states provided approximately 6 to 7 months relief.  He is asking for repeat injection today as he is  not currently interested in surgery.  Review of Systems as detailed in HPI.  All others reviewed and are negative.   Objective: Vital Signs: There were no vitals taken for this visit.  Physical Exam well-developed well-nourished gentleman in no acute distress.  Alert and oriented x3.  Ortho Exam left shoulder exam reveals forward flexion to 110 degrees.  Abduction to 90 degrees.  External rotation 45 degrees.  Internal rotation to L5.  Positive empty can.  Near full strength throughout.  Bilateral hand exam shows a positive Phalen and positive Tinel.  No thenar atrophy.   Specialty Comments:  No specialty comments available.  Imaging: No new imaging   PMFS  History: Patient Active Problem List   Diagnosis Date Noted   Bilateral carpal tunnel syndrome 02/02/2020   Chronic pruritic rash in adult 10/21/2019   Pain in both hands 08/18/2019   Acute pain of right shoulder 08/18/2019   Hyperlipidemia 06/09/2017   Enlarged tonsils 11/11/2016   Tinea pedis of right foot 11/11/2016   Numbness 11/11/2016   Positive H. pylori test 07/23/2016   Chronic nasal congestion 01/18/2015   Past Medical History:  Diagnosis Date   Cocaine use 2006-2010   Hyperlipidemia 06/09/2017    Family History  Problem Relation Age of Onset   Diabetes Neg Hx     Past Surgical History:  Procedure Laterality Date   No prior surgery     Social History   Occupational History    Comment: [employer]  Tobacco Use   Smoking status: Former   Smokeless tobacco: Never   Tobacco comments:    quit 12 years ago  Vaping Use   Vaping Use: Never used  Substance and Sexual Activity   Alcohol use: No    Alcohol/week: 0.0 standard drinks    Comment: quit 12 years ago   Drug use: Not Currently    Types: Cocaine, Marijuana    Comment: Prior cocaine; none in 11 years   Sexual activity: Yes

## 2021-05-24 ENCOUNTER — Ambulatory Visit: Payer: Self-pay | Attending: Family Medicine

## 2021-05-24 DIAGNOSIS — Z23 Encounter for immunization: Secondary | ICD-10-CM

## 2021-05-28 ENCOUNTER — Other Ambulatory Visit: Payer: Self-pay

## 2021-06-05 ENCOUNTER — Encounter: Payer: Self-pay | Admitting: Physical Therapy

## 2021-06-05 ENCOUNTER — Ambulatory Visit (INDEPENDENT_AMBULATORY_CARE_PROVIDER_SITE_OTHER): Payer: Self-pay | Admitting: Physical Therapy

## 2021-06-05 ENCOUNTER — Other Ambulatory Visit: Payer: Self-pay

## 2021-06-05 DIAGNOSIS — M6281 Muscle weakness (generalized): Secondary | ICD-10-CM

## 2021-06-05 DIAGNOSIS — M25512 Pain in left shoulder: Secondary | ICD-10-CM

## 2021-06-05 NOTE — Therapy (Addendum)
[facility] Physical Therapy [address] [place], [place], [address] Phone: [phone]   Fax:  [phone]  Physical Therapy Evaluation/Discharge PHYSICAL THERAPY DISCHARGE SUMMARY  Visits from Start of Care: 1  Current functional level related to goals / functional outcomes: See note   Remaining deficits: See note   Education / Equipment: HEP  Plan: Patient is being discharged due to not returning to PT.  Elsie Ra, PT, DPT 11/13/21 1:30 PM     Patient Details  Name: Andrew Sandoval MRN: [id number] Date of Birth: 1980/07/17 Referring Provider (PT): Aundra Dubin, [place]   Encounter Date: 06/05/2021   PT End of Session - 06/05/21 0853     Visit Number 1    Number of Visits 4    Date for PT Re-Evaluation 08/28/21   long plan of care as may only come 1 time per month   PT Start Time 0805    PT Stop Time 0845    PT Time Calculation (min) 40 min    Activity Tolerance Patient tolerated treatment well    Behavior During Therapy [facility] for tasks assessed/performed             Past Medical History:  Diagnosis Date   Cocaine use 2006-2010   Hyperlipidemia 06/09/2017    Past Surgical History:  Procedure Laterality Date   No prior surgery      There were no vitals filed for this visit.    Subjective Assessment - 06/05/21 0805     Subjective He has been having pain in his left shoulder for a few weeks without known cause of injury.    Currently in Pain? Yes    Pain Score 8     Pain Location Shoulder    Pain Orientation Left    Pain Descriptors / Indicators Aching    Pain Type Acute pain    Pain Radiating Towards down his mid arm    Pain Onset 1 to 4 weeks ago    Pain Frequency Intermittent    Aggravating Factors  driving, lifting his arm up and behind his back    Pain Relieving Factors DN                [facility] PT Assessment - 06/05/21 0001       Assessment   Medical Diagnosis Lt shoulder pain    Referring Provider  (PT) Aundra Dubin, PA-C    Onset Date/Surgical Date --   4 week onset of pain   Hand Dominance Right    Prior Therapy nothing scheduled      Precautions   Precautions None      Balance Screen   Has the patient fallen in the past 6 months No    Has the patient had a decrease in activity level because of a fear of falling?  No    Is the patient reluctant to leave their home because of a fear of falling?  No      Prior Function   Level of Independence Independent    Vocation Full time employment    Social worker   Overall Cognitive Status Within Functional Limits for tasks assessed      Observation/Other Assessments   Focus on Therapeutic Outcomes (FOTO)  not competed language and or cognitive barrier but he says he is unable to finish      ROM / Strength   AROM / PROM / Strength Strength;AROM;PROM  AROM   AROM Assessment Site Shoulder    Right/Left Shoulder Left    Left Shoulder Flexion 110 Degrees    Left Shoulder ABduction 80 Degrees    Left Shoulder Internal Rotation --   L4 behind back   Left Shoulder External Rotation --   Lt ear behind head     PROM   Overall PROM Comments limited 25% due to pain ,empty end feel      Strength   Overall Strength Comments 4/5 MMT grossly for Lt UE strength and shoulder strength limited by pain      Palpation   Palpation comment TTP lateral shoulder and RTC insertion      Special Tests   Other special tests impingment tests positive                        Objective measurements completed on examination: See above findings.       [place] Adult PT Treatment/Exercise - 06/05/21 0001       Manual Therapy   Manual therapy comments active compression with STM and skilled palpation during DN              Trigger Point Dry Needling - 06/05/21 0001     Consent Given? Yes    Education Handout Provided Yes   verbal provided with assistance of interpreter   Dry Needling  Comments to RTC insertion, lateral delt with good twitch response noted, combined with Estim micro current and mili current intensity to tolerance.                   PT Education - 06/05/21 0853     Education Details HEP, DN, POC    Person(s) Educated Patient    Methods Explanation;Demonstration;Verbal cues;Handout    Comprehension Verbalized understanding;Need further instruction              PT Short Term Goals - 06/05/21 0859       PT SHORT TERM GOAL #1   Title He will verbalize understanding of HEP and PT plan of care    Baseline met today    Status Achieved               PT Long Term Goals - 06/05/21 0900       PT LONG TERM GOAL #1   Title to be written if he returns to PT                    Plan - 06/05/21 0854     Clinical Impression Statement Pt presents with subacute Lt shoulder pain with signs and symptoms consistent with Lt shoulder impingment. MD has recommended DN so we trialed this today with great response and he had immediate decreased pain and improved his Lt shoulder ROM from 110 to >145 deg. The patient prefers to try HEP for now and will follow up within one month if he still has pain. I wrote long PT plan of care as he may only attend PT one time per month but we will see him more if he feels it is necessary to address his impairments in Lt shoulder ROM, weakness, and pain.    Personal Factors and Comorbidities --   no significant co morbidities   Examination-Activity Limitations Lift;Sleep;Reach Overhead    Examination-Participation Restrictions Cleaning;Yard Work;Occupation    Stability/Clinical Decision Making Stable/Uncomplicated    Clinical Decision Making Low    Rehab Potential Good    PT  Frequency 1x / week   1 time per week to month   PT Duration 12 weeks    PT Treatment/Interventions Electrical Stimulation;Cryotherapy;ADLs/Self Care Home Management;Iontophoresis 79m/ml Dexamethasone;Ultrasound;Therapeutic  activities;Therapeutic exercise;Neuromuscular re-education;Manual techniques;Passive range of motion;Dry needling;Joint Manipulations;Vasopneumatic Device;Taping    PT Next Visit Plan how was DN and HEP?    PT Home Exercise Plan Access Code: [email or web address]   Consulted and Agree with Plan of Care Patient             Patient will benefit from skilled therapeutic intervention in order to improve the following deficits and impairments:  Decreased strength, Decreased range of motion, Impaired flexibility, Pain, Impaired UE functional use  Visit Diagnosis: Acute pain of left shoulder  Muscle weakness (generalized)     Problem List Patient Active Problem List   Diagnosis Date Noted   Bilateral carpal tunnel syndrome 02/02/2020   Chronic pruritic rash in adult 10/21/2019   Pain in both hands 08/18/2019   Acute pain of right shoulder 08/18/2019   Hyperlipidemia 06/09/2017   Enlarged tonsils 11/11/2016   Tinea pedis of right foot 11/11/2016   Numbness 11/11/2016   Positive H. pylori test 07/23/2016   Chronic nasal congestion 01/18/2015    BDebbe Odea PT,DPT 06/05/2021, 9:00 AM  [facility] [address] [address]Phone: [phone]  Fax:  [phone] Name: Andrew RozaMRN: [id number] of Birth: 6April 29, 1982

## 2021-06-05 NOTE — Patient Instructions (Signed)
Access Code: [email or web address] URL: [email or web address] Date: 06/05/2021 Prepared by: Ivery Quale  Exercises Shoulder Internal Rotation with Resistance - 2 x daily - 6 x weekly - 3 sets - 10 reps Standing Shoulder Posterior Capsule Stretch - 2 x daily - 6 x weekly - 3 sets - 10 reps Doorway Pec Stretch at 90 Degrees Abduction - 2 x daily - 6 x weekly - 3 sets - 10 reps Standing Shoulder Flexion Wall Slide - 2 x daily - 6 x weekly - 3 sets - 10 reps Standing Shoulder Abduction Slides at Wall - 2 x daily - 6 x weekly - 3 sets - 10 reps Standing Shoulder Row with Anchored Resistance - 2 x daily - 6 x weekly - 3 sets - 10 reps Shoulder Extension with Resistance - Neutral - 2 x daily - 6 x weekly - 3 sets - 10 reps Shoulder External Rotation with Anchored Resistance - 2 x daily - 6 x weekly - 3 sets - 10 reps Shoulder Internal Rotation with Resistance - 2 x daily - 6 x weekly - 3 sets - 10 reps

## 2021-06-13 ENCOUNTER — Ambulatory Visit: Payer: Self-pay | Attending: Family Medicine

## 2021-06-13 ENCOUNTER — Other Ambulatory Visit: Payer: Self-pay

## 2021-06-20 ENCOUNTER — Encounter: Payer: Self-pay | Admitting: Physician Assistant

## 2021-06-20 ENCOUNTER — Ambulatory Visit: Payer: Self-pay | Attending: Physician Assistant | Admitting: Physician Assistant

## 2021-06-20 ENCOUNTER — Other Ambulatory Visit: Payer: Self-pay

## 2021-06-20 VITALS — BP 134/82 | HR 64 | Ht 62.0 in | Wt 173.2 lb

## 2021-06-20 DIAGNOSIS — E7800 Pure hypercholesterolemia, unspecified: Secondary | ICD-10-CM

## 2021-06-20 DIAGNOSIS — Z789 Other specified health status: Secondary | ICD-10-CM

## 2021-06-20 DIAGNOSIS — R202 Paresthesia of skin: Secondary | ICD-10-CM

## 2021-06-20 MED ORDER — MELOXICAM 7.5 MG PO TABS
7.5000 mg | ORAL_TABLET | Freq: Two times a day (BID) | ORAL | 2 refills | Status: DC | PRN
  Filled 2021-06-20: qty 30, 15d supply, fill #0

## 2021-06-20 MED ORDER — ATORVASTATIN CALCIUM 40 MG PO TABS
ORAL_TABLET | Freq: Every day | ORAL | 6 refills | Status: DC
  Filled 2021-06-20: qty 30, 30d supply, fill #0
  Filled 2021-07-18: qty 30, 30d supply, fill #1
  Filled 2021-08-15: qty 90, 90d supply, fill #2
  Filled 2021-08-15: qty 30, 30d supply, fill #2
  Filled 2021-11-27: qty 60, 60d supply, fill #3

## 2021-06-20 MED ORDER — GABAPENTIN 300 MG PO CAPS
ORAL_CAPSULE | Freq: Every day | ORAL | 2 refills | Status: DC
  Filled 2021-06-20: qty 30, 30d supply, fill #0
  Filled 2021-10-26: qty 30, 30d supply, fill #1
  Filled 2021-11-27: qty 30, 30d supply, fill #2

## 2021-06-20 NOTE — Progress Notes (Signed)
Patient ID: Andrew Sandoval, male   DOB: Mar 22, 1981, 41 y.o.   MRN: [id number]   Andrew Sandoval, is a 41 y.o. male  UTM:[id number]  FKC:[id number]  DOB - [birth year]  Chief Complaint  Patient presents with   Hearing Problem       Subjective:   Andrew Sandoval is a 41 y.o. male here today for ear irrigation.  He has seen ENT and they put him on debrox to use occasionally.  He needs RF of meds.  No new issues or concerns otherwise.    Patient has No headache, No chest pain, No abdominal pain - No Nausea, No new weakness tingling or numbness, No Cough - SOB.  No problems updated.  ALLERGIES: Allergies  Allergen Reactions   Tylenol [Acetaminophen] Hives    PAST MEDICAL HISTORY: Past Medical History:  Diagnosis Date   Cocaine use 2006-2010   Hyperlipidemia 06/09/2017    MEDICATIONS AT HOME: Prior to Admission medications   Medication Sig Start Date End Date Taking? Authorizing Provider  atorvastatin (LIPITOR) 40 MG tablet TAKE 1 TABLET (40 MG TOTAL) BY MOUTH DAILY. 06/20/21 06/20/22  Anders Simmonds, PA-C  benzonatate (TESSALON) 100 MG capsule Take 1-2 capsules (100-200 mg total) by mouth 3 (three) times daily as needed for cough. 12/12/20   Wallis Bamberg, PA-C  carbamide peroxide (DEBROX) 6.5 % OTIC solution Place 5 drops into the right ear 2 (two) times daily. 11/22/20   Hoy Register, MD  cetirizine (ZYRTEC ALLERGY) 10 MG tablet Take 1 tablet (10 mg total) by mouth daily. 12/12/20   Wallis Bamberg, PA-C  fluticasone (FLONASE) 50 MCG/ACT nasal spray Place 2 sprays into both nostrils daily. 12/12/20   Wallis Bamberg, PA-C  gabapentin (NEURONTIN) 300 MG capsule TAKE 1 CAPSULE (300 MG TOTAL) BY MOUTH AT BEDTIME. 06/20/21 06/20/22  Rashun Grattan, Marzella Schlein, PA-C  halobetasol (ULTRAVATE) 0.05 % cream APPLY TOPICALLY 2 (TWO) TIMES DAILY. 11/22/20 11/22/21  Hoy Register, MD  meloxicam (MOBIC) 7.5 MG tablet Take 1 tablet (7.5 mg total) by mouth 2 (two) times daily as needed for pain. 06/20/21    Anders Simmonds, PA-C  promethazine-dextromethorphan (PROMETHAZINE-DM) 6.25-15 MG/5ML syrup Take 5 mLs by mouth at bedtime as needed for cough. 12/12/20   Wallis Bamberg, PA-C  pseudoephedrine (SUDAFED) 60 MG tablet Take 1 tablet (60 mg total) by mouth every 8 (eight) hours as needed for congestion. 12/12/20   Wallis Bamberg, PA-C    ROS: Neg HEENT Neg resp Neg cardiac Neg GI Neg GU Neg MS Neg psych Neg neuro  Objective:   Vitals:   06/20/21 1005  BP: 134/82  Pulse: 64  SpO2: 99%  Weight: 173 lb 3.2 oz (78.6 kg)  Height: 5\' 2"  (1.575 m)   Exam General appearance : Awake, alert, not in any distress. Speech Clear. Not toxic looking HEENT: Atraumatic and Normocephalic B ear canals free of wax build up.  B TM overall appear normal.  Neck: Supple, no JVD. No cervical lymphadenopathy.  Chest: Good air entry bilaterally, CTAB.  No rales/rhonchi/wheezing CVS: S1 S2 regular, no murmurs.  Extremities: B/L Lower Ext shows no edema, both legs are warm to touch Neurology: Awake alert, and oriented X 3, CN II-XII intact, Non focal Skin: No Rash  Data Review Lab Results  Component Value Date   HGBA1C 5.4 09/25/2016   HGBA1C 5.50 01/18/2015    Assessment & Plan   1. Pure hypercholesterolemia - atorvastatin (LIPITOR) 40 MG tablet; TAKE 1 TABLET (40 MG TOTAL) BY MOUTH  DAILY.  Dispense: 30 tablet; Refill: 6 - Lipid panel - Comprehensive metabolic panel  2. Paresthesia - gabapentin (NEURONTIN) 300 MG capsule; TAKE 1 CAPSULE (300 MG TOTAL) BY MOUTH AT BEDTIME.  Dispense: 30 capsule; Refill: 2 - meloxicam (MOBIC) 7.5 MG tablet; Take 1 tablet (7.5 mg total) by mouth 2 (two) times daily as needed for pain.  Dispense: 30 tablet; Refill: 2  3. Language barrier [employer] "Regan Rakers" interpreters used and additional time performing visit was required.     Patient have been counseled extensively about nutrition and exercise. Other issues discussed during this visit include: low cholesterol diet, weight  control and daily exercise, foot care, annual eye examinations at Ophthalmology, importance of adherence with medications and regular follow-up. We also discussed long term complications of uncontrolled diabetes and hypertension.   Return in about 6 months (around 12/18/2021) for PCP for chronic conditions.  The patient was given clear instructions to go to ER or return to medical center if symptoms don't improve, worsen or new problems develop. The patient verbalized understanding. The patient was told to call to get lab results if they haven't heard anything in the next week.      Georgian Co, PA-C [facility] and [facility] [place], [place] [phone]   06/20/2021, 10:17 AM

## 2021-06-21 LAB — COMPREHENSIVE METABOLIC PANEL
ALT: 44 IU/L (ref 0–44)
AST: 27 IU/L (ref 0–40)
Albumin/Globulin Ratio: 1.6 (ref 1.2–2.2)
Albumin: 4.4 g/dL (ref 4.0–5.0)
Alkaline Phosphatase: 124 IU/L — ABNORMAL HIGH (ref 44–121)
BUN/Creatinine Ratio: 21 — ABNORMAL HIGH (ref 9–20)
BUN: 20 mg/dL (ref 6–24)
Bilirubin Total: 0.5 mg/dL (ref 0.0–1.2)
CO2: 21 mmol/L (ref 20–29)
Calcium: 9.2 mg/dL (ref 8.7–10.2)
Chloride: 107 mmol/L — ABNORMAL HIGH (ref 96–106)
Creatinine, Ser: 0.94 mg/dL (ref 0.76–1.27)
Globulin, Total: 2.7 g/dL (ref 1.5–4.5)
Glucose: 102 mg/dL — ABNORMAL HIGH (ref 70–99)
Potassium: 4.2 mmol/L (ref 3.5–5.2)
Sodium: 143 mmol/L (ref 134–144)
Total Protein: 7.1 g/dL (ref 6.0–8.5)
eGFR: 104 mL/min/{1.73_m2} (ref >59)

## 2021-06-21 LAB — LIPID PANEL
Chol/HDL Ratio: 4.5 ratio (ref 0.0–5.0)
Cholesterol, Total: 163 mg/dL (ref 100–199)
HDL: 36 mg/dL — ABNORMAL LOW (ref >39)
LDL Chol Calc (NIH): 107 mg/dL — ABNORMAL HIGH (ref 0–99)
Triglycerides: 111 mg/dL (ref 0–149)
VLDL Cholesterol Cal: 20 mg/dL (ref 5–40)

## 2021-07-18 ENCOUNTER — Other Ambulatory Visit: Payer: Self-pay

## 2021-07-19 ENCOUNTER — Other Ambulatory Visit: Payer: Self-pay

## 2021-07-24 ENCOUNTER — Ambulatory Visit: Payer: Self-pay | Admitting: Family Medicine

## 2021-07-26 IMAGING — DX DG CHEST 2V
2 series · 2 of 2 positions shown · non-contrast
Comparison: 04/18/2009

CLINICAL DATA: Chest pain and short of breath 3 days

EXAM:
CHEST - 2 VIEW

[chest pa]
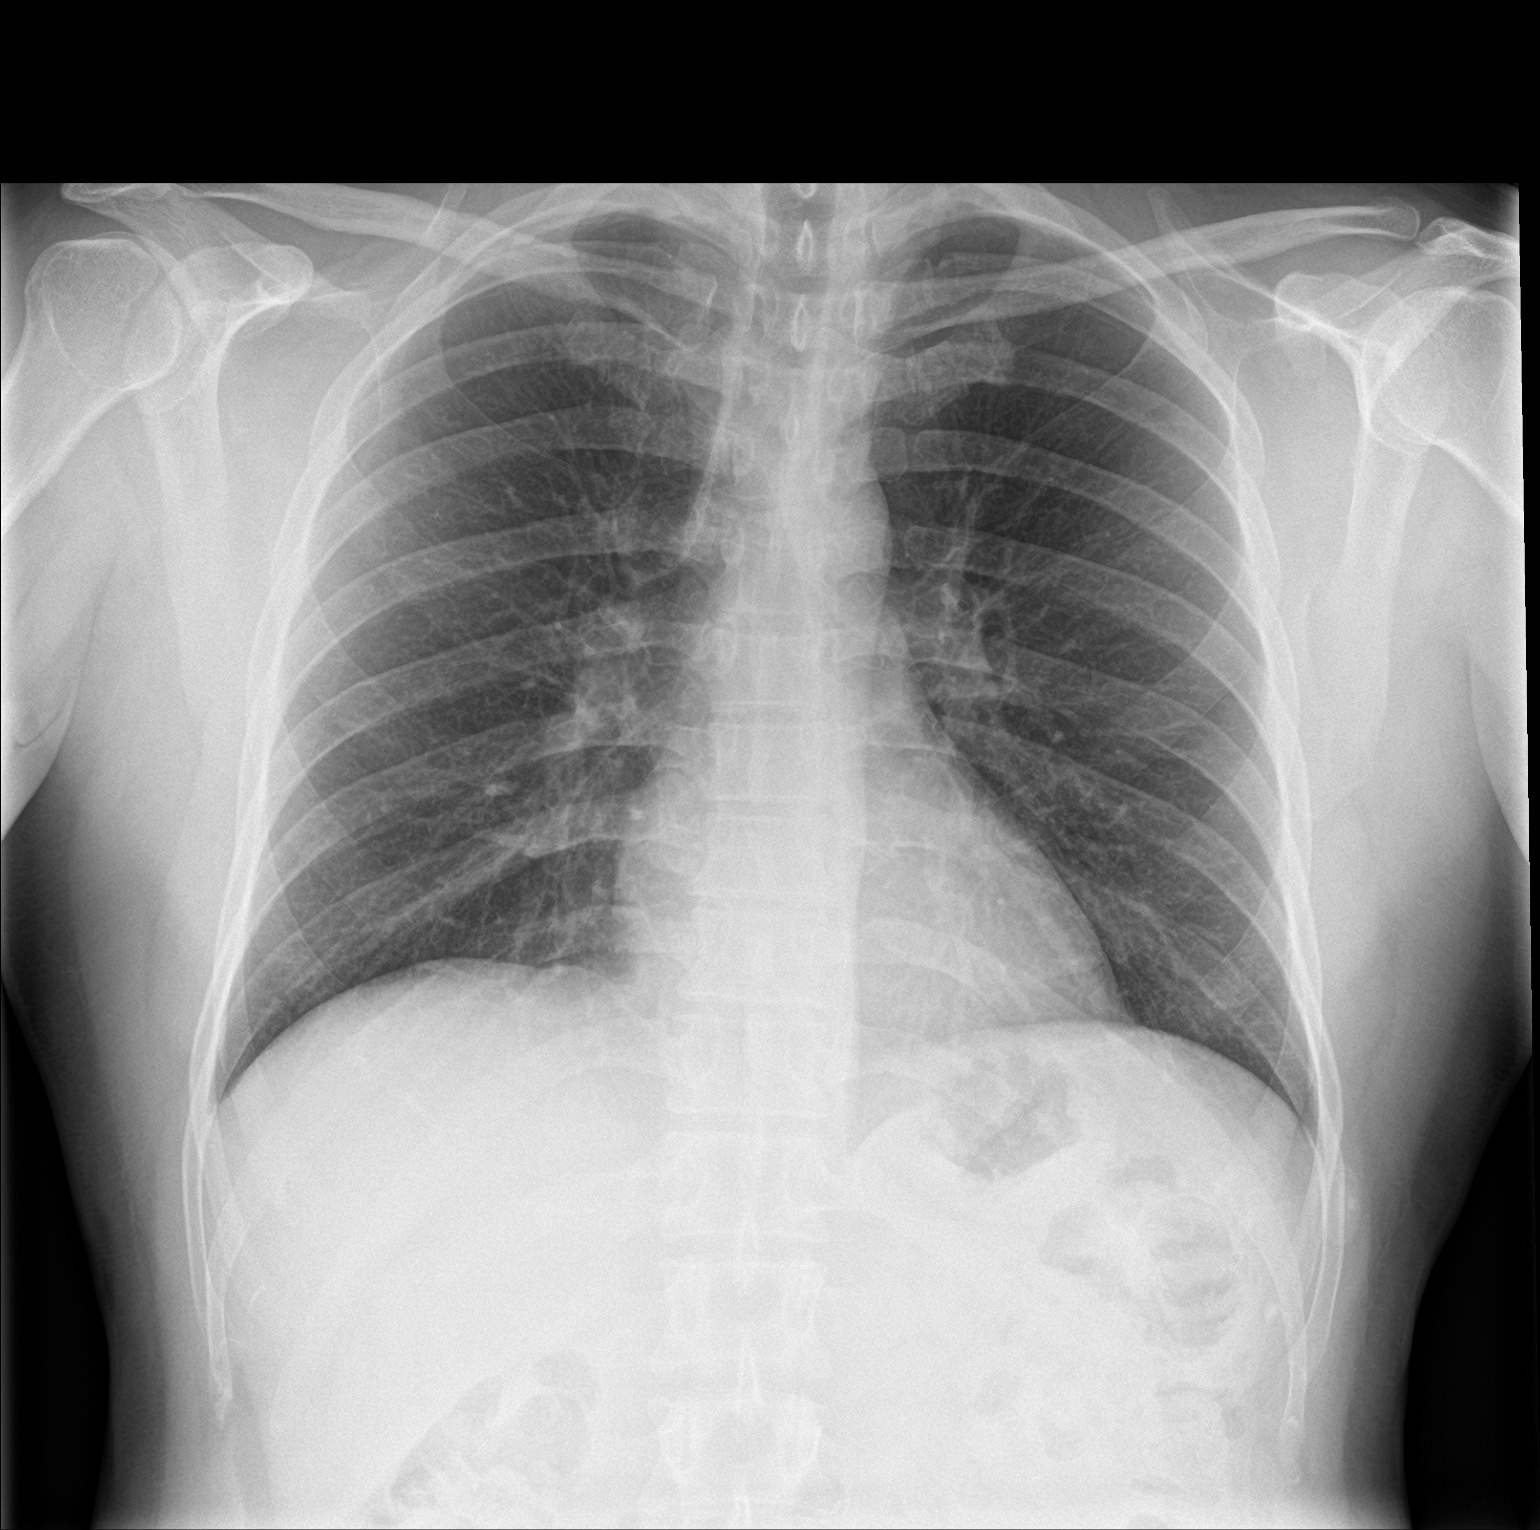

[chest lat]
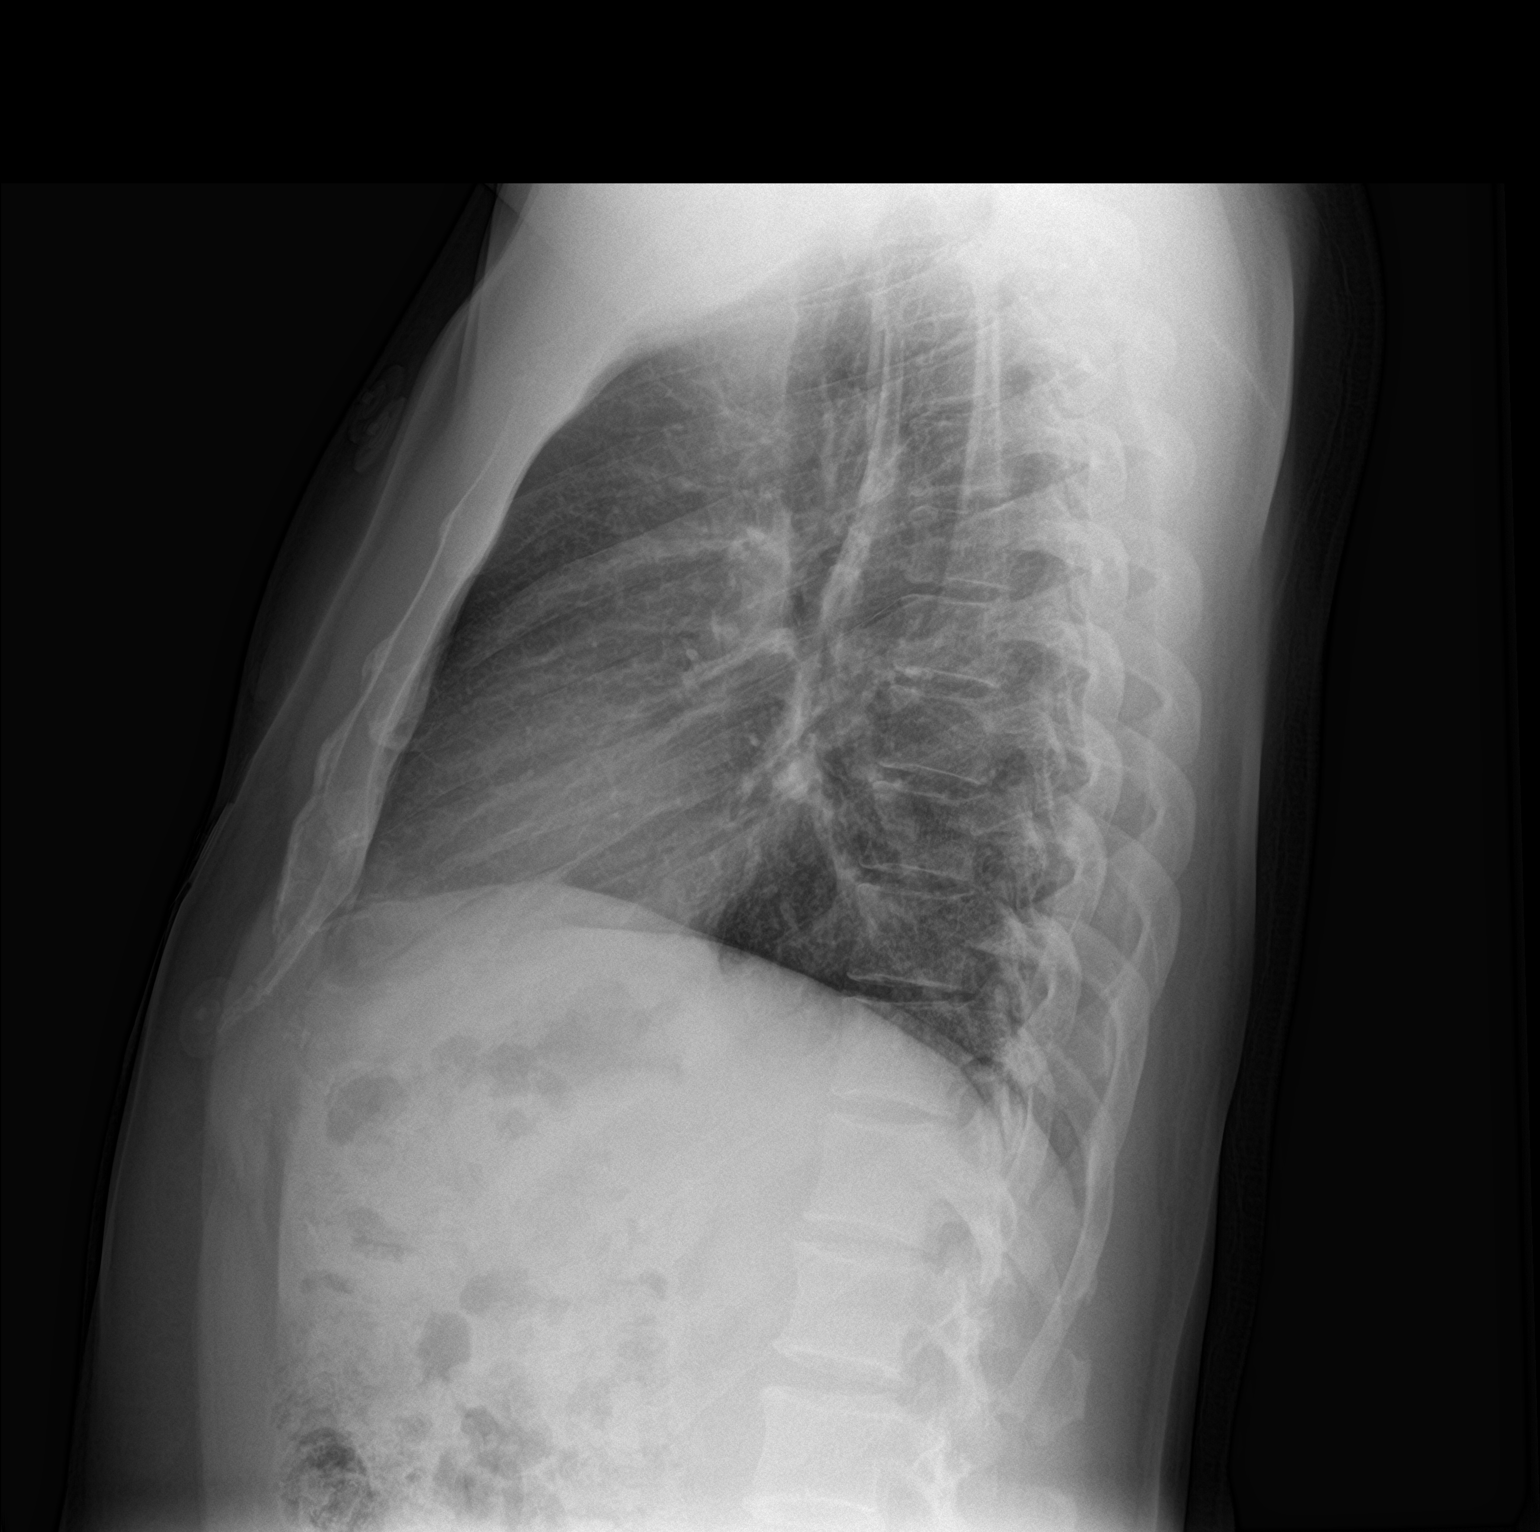

[2 of 2 positions shown; findings below may reference images not displayed]

FINDINGS: The heart size and mediastinal contours are within normal limits.
Both lungs are clear. The visualized skeletal structures are
unremarkable.
IMPRESSION: No active cardiopulmonary disease.

## 2021-08-01 ENCOUNTER — Other Ambulatory Visit: Payer: Self-pay

## 2021-08-01 ENCOUNTER — Ambulatory Visit: Payer: Self-pay | Attending: Family Medicine | Admitting: Family Medicine

## 2021-08-01 ENCOUNTER — Encounter: Payer: Self-pay | Admitting: Family Medicine

## 2021-08-01 VITALS — BP 126/85 | HR 86 | Temp 97.6°F | Ht 62.0 in | Wt 174.0 lb

## 2021-08-01 DIAGNOSIS — M7711 Lateral epicondylitis, right elbow: Secondary | ICD-10-CM

## 2021-08-01 DIAGNOSIS — E7800 Pure hypercholesterolemia, unspecified: Secondary | ICD-10-CM

## 2021-08-01 DIAGNOSIS — G5603 Carpal tunnel syndrome, bilateral upper limbs: Secondary | ICD-10-CM

## 2021-08-01 MED ORDER — PREDNISONE 20 MG PO TABS
20.0000 mg | ORAL_TABLET | Freq: Every day | ORAL | 0 refills | Status: DC
  Filled 2021-08-01: qty 5, 5d supply, fill #0

## 2021-08-01 NOTE — Progress Notes (Signed)
Has cramping in hands. Pain in right arm

## 2021-08-01 NOTE — Patient Instructions (Signed)
Codo de [place] Tennis Elbow El codo de tenista es la irritacin e hinchazn (inflamacin) en la zona externa del Alen Bleacher, cerca del codo. La hinchazn afecta los tejidos que conectan el msculo al hueso (tendones). El codo de [place] puede presentarse al Education administrator cualquier deporte o Education officer, environmental una tarea que exige el uso excesivo del codo. La causa del codo de [place] es la repeticin del mismo movimiento una y Laverda Page. Cules son las causas? Esta afeccin a menudo se produce por practicar deportes o realizar tareas en los que tiene que mover el antebrazo de la [place]. A veces, la afeccin puede deberse a una lesin repentina. Qu incrementa el riesgo? Tiene ms probabilidades de tener codo de tenista si juega al tenis o practica otro deporte con raqueta. Tambin tiene un mayor riesgo si [place] frecuentemente las manos para Printmaker. Esto puede comprender lo siguiente: Usuarios de computadoras. Trabajadores de Paramedic. Personas que trabajan en una fbrica. Msicos. Cocineros. Cajeros. Cules son los signos o sntomas? Dolor y sensibilidad a la palpacin en el Product manager y la parte externa del codo. Puede sentir dolor todo Museum/gallery conservator o solo cuando [place] el brazo. Sensacin de ardor. Esta empieza en el codo y se extiende por el brazo. Agarre dbil en la mano. Cmo se trata? Descansar el brazo y Contractor hielo es Designer, jewellery. El mdico tambin puede recomendarle: Medicamentos para reducir Chief Technology Officer y la hinchazn. Una correa para codo. Fisioterapia. Este tratamiento puede incluir masajes o ejercicios, o ambos. Un dispositivo ortopdico para el codo. Si estos no ayudan a que sus sntomas mejoren, el mdico puede recomendarle [place]. Siga estas instrucciones en su casa: Si tiene un dispositivo ortopdico o una correa: Use el dispositivo ortopdico o la correa como se lo haya indicado el mdico. Quteselos solamente como se lo haya indicado el mdico. Controle la piel  alrededor del dispositivo ortopdico o la correa todos [place]. Comunquese con su mdico si ve algn problema. Afljeselos si los dedos de la mano: [place]ormiguean. Se adormecen. Se tornan fros y de Edison International. Mantenga la correa o el dispositivo ortopdico limpio. Si el dispositivo ortopdico o la correa no son impermeables: No deje que se mojen. Cbralos con un envoltorio hermtico cuando tome un bao de inmersin o una ducha. Control del dolor, la rigidez y la hinchazn  Aplique hielo sobre la zona lesionada si se lo indican. Para hacer esto: Si tiene un dispositivo ortopdico o una correa desmontable, quteselo como se lo haya indicado el mdico. Ponga el hielo en una bolsa plstica. Coloque una toalla entre la piel y Copy. Aplique el hielo durante 20 minutos, 2 o 3 veces por da. Retire el hielo si la piel se le pone de color rojo brillante. Esto es Intel. Si no puede sentir dolor, calor o fro, tiene un mayor riesgo de que se dae la zona. Mueva los dedos con frecuencia. Actividad Descanse el codo y la [place]. Evite las actividades que pueden causar problemas en el codo, como se lo haya indicado el mdico. Haga los ejercicios como se lo haya indicado el mdico. Si levanta un objeto, hgalo con la palma de la mano [place] arriba. Estilo de vida Si el codo de [place] se debe a los deportes, revise el equipo y Manufacturing engineer de lo siguiente: Que lo est utilizando de forma correcta. Que es apto para usted. Si el codo de [place] se debe a su trabajo o al uso de una computadora, tmese descansos con frecuencia para  estirar el brazo. Hable con su gerente sobre cmo puede hacer que su afeccin mejore en el trabajo. Instrucciones generales Delphi de venta libre y los recetados solamente como se lo haya indicado el mdico. No fume ni consuma ningn producto que contenga nicotina o tabaco. Si necesita ayuda para dejar de consumir estos productos, consulte al  mdico. Cumpla con todas las visitas de seguimiento. Cmo se evita? Antes y despus de estar activo: Precaliente y elongue adecuadamente antes de hacer actividad fsica. Reljese y elongue despus de hacer actividad fsica. Dele al cuerpo tiempo para [employer] Corporation. Mientras est activo: Asegrese de usar el equipo que sea apto para usted. Si juega al tenis, dele potencia a su golpe con la parte inferior del cuerpo. Evite usar Building surveyor. Mantenga un estado fsico adecuado. Esto puede comprender lo siguiente: [place]. Flexibilidad. Resistencia. Haga ejercicios para fortalecer los msculos del Management consultant. Comunquese con un mdico si: El dolor no mejora con Dispensing optician. El dolor [place]. Tiene debilidad en el antebrazo, la mano o los dedos de la [place]. No puede sentir el antebrazo, la mano o los dedos de la [place]. Solicite ayuda de inmediato si: El dolor es muy intenso. No puede mover la [place]. Resumen El codo de [place] es la irritacin e hinchazn (inflamacin) en la zona externa del Joycie Peek, cerca del codo. Su causa es la repeticin del mismo movimiento una y Elmon Kirschner. Descanse el codo y la [place]. Evite las actividades de acuerdo con lo que le indique su mdico. Si se lo indican, aplique hielo sobre la zona lesionada durante 20 minutos, de 2 a 3 veces por da. Esta informacin no tiene Marine scientist el consejo del mdico. Asegrese de hacerle al mdico cualquier pregunta que tenga. Document Revised: 01/10/2020 Document Reviewed: 01/10/2020 Elsevier Patient Education  2022 [employer].

## 2021-08-01 NOTE — Progress Notes (Signed)
Subjective:  Patient ID: Andrew Sandoval, male    DOB: 14-Mar-1981  Age: 41 y.o. MRN: [id number]  CC: Hyperlipidemia   HPI Andrew Sandoval is a 41 y.o. year old male with a history of hyperlipidemia, bilateral carpal tunnel syndrome who presents today for an office visit. Last month he had follow-up of his chronic medical conditions and also had blood work done.  Interval History: He has had cramping of both hands  (a little over a week) and pain in his R elbow (x4 days). Denies history of trauma. He works at [employer] doing [employer].  His entire hand goes numb. Seen by Ortho and he has had Cortisone injections for bilateral carpal tunnel syndrome and surgery recommended but he is undecided about this. Last Ortho visit was in 05/2021. He points to his his R lateral epicondyle as location of the pain.  Denies presence of swelling or fever. Past Medical History:  Diagnosis Date   Cocaine use 2006-2010   Hyperlipidemia 06/09/2017    Past Surgical History:  Procedure Laterality Date   No prior surgery      Family History  Problem Relation Age of Onset   Diabetes Neg Hx     Allergies  Allergen Reactions   Tylenol [Acetaminophen] Hives    Outpatient Medications Prior to Visit  Medication Sig Dispense Refill   atorvastatin (LIPITOR) 40 MG tablet TAKE 1 TABLET (40 MG TOTAL) BY MOUTH DAILY. 30 tablet 6   carbamide peroxide (DEBROX) 6.5 % OTIC solution Place 5 drops into the right ear 2 (two) times daily. 15 mL 1   cetirizine (ZYRTEC ALLERGY) 10 MG tablet Take 1 tablet (10 mg total) by mouth daily. 30 tablet 0   fluticasone (FLONASE) 50 MCG/ACT nasal spray Place 2 sprays into both nostrils daily. 16 g 12   gabapentin (NEURONTIN) 300 MG capsule TAKE 1 CAPSULE (300 MG TOTAL) BY MOUTH AT BEDTIME. 30 capsule 2   halobetasol (ULTRAVATE) 0.05 % cream APPLY TOPICALLY 2 (TWO) TIMES DAILY. 50 g 2   meloxicam (MOBIC) 7.5 MG tablet Take 1 tablet (7.5 mg total) by mouth 2 (two) times  daily as needed for pain. 30 tablet 2   promethazine-dextromethorphan (PROMETHAZINE-DM) 6.25-15 MG/5ML syrup Take 5 mLs by mouth at bedtime as needed for cough. 100 mL 0   pseudoephedrine (SUDAFED) 60 MG tablet Take 1 tablet (60 mg total) by mouth every 8 (eight) hours as needed for congestion. 30 tablet 0   benzonatate (TESSALON) 100 MG capsule Take 1-2 capsules (100-200 mg total) by mouth 3 (three) times daily as needed for cough. (Patient not taking: Reported on 08/01/2021) 60 capsule 0   No facility-administered medications prior to visit.     ROS Review of Systems  Constitutional:  Negative for activity change and appetite change.  HENT:  Negative for sinus pressure and sore throat.   Eyes:  Negative for visual disturbance.  Respiratory:  Negative for cough, chest tightness and shortness of breath.   Cardiovascular:  Negative for chest pain and leg swelling.  Gastrointestinal:  Negative for abdominal distention, abdominal pain, constipation and diarrhea.  Endocrine: Negative.   Genitourinary:  Negative for dysuria.  Musculoskeletal:        See HPI  Skin:  Negative for rash.  Allergic/Immunologic: Negative.   Neurological:  Positive for numbness. Negative for weakness and light-headedness.  Psychiatric/Behavioral:  Negative for dysphoric mood and suicidal ideas.    Objective:  BP 126/85    Pulse 86  Temp 97.6 F (36.4 C) (Oral)    Ht 5\' 2"  (1.575 m)    Wt 174 lb (78.9 kg)    SpO2 97%    BMI 31.83 kg/m   BP/Weight 08/01/2021 [id number]  Systolic BP [id number]  Diastolic BP 85 82 83  Wt. (Lbs) 174 173.2 -  BMI 31.83 31.68 -      Physical Exam Constitutional:      Appearance: He is well-developed.  Cardiovascular:     Rate and Rhythm: Normal rate.     Heart sounds: Normal heart sounds. No murmur heard. Pulmonary:     Effort: Pulmonary effort is normal.     Breath sounds: Normal breath sounds. No wheezing or rales.  Chest:     Chest wall: No tenderness.   Abdominal:     General: Bowel sounds are normal. There is no distension.     Palpations: Abdomen is soft. There is no mass.     Tenderness: There is no abdominal tenderness.  Musculoskeletal:     Right lower leg: No edema.     Left lower leg: No edema.     Comments: Tenderness on palpation of lateral epicondyle of the right elbow Positive Tinel and Phalen sign bilaterally  Neurological:     Mental Status: He is alert and oriented to person, place, and time.  Psychiatric:        Mood and Affect: Mood normal.    CMP Latest Ref Rng & Units 06/20/2021 11/22/2020 07/04/2020  Glucose 70 - 99 mg/dL 102(H) 97 97  BUN 6 - 24 mg/dL 20 15 11   Creatinine 0.76 - 1.27 mg/dL 0.94 0.97 0.91  Sodium 134 - 144 mmol/L 143 140 138  Potassium 3.5 - 5.2 mmol/L 4.2 4.4 3.7  Chloride 96 - 106 mmol/L 107(H) 105 107  CO2 20 - 29 mmol/L 21 23 20(L)  Calcium 8.7 - 10.2 mg/dL 9.2 9.4 8.9  Total Protein 6.0 - 8.5 g/dL 7.1 7.2 -  Total Bilirubin 0.0 - 1.2 mg/dL 0.5 0.6 -  Alkaline Phos 44 - 121 IU/L 124(H) 120 -  AST 0 - 40 IU/L 27 30 -  ALT 0 - 44 IU/L 44 60(H) -    Lipid Panel     Component Value Date/Time   CHOL 163 06/20/2021 1024   TRIG 111 06/20/2021 1024   HDL 36 (L) 06/20/2021 1024   CHOLHDL 4.5 06/20/2021 1024   LDLCALC 107 (H) 06/20/2021 1024    CBC    Component Value Date/Time   WBC 5.2 07/04/2020 0905   RBC 5.48 07/04/2020 0905   HGB 16.9 07/04/2020 0905   HGB 16.3 06/06/2017 1157   HCT 50.3 07/04/2020 0905   HCT 48.5 06/06/2017 1157   PLT 283 07/04/2020 0905   PLT 303 06/06/2017 1157   MCV 91.8 07/04/2020 0905   MCV 91 06/06/2017 1157   MCH 30.8 07/04/2020 0905   MCHC 33.6 07/04/2020 0905   RDW 12.7 07/04/2020 0905   RDW 13.6 06/06/2017 1157   LYMPHSABS 2.1 06/06/2017 1157   MONOABS 0.4 04/18/2009 1420   EOSABS 0.1 06/06/2017 1157   BASOSABS 0.0 06/06/2017 1157    Lab Results  Component Value Date   HGBA1C 5.4 09/25/2016    Assessment & Plan:  1. Pure  hypercholesterolemia Controlled Continue statin  2. Bilateral carpal tunnel syndrome Uncontrolled Completed course of cortisone injections Next step is carpal tunnel release surgery but he is undecided about this Continue with bilateral wrist braces  3.  Lateral epicondylitis of right elbow We will place on short course of prednisone If symptoms persist advised to call his orthopedic for possible cortisone injection - predniSONE (DELTASONE) 20 MG tablet; Take 1 tablet (20 mg total) by mouth daily with breakfast.  Dispense: 5 tablet; Refill: 0   Meds ordered this encounter  Medications   predniSONE (DELTASONE) 20 MG tablet    Sig: Take 1 tablet (20 mg total) by mouth daily with breakfast.    Dispense:  5 tablet    Refill:  0    Follow-up: Return in about 6 months (around 01/29/2022) for Chronic medical conditions.       Charlott Rakes, MD, FAAFP. [facility], [place]   08/01/2021, 9:21 AM

## 2021-08-02 ENCOUNTER — Other Ambulatory Visit: Payer: Self-pay

## 2021-08-03 ENCOUNTER — Other Ambulatory Visit: Payer: Self-pay

## 2021-08-15 ENCOUNTER — Other Ambulatory Visit: Payer: Self-pay

## 2021-08-16 ENCOUNTER — Other Ambulatory Visit: Payer: Self-pay

## 2021-08-17 ENCOUNTER — Other Ambulatory Visit: Payer: Self-pay

## 2021-08-22 ENCOUNTER — Ambulatory Visit: Payer: Self-pay | Admitting: Family Medicine

## 2021-09-05 ENCOUNTER — Other Ambulatory Visit: Payer: Self-pay

## 2021-09-25 ENCOUNTER — Telehealth: Payer: Self-pay | Admitting: Family Medicine

## 2021-09-25 NOTE — Telephone Encounter (Signed)
Copied from CRM [id number]. Topic: General - Inquiry ?>> Sep 25, 2021  3:21 PM Aretta Nip wrote: ?Pt wants a FU call re the orange card, he needs a letter to go to ortho. He has question about getting a copy of a letter they say he must have. FU at [phone]. Will not be seen without. States asap ?

## 2021-09-27 NOTE — Telephone Encounter (Signed)
OC paperwork has been printed and will be placed upfront for pick up. ?

## 2021-10-03 ENCOUNTER — Other Ambulatory Visit: Payer: Self-pay

## 2021-10-03 ENCOUNTER — Encounter: Payer: Self-pay | Admitting: Orthopaedic Surgery

## 2021-10-03 ENCOUNTER — Ambulatory Visit (INDEPENDENT_AMBULATORY_CARE_PROVIDER_SITE_OTHER): Payer: Self-pay | Admitting: Orthopaedic Surgery

## 2021-10-03 ENCOUNTER — Ambulatory Visit (INDEPENDENT_AMBULATORY_CARE_PROVIDER_SITE_OTHER): Payer: Self-pay

## 2021-10-03 DIAGNOSIS — M7711 Lateral epicondylitis, right elbow: Secondary | ICD-10-CM

## 2021-10-03 MED ORDER — DICLOFENAC SODIUM 75 MG PO TBEC
75.0000 mg | DELAYED_RELEASE_TABLET | Freq: Two times a day (BID) | ORAL | 2 refills | Status: DC | PRN
  Filled 2021-10-03: qty 60, 30d supply, fill #0

## 2021-10-03 NOTE — Progress Notes (Signed)
? ?Office Visit Note ?  ?Patient: Andrew Sandoval           ?Date of Birth: May 19, 1981           ?MRN: [id number] ?Visit Date: 10/03/2021 ?             ?Requested by: Hoy Register, MD ?[address] [place] ?[address] ?[place],  [place] [address] ?PCP: Hoy Register, MD ? ? ?Assessment & Plan: ?Visit Diagnoses:  ?1. Lateral epicondylitis of right elbow   ? ? ?Plan: Impression is right elbow lateral epicondylitis.  Today, we discussed several treatment options to include NSAIDs, injection, counterforce strap and exercises.  He will first proceed with NSAIDs, counterforce strap and exercises.  If his symptoms have not improved over the next several weeks he will follow-up for injection.  Call with concerns or questions in the meantime. ? ?Follow-Up Instructions: Return if symptoms worsen or fail to improve.  ? ?Orders:  ?Orders Placed This Encounter  ?Procedures  ? XR Elbow 2 Views Right  ? ?No orders of the defined types were placed in this encounter. ? ? ? ? Procedures: ?No procedures performed ? ? ?Clinical Data: ?No additional findings. ? ? ?Subjective: ?Chief Complaint  ?Patient presents with  ? Right Elbow - Pain  ? ? 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  Symptoms appear to be worse when he is lifting anything with weight such as when he is putting pizza and logging.  He does not take medication for this. ? ?Review of Systems as detailed in HPI.  All others reviewed and are negative. ? ? ?Objective: ?Vital Signs: There were no vitals taken for this visit. ? ?Physical Exam well-developed well-nourished gentleman in no acute distress.  Alert and oriented x3. ? ?Ortho Exam right elbow exam shows moderate tenderness to the lateral epicondyle.  No tenderness to the radial tunnel.  He does have moderate pain with shaking my hand as well  as with resisted long finger extension and supination. ? ?Specialty Comments:  ?No specialty comments available. ? ?Imaging: ?XR Elbow 2 Views Right ? ?Result Date: 10/03/2021 ?No acute or structural abnormalities  ? ? ?PMFS History: ?Patient Active Problem List  ? Diagnosis Date Noted  ? Bilateral carpal tunnel syndrome 02/02/2020  ? Chronic pruritic rash in adult 10/21/2019  ? Pain in both hands 08/18/2019  ? Acute pain of right shoulder 08/18/2019  ? Hyperlipidemia 06/09/2017  ? Enlarged tonsils 11/11/2016  ? Tinea pedis of right foot 11/11/2016  ? Numbness 11/11/2016  ? Positive H. pylori test 07/23/2016  ? Chronic nasal congestion 01/18/2015  ? ?Past Medical History:  ?Diagnosis Date  ? Cocaine use 2006-2010  ? Hyperlipidemia 06/09/2017  ?  ?Family History  ?Problem Relation Age of Onset  ? Diabetes Neg Hx   ?  ?Past Surgical History:  ?Procedure Laterality Date  ? No prior surgery    ? ?Social History  ? ?Occupational History  ?  Comment: [employer]  ?Tobacco Use  ? Smoking status: Former  ? Smokeless tobacco: Never  ? Tobacco comments:  ?  quit 12 years ago  ?Vaping Use  ? Vaping Use: Never used  ?Substance and Sexual Activity  ? Alcohol use: No  ?  Alcohol/week: 0.0 standard drinks  ?  Comment: quit 12 years  ago  ? Drug use: Not Currently  ?  Types: Cocaine, Marijuana  ?  Comment: Prior cocaine; none in 11 years  ? Sexual activity: Yes  ? ? ? ? ? ? ?

## 2021-10-26 ENCOUNTER — Other Ambulatory Visit: Payer: Self-pay

## 2021-10-29 ENCOUNTER — Other Ambulatory Visit: Payer: Self-pay

## 2021-10-31 ENCOUNTER — Ambulatory Visit (HOSPITAL_COMMUNITY)
Admission: EM | Admit: 2021-10-31 | Discharge: 2021-10-31 | Disposition: A | Discharge status: Home / Self Care | Payer: Self-pay | Attending: Emergency Medicine | Admitting: Emergency Medicine

## 2021-10-31 ENCOUNTER — Other Ambulatory Visit: Payer: Self-pay

## 2021-10-31 ENCOUNTER — Encounter (HOSPITAL_COMMUNITY): Payer: Self-pay

## 2021-10-31 DIAGNOSIS — R21 Rash and other nonspecific skin eruption: Secondary | ICD-10-CM

## 2021-10-31 MED ORDER — HYDROCORTISONE 2.5 % EX CREA
TOPICAL_CREAM | Freq: Two times a day (BID) | CUTANEOUS | 0 refills | Status: DC
  Filled 2021-10-31: qty 30, 15d supply, fill #0

## 2021-10-31 NOTE — ED Provider Notes (Signed)
[facility]    CSN: [id number] Arrival date & time: 10/31/21  1204     History   Chief Complaint Chief Complaint  Patient presents with   Rash    HPI Andrew Sandoval is a 41 y.o. male.  Presents with rash x2 weeks.  He has been itching all over his upper body.  He has not tried any medications or lotions.  No known exposures to plants, has not been in the woods.  Has not changed detergents or soaps.  He has been itching at the rash.  Denies pain or drainage.  No recent travel.  Denies fever, chills, shortness of breath, vomiting/diarrhea, abdominal pain. Additionally complains of right elbow pain that has been consistent for the past 3 to 4 months.  He does see physical therapy for this.  He also takes daily meloxicam.  Past Medical History:  Diagnosis Date   Cocaine use 2006-2010   Hyperlipidemia 06/09/2017    Patient Active Problem List   Diagnosis Date Noted   Bilateral carpal tunnel syndrome 02/02/2020   Chronic pruritic rash in adult 10/21/2019   Pain in both hands 08/18/2019   Acute pain of right shoulder 08/18/2019   Hyperlipidemia 06/09/2017   Enlarged tonsils 11/11/2016   Tinea pedis of right foot 11/11/2016   Numbness 11/11/2016   Positive H. pylori test 07/23/2016   Chronic nasal congestion 01/18/2015    Past Surgical History:  Procedure Laterality Date   No prior surgery         Home Medications    Prior to Admission medications   Medication Sig Start Date End Date Taking? Authorizing Provider  hydrocortisone 2.5 % cream Apply topically 2 (two) times daily. 10/31/21  Yes Omid Deardorff, Lurena Joiner, PA-C  atorvastatin (LIPITOR) 40 MG tablet TAKE 1 TABLET (40 MG TOTAL) BY MOUTH DAILY. 06/20/21 06/20/22  Anders Simmonds, PA-C  benzonatate (TESSALON) 100 MG capsule Take 1-2 capsules (100-200 mg total) by mouth 3 (three) times daily as needed for cough. Patient not taking: Reported on 08/01/2021 12/12/20   Wallis Bamberg, PA-C  carbamide peroxide (DEBROX)  6.5 % OTIC solution Place 5 drops into the right ear 2 (two) times daily. 11/22/20   Hoy Register, MD  cetirizine (ZYRTEC ALLERGY) 10 MG tablet Take 1 tablet (10 mg total) by mouth daily. 12/12/20   Wallis Bamberg, PA-C  diclofenac (VOLTAREN) 75 MG EC tablet Take 1 tablet (75 mg total) by mouth 2 (two) times daily as needed. 10/03/21   Cristie Hem, PA-C  fluticasone (FLONASE) 50 MCG/ACT nasal spray Place 2 sprays into both nostrils daily. 12/12/20   Wallis Bamberg, PA-C  gabapentin (NEURONTIN) 300 MG capsule TAKE 1 CAPSULE (300 MG TOTAL) BY MOUTH AT BEDTIME. 06/20/21 06/20/22  McClung, Marzella Schlein, PA-C  halobetasol (ULTRAVATE) 0.05 % cream APPLY TOPICALLY 2 (TWO) TIMES DAILY. 11/22/20 11/22/21  Hoy Register, MD  meloxicam (MOBIC) 7.5 MG tablet Take 1 tablet (7.5 mg total) by mouth 2 (two) times daily as needed for pain. 06/20/21   Anders Simmonds, PA-C  predniSONE (DELTASONE) 20 MG tablet Take 1 tablet (20 mg total) by mouth daily with breakfast. 08/01/21   Hoy Register, MD  promethazine-dextromethorphan (PROMETHAZINE-DM) 6.25-15 MG/5ML syrup Take 5 mLs by mouth at bedtime as needed for cough. 12/12/20   Wallis Bamberg, PA-C  pseudoephedrine (SUDAFED) 60 MG tablet Take 1 tablet (60 mg total) by mouth every 8 (eight) hours as needed for congestion. 12/12/20   Wallis Bamberg, PA-C    Family History  Family History  Problem Relation Age of Onset   Diabetes Neg Hx     Social History Social History   Tobacco Use   Smoking status: Former   Smokeless tobacco: Never   Tobacco comments:    quit 12 years ago  Vaping Use   Vaping Use: Never used  Substance Use Topics   Alcohol use: No    Alcohol/week: 0.0 standard drinks    Comment: quit 12 years ago   Drug use: Not Currently    Types: Cocaine, Marijuana    Comment: Prior cocaine; none in 11 years     Allergies   Tylenol [acetaminophen]   Review of Systems Review of Systems  Skin:  Positive for rash.   As per HPI  Physical Exam Triage Vital  Signs ED Triage Vitals  Enc Vitals Group     BP 10/31/21 1312 124/75     Pulse Rate 10/31/21 1312 82     Resp 10/31/21 1312 16     Temp 10/31/21 1312 97.8 F (36.6 C)     Temp Source 10/31/21 1312 Oral     SpO2 10/31/21 1312 100 %     Weight --      Height --      Head Circumference --      Peak Flow --      Pain Score 10/31/21 1353 0     Pain Loc --      Pain Edu? --      Excl. in GC? --    No data found.  Updated Vital Signs BP 124/75 (BP Location: Left Arm)   Pulse 82   Temp 97.8 F (36.6 C) (Oral)   Resp 16   SpO2 100%   Visual Acuity Right Eye Distance:   Left Eye Distance:   Bilateral Distance:    Right Eye Near:   Left Eye Near:    Bilateral Near:     Physical Exam Vitals and nursing note reviewed.  Constitutional:      General: He is not in acute distress.    Appearance: He is well-developed.  HENT:     Mouth/Throat:     Mouth: Mucous membranes are moist.     Pharynx: Oropharynx is clear.  Eyes:     Conjunctiva/sclera: Conjunctivae normal.  Cardiovascular:     Rate and Rhythm: Normal rate and regular rhythm.     Heart sounds: Normal heart sounds.  Pulmonary:     Effort: Pulmonary effort is normal. No respiratory distress.     Breath sounds: Normal breath sounds.  Abdominal:     General: Bowel sounds are normal.     Palpations: Abdomen is soft.     Tenderness: There is no abdominal tenderness.  Musculoskeletal:     Cervical back: Normal range of motion.  Lymphadenopathy:     Cervical: No cervical adenopathy.  Skin:    Findings: Rash present.     Comments: Maculopapular rash with excoriations present to bilateral shoulders, upper back, underneath armpits, few scattered bumps on chest.  Appears consistent with a contact dermatitis  Neurological:     Mental Status: He is alert.    UC Treatments / Results  Labs (all labs ordered are listed, but only abnormal results are displayed) Labs Reviewed - No data to display  EKG  Radiology No  results found.  Procedures Procedures (including critical care time)  Medications Ordered in UC Medications - No data to display  Initial Impression / Assessment and Plan / UC  Course  I have reviewed the triage vital signs and the nursing notes.  Pertinent labs & imaging results that were available during my care of the patient were reviewed by me and considered in my medical decision making (see chart for details).  Rash appears consistent with contact dermatitis.  Discussed with patient he should apply steroid cream twice daily to the areas.  Do not believe rash is extensive enough to use oral steroids at this time.  We will try steroid cream and see if that alleviates patient's symptoms.  Discussed with patient this cream should help with the itching and appearance of the rash.  Discussed it is important to try to not itch at the rash is much as possible as this can make it worse.  Also discussed continuing meloxicam for his elbow pain.  I also recommend ice to the area.  He has an appointment with his physical therapist next Wednesday that I recommended he keep and follow-up with them. Return precautions were discussed and patient is discharged in stable condition.  Final Clinical Impressions(s) / UC Diagnoses   Final diagnoses:  Rash     Discharge Instructions      Please use the hydrocortisone cream twice daily.  It is important to try not itching your rash as it can get worse.  Continue your daily meloxicam for your elbow pain.  I recommend using ice to the area.  Follow-up with your physical therapist.  Please return to the urgent care or emergency department if symptoms worsen or do not improve.    ED Prescriptions     Medication Sig Dispense Auth. Provider   hydrocortisone 2.5 % cream Apply topically 2 (two) times daily. 30 g Raneem Mendolia, Lurena Joinerebecca, PA-C      PDMP not reviewed this encounter.   Seira Cody, Lurena JoinerRebecca, [place]PA-C 10/31/21 1505

## 2021-10-31 NOTE — Discharge Instructions (Addendum)
Please use the hydrocortisone cream twice daily.  It is important to try not itching your rash as it can get worse.  Continue your daily meloxicam for your elbow pain.  I recommend using ice to the area.  Follow-up with your physical therapist.  Please return to the urgent care or emergency department if symptoms worsen or do not improve.

## 2021-10-31 NOTE — ED Triage Notes (Signed)
Pt presents for rash all over body x 2 weeks.

## 2021-11-06 ENCOUNTER — Other Ambulatory Visit: Payer: Self-pay | Admitting: Family Medicine

## 2021-11-06 ENCOUNTER — Other Ambulatory Visit: Payer: Self-pay

## 2021-11-07 ENCOUNTER — Encounter: Payer: Self-pay | Admitting: Orthopaedic Surgery

## 2021-11-07 ENCOUNTER — Ambulatory Visit (INDEPENDENT_AMBULATORY_CARE_PROVIDER_SITE_OTHER): Payer: Self-pay | Admitting: Orthopaedic Surgery

## 2021-11-07 ENCOUNTER — Other Ambulatory Visit: Payer: Self-pay

## 2021-11-07 DIAGNOSIS — M7711 Lateral epicondylitis, right elbow: Secondary | ICD-10-CM

## 2021-11-07 DIAGNOSIS — G5603 Carpal tunnel syndrome, bilateral upper limbs: Secondary | ICD-10-CM

## 2021-11-07 MED ORDER — BUPIVACAINE HCL 0.25 % IJ SOLN
0.3300 mL | INTRAMUSCULAR | Status: AC | PRN
  Administered 2021-11-07: .33 mL

## 2021-11-07 MED ORDER — METHYLPREDNISOLONE ACETATE 40 MG/ML IJ SUSP
40.0000 mg | INTRAMUSCULAR | Status: AC | PRN
  Administered 2021-11-07: 40 mg

## 2021-11-07 MED ORDER — LIDOCAINE HCL 1 % IJ SOLN
1.0000 mL | INTRAMUSCULAR | Status: AC | PRN
  Administered 2021-11-07: 1 mL

## 2021-11-07 NOTE — Progress Notes (Signed)
Office Visit Note   Patient: Andrew Sandoval           Date of Birth: 12-07-1980           MRN: [id number] Visit Date: 11/07/2021              Requested by: Hoy Register, MD [address] [place] [address] [place],  [place] [address] PCP: Hoy Register, MD   Assessment & Plan: Visit Diagnoses:  1. Bilateral carpal tunnel syndrome   2. Lateral epicondylitis of right elbow     Plan: Impression is bilateral carpal tunnel syndrome moderate to severe on the right moderate on the left in addition to right sided tennis elbow.  In regards to the carpal tunnel syndrome, we have again recommended surgical intervention due to the severity.  Patient however would like to proceed with repeat injections as the last ones significantly helped.  In regards to the right elbow, we have discussed proceeding with cortisone injection today.  He will continue wearing his counterforce strap.  Follow-up as needed.  Call with concerns or questions.  Follow-Up Instructions: Return if symptoms worsen or fail to improve.   Orders:  Orders Placed This Encounter  Procedures   Hand/UE Inj: bilateral carpal tunnel   Hand/UE Inj: R elbow   No orders of the defined types were placed in this encounter.     Procedures: Hand/UE Inj: bilateral carpal tunnel for carpal tunnel syndrome on 11/07/2021 9:58 AM Indications: pain Details: 25 G needle, volar approach Medications (Right): 1 mL lidocaine 1 %; 40 mg methylPREDNISolone acetate 40 MG/ML; 0.33 mL bupivacaine 0.25 % Medications (Left): 40 mg methylPREDNISolone acetate 40 MG/ML; 1 mL lidocaine 1 %; 0.33 mL bupivacaine 0.25 %   Hand/UE Inj: R elbow for lateral epicondylitis on 11/07/2021 9:58 AM Indications: pain Details: 22 G needle Medications: 1 mL lidocaine 1 %; 0.33 mL bupivacaine 0.25 %; 40 mg methylPREDNISolone acetate 40 MG/ML     Clinical Data: No additional findings.   Subjective: Chief Complaint  Patient presents with   Right Elbow -  Pain    HPI patient is a pleasant 41 year old Spanish-speaking gentleman who is here today with interpreter.  He is here with recurrent bilateral carpal tunnel syndrome.  Previous EMG showed moderate to severe compression on the right and moderate on the left.  He has had cortisone injections to the carpal tunnel in both hands with the last recent one being in December of last year.  He notes that these injections provided close to 6 months relief.  He is currently not interested in surgery even though this is been recommended.  Other issue he brings up today is continued pain to the right lateral elbow.  He was seen in our office little over a month ago for tennis elbow.  He was provided a counterforce strap as well as exercises and a prescription for NSAIDs.  He denies any significant relief.  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  He is interested in cortisone injection at this point.  Review of Systems as detailed in HPI.  All others reviewed are negative.   Objective: Vital Signs: There were no vitals taken for this visit.  Physical Exam well-developed well-nourished gentleman in no acute distress.  Alert and oriented x3.  Ortho Exam right elbow exam shows moderate tenderness to the lateral epicondyle.  No tenderness to the radial tunnel.  Significant pain with shaking my hand  as well as resisted long finger extension.  Bilateral wrist exam shows positive Phalen and Tinel.  No thenar atrophy.  He is neurovascularly intact distally.  Specialty Comments:  No specialty comments available.  Imaging: No new imaging   PMFS History: Patient Active Problem List   Diagnosis Date Noted   Bilateral carpal tunnel syndrome 02/02/2020   Chronic pruritic rash in adult 10/21/2019   Pain in both hands 08/18/2019   Acute pain of right shoulder 08/18/2019   Hyperlipidemia 06/09/2017   Enlarged tonsils 11/11/2016   Tinea pedis of  right foot 11/11/2016   Numbness 11/11/2016   Positive H. pylori test 07/23/2016   Chronic nasal congestion 01/18/2015   Past Medical History:  Diagnosis Date   Cocaine use 2006-2010   Hyperlipidemia 06/09/2017    Family History  Problem Relation Age of Onset   Diabetes Neg Hx     Past Surgical History:  Procedure Laterality Date   No prior surgery     Social History   Occupational History    Comment: [employer]  Tobacco Use   Smoking status: Former   Smokeless tobacco: Never   Tobacco comments:    quit 12 years ago  Vaping Use   Vaping Use: Never used  Substance and Sexual Activity   Alcohol use: No    Alcohol/week: 0.0 standard drinks    Comment: quit 12 years ago   Drug use: Not Currently    Types: Cocaine, Marijuana    Comment: Prior cocaine; none in 11 years   Sexual activity: Yes

## 2021-11-27 ENCOUNTER — Encounter: Payer: Self-pay | Admitting: Family Medicine

## 2021-11-27 ENCOUNTER — Other Ambulatory Visit: Payer: Self-pay

## 2021-11-27 ENCOUNTER — Ambulatory Visit: Payer: Self-pay | Attending: Family Medicine | Admitting: Family Medicine

## 2021-11-27 VITALS — BP 124/81 | HR 59 | Temp 97.8°F | Ht 62.0 in | Wt 173.0 lb

## 2021-11-27 DIAGNOSIS — L309 Dermatitis, unspecified: Secondary | ICD-10-CM

## 2021-11-27 DIAGNOSIS — J328 Other chronic sinusitis: Secondary | ICD-10-CM

## 2021-11-27 DIAGNOSIS — E7800 Pure hypercholesterolemia, unspecified: Secondary | ICD-10-CM

## 2021-11-27 MED ORDER — TRIAMCINOLONE ACETONIDE 0.1 % EX CREA
1.0000 | TOPICAL_CREAM | Freq: Two times a day (BID) | CUTANEOUS | 0 refills | Status: DC
  Filled 2021-11-27: qty 80, 40d supply, fill #0

## 2021-11-27 MED ORDER — FLUTICASONE PROPIONATE 50 MCG/ACT NA SUSP
2.0000 | Freq: Every day | NASAL | 12 refills | Status: DC
  Filled 2021-11-27: qty 16, 30d supply, fill #0
  Filled 2022-05-17: qty 16, 30d supply, fill #1
  Filled 2022-08-06: qty 16, 30d supply, fill #2
  Filled 2022-11-05: qty 16, 30d supply, fill #3

## 2021-11-27 MED ORDER — CETIRIZINE HCL 10 MG PO TABS
10.0000 mg | ORAL_TABLET | Freq: Every day | ORAL | 12 refills | Status: DC
  Filled 2021-11-27: qty 30, 30d supply, fill #0
  Filled 2022-01-23: qty 30, 30d supply, fill #1
  Filled 2022-03-15: qty 30, 30d supply, fill #2

## 2021-11-27 MED ORDER — ATORVASTATIN CALCIUM 40 MG PO TABS: ORAL_TABLET | Freq: Every day | ORAL | 6 refills | Status: DC

## 2021-11-27 NOTE — Patient Instructions (Signed)
Dermatitis atpica Atopic Dermatitis La dermatitis atpica es un trastorno de la piel que causa inflamacin. Se caracteriza por una erupcin roja y piel seca y escamosa con comezn. Es el tipo ms frecuente de eccema. El eccema es un grupo de afecciones de la piel que hacen que esta se ponga spera e hinchada. Esta afeccin, generalmente, empeora durante los meses fros del invierno y suele mejorar durante los meses clidos del verano. La dermatitis atpica, normalmente, comienza a manifestarse en la infancia y puede durar hasta la adultez. Esta afeccin no se transmite de una persona a otra (no es contagiosa). La dermatitis atpica puede no estar siempre presente, pero cuando lo est, se denomina brote. Cules son las causas? Se desconoce la causa exacta de esta afeccin. Los brotes pueden desencadenarse por: Entrar en contacto con alguna cosa a la que es sensible o alrgico (alrgeno). Estrs. Ciertos alimentos. Clima extremadamente clido o fro. Jabones y sustancias qumicas fuertes. Aire seco. Cloro. Qu incrementa el riesgo? Es ms probable que esta afeccin se desarrolle en personas con antecedentes familiares de: Eccema. Alergias. Asma. Fiebre del heno. Cules son los signos o los sntomas? Los sntomas de esta afeccin incluyen: Piel seca y escamosa. Erupcin roja y que pica. Picazn, que puede ser muy intensa. Puede ocurrir antes de la erupcin en la piel. Esto puede dificultar el sueo. Engrosamiento y agrietamiento de la piel que pueden producirse con el tiempo. Cmo se diagnostica? Esta afeccin se diagnostica en funcin de lo siguiente: Sus sntomas. Sus antecedentes mdicos. Un examen fsico. Cmo se trata? No hay cura para esta afeccin, pero los sntomas, normalmente, se pueden controlar. El tratamiento se centra en lo siguiente: Controlar la picazn y el rascado. Probablemente, le receten medicamentos, como antihistamnicos o cremas corticoesteroides. Limitar  la exposicin a alrgenos. Reconocer situaciones que causan estrs e idear un plan para controlarlo. Si la dermatitis atpica no mejora con medicamentos o si est presente en todo el cuerpo (diseminada), puede utilizarse un tratamiento con un tipo de luz especfico (fototerapia). Siga estas instrucciones en su casa: Cuidado de la piel  Mantenga la piel bien humectada. Al hacerlo, quedar hmeda y ayudar a prevenir la sequedad. Utilice lociones sin perfume que contengan vaselina. Evite las lociones que contienen alcohol o agua. Pueden secar la piel. Tome baos o duchas de corta duracin (menos de 5 minutos) en agua tibia. No use agua caliente. Use jabones suaves y sin perfume para baarse. Evite el jabn y el bao de espuma. Aplique un humectante para la piel inmediatamente despus de un bao o una ducha. No aplique nada sobre la piel sin consultar a su mdico. Instrucciones generales Use o aplquese los medicamentos de venta libre y los recetados solamente como se lo haya indicado el mdico. Vstase con ropa de algodn o mezcla de algodn. Vstase con ropas ligeras, ya que el calor aumenta la picazn. Cuando lave la ropa, enjuguela dos veces para eliminar todo el jabn. Evite cualquier factor desencadenante que pueda causar un brote. Mantenga las uas cortas. Evite rascarse. El rascado hace que la erupcin y la picazn empeoren. Una rotura en la piel por rascarse podra provocar una infeccin cutnea (imptigo). No est cerca de personas que tengan herpes labial o ampollas febriles. Si se produce la infeccin, puede hacer que la dermatitis atpica empeore. Cumpla con todas las visitas de seguimiento. Esto es importante. Comunquese con un mdico si: La picazn le impide dormir. La erupcin empeora o no mejora en el plazo de una semana despus de iniciar   el tratamiento. Tiene fiebre. Aparece un brote despus de estar en contacto con alguien que tiene herpes labial o ampollas  febriles. Solicite ayuda de inmediato si: Tiene pus o costras amarillas en la zona de la erupcin. Resumen La dermatitis atpica ocasiona una erupcin roja y piel seca y escamosa con comezn. El tratamiento se enfoca en controlar la picazn y el rascado, limitar la exposicin a cosas a las que es sensible o alrgico (alrgenos), reconocer situaciones que causan estrs e idear un plan para manejar el estrs. Mantenga la piel bien humectada. Tome baos o duchas de menos de 5 minutos y use agua tibia. No use agua caliente. Esta informacin no tiene como fin reemplazar el consejo del mdico. Asegrese de hacerle al mdico cualquier pregunta que tenga. Document Revised: 03/28/2020 Document Reviewed: 03/28/2020 Elsevier Patient Education  2023 Elsevier Inc.  

## 2021-11-27 NOTE — Progress Notes (Signed)
Subjective:  Patient ID: Andrew Sandoval, male    DOB: 09-06-1980  Age: 41 y.o. MRN: [id number]  CC: Rash   HPI Andrew Sandoval is a 41 y.o. year old male with a history of hyperlipidemia, bilateral carpal tunnel syndrome .  Interval History: He broke out in a rash in his L under arm a month ago which is pruritic and it is also present in his R cubital fossa and R underarm. He has no rash in his legs. Itching is worse at night. His wife has not had any itching. He denies introduction of new soaps or creams and has not changed his detergent lately.  He endorses cutting his grass every 2 weeks.  He remains on his statin and he is doing well on it.  Also requests refill of his Zyrtec and Flonase.  He no longer needs gabapentin which was prescribed in the past for his carpal tunnel syndrome. Past Medical History:  Diagnosis Date   Cocaine use 2006-2010   Hyperlipidemia 06/09/2017    Past Surgical History:  Procedure Laterality Date   No prior surgery      Family History  Problem Relation Age of Onset   Diabetes Neg Hx     Social History   Socioeconomic History   Marital status: Single    Spouse name: Not on file   Number of children: Not on file   Years of education: Not on file   Highest education level: Not on file  Occupational History    Comment: [employer]  Tobacco Use   Smoking status: Former   Smokeless tobacco: Never   Tobacco comments:    quit 12 years ago  Vaping Use   Vaping Use: Never used  Substance and Sexual Activity   Alcohol use: No    Alcohol/week: 0.0 standard drinks of alcohol    Comment: quit 12 years ago   Drug use: Not Currently    Types: Cocaine, Marijuana    Comment: Prior cocaine; none in 11 years   Sexual activity: Yes  Other Topics Concern   Not on file  Social History Narrative   Not on file   Social Determinants of Health   Financial Resource Strain: Not on file  Food Insecurity: Not on file  Transportation Needs: Not  on file  Physical Activity: Not on file  Stress: Not on file  Social Connections: Not on file    Allergies  Allergen Reactions   Tylenol [Acetaminophen] Hives    Outpatient Medications Prior to Visit  Medication Sig Dispense Refill   hydrocortisone 2.5 % cream Apply topically 2 (two) times daily. 30 g 0   atorvastatin (LIPITOR) 40 MG tablet TAKE 1 TABLET (40 MG TOTAL) BY MOUTH DAILY. 30 tablet 6   fluticasone (FLONASE) 50 MCG/ACT nasal spray Place 2 sprays into both nostrils daily. 16 g 12   gabapentin (NEURONTIN) 300 MG capsule TAKE 1 CAPSULE (300 MG TOTAL) BY MOUTH AT BEDTIME. 30 capsule 2   benzonatate (TESSALON) 100 MG capsule Take 1-2 capsules (100-200 mg total) by mouth 3 (three) times daily as needed for cough. (Patient not taking: Reported on 08/01/2021) 60 capsule 0   carbamide peroxide (DEBROX) 6.5 % OTIC solution Place 5 drops into the right ear 2 (two) times daily. (Patient not taking: Reported on 11/27/2021) 15 mL 1   cetirizine (ZYRTEC ALLERGY) 10 MG tablet Take 1 tablet (10 mg total) by mouth daily. (Patient not taking: Reported on 11/27/2021) 30 tablet 0   diclofenac (  VOLTAREN) 75 MG EC tablet Take 1 tablet (75 mg total) by mouth 2 (two) times daily as needed. (Patient not taking: Reported on 11/27/2021) 60 tablet 2   meloxicam (MOBIC) 7.5 MG tablet Take 1 tablet (7.5 mg total) by mouth 2 (two) times daily as needed for pain. (Patient not taking: Reported on 11/27/2021) 30 tablet 2   predniSONE (DELTASONE) 20 MG tablet Take 1 tablet (20 mg total) by mouth daily with breakfast. (Patient not taking: Reported on 11/27/2021) 5 tablet 0   promethazine-dextromethorphan (PROMETHAZINE-DM) 6.25-15 MG/5ML syrup Take 5 mLs by mouth at bedtime as needed for cough. (Patient not taking: Reported on 11/27/2021) 100 mL 0   pseudoephedrine (SUDAFED) 60 MG tablet Take 1 tablet (60 mg total) by mouth every 8 (eight) hours as needed for congestion. (Patient not taking: Reported on 11/27/2021) 30 tablet 0    No facility-administered medications prior to visit.     ROS Review of Systems  Constitutional:  Negative for activity change and appetite change.  HENT:  Negative for sinus pressure and sore throat.   Eyes:  Negative for visual disturbance.  Respiratory:  Negative for cough, chest tightness and shortness of breath.   Cardiovascular:  Negative for chest pain and leg swelling.  Gastrointestinal:  Negative for abdominal distention, abdominal pain, constipation and diarrhea.  Endocrine: Negative.   Genitourinary:  Negative for dysuria.  Musculoskeletal:  Negative for joint swelling and myalgias.  Skin:  Positive for rash.  Allergic/Immunologic: Negative.   Neurological:  Negative for weakness, light-headedness and numbness.  Psychiatric/Behavioral:  Negative for dysphoric mood and suicidal ideas.     Objective:  BP 124/81   Pulse (!) 59   Temp 97.8 F (36.6 C) (Oral)   Ht '5\' 2"'  (1.575 m)   Wt 173 lb (78.5 kg)   SpO2 97%   BMI 31.64 kg/m      11/27/2021   10:31 AM 10/31/2021    1:12 PM 08/01/2021    8:59 AM  BP/Weight  Systolic BP 681 275 170  Diastolic BP 81 75 85  Wt. (Lbs) 173  174  BMI 31.64 kg/m2  31.83 kg/m2      Physical Exam Constitutional:      Appearance: He is well-developed.  Cardiovascular:     Rate and Rhythm: Bradycardia present.     Heart sounds: Normal heart sounds. No murmur heard. Pulmonary:     Effort: Pulmonary effort is normal.     Breath sounds: Normal breath sounds. No wheezing or rales.  Chest:     Chest wall: No tenderness.  Abdominal:     General: Bowel sounds are normal. There is no distension.     Palpations: Abdomen is soft. There is no mass.     Tenderness: There is no abdominal tenderness.  Musculoskeletal:        General: Normal range of motion.     Right lower leg: No edema.     Left lower leg: No edema.  Skin:    Comments: Erythematous cluster of pinpoint rash in bilateral upper underarm, also present on lateral aspect  of right thorax and right cubital.  Neurological:     Mental Status: He is alert and oriented to person, place, and time.  Psychiatric:        Mood and Affect: Mood normal.        Latest Ref Rng & Units 06/20/2021   10:24 AM 11/22/2020    9:39 AM 07/04/2020    9:05 AM  CMP  Glucose 70 - 99 mg/dL 102  97  97   BUN 6 - 24 mg/dL '20  15  11   ' Creatinine 0.76 - 1.27 mg/dL 0.94  0.97  0.91   Sodium 134 - 144 mmol/L 143  140  138   Potassium 3.5 - 5.2 mmol/L 4.2  4.4  3.7   Chloride 96 - 106 mmol/L 107  105  107   CO2 20 - 29 mmol/L '21  23  20   ' Calcium 8.7 - 10.2 mg/dL 9.2  9.4  8.9   Total Protein 6.0 - 8.5 g/dL 7.1  7.2    Total Bilirubin 0.0 - 1.2 mg/dL 0.5  0.6    Alkaline Phos 44 - 121 IU/L 124  120    AST 0 - 40 IU/L 27  30    ALT 0 - 44 IU/L 44  60      Lipid Panel     Component Value Date/Time   CHOL 163 06/20/2021 1024   TRIG 111 06/20/2021 1024   HDL 36 (L) 06/20/2021 1024   CHOLHDL 4.5 06/20/2021 1024   LDLCALC 107 (H) 06/20/2021 1024    CBC    Component Value Date/Time   WBC 5.2 07/04/2020 0905   RBC 5.48 07/04/2020 0905   HGB 16.9 07/04/2020 0905   HGB 16.3 06/06/2017 1157   HCT 50.3 07/04/2020 0905   HCT 48.5 06/06/2017 1157   PLT 283 07/04/2020 0905   PLT 303 06/06/2017 1157   MCV 91.8 07/04/2020 0905   MCV 91 06/06/2017 1157   MCH 30.8 07/04/2020 0905   MCHC 33.6 07/04/2020 0905   RDW 12.7 07/04/2020 0905   RDW 13.6 06/06/2017 1157   LYMPHSABS 2.1 06/06/2017 1157   MONOABS 0.4 04/18/2009 1420   EOSABS 0.1 06/06/2017 1157   BASOSABS 0.0 06/06/2017 1157    Lab Results  Component Value Date   HGBA1C 5.4 09/25/2016    Assessment & Plan:  1. Pure hypercholesterolemia Control Low-cholesterol diet - CMP14+EGFR - LP+Non-HDL Cholesterol - atorvastatin (LIPITOR) 40 MG tablet; TAKE 1 TABLET (40 MG TOTAL) BY MOUTH DAILY.  Dispense: 30 tablet; Refill: 6  2. Dermatitis Unknown etiology - triamcinolone cream (KENALOG) 0.1 %; Apply 1 Application  topically 2 (two) times daily.  Dispense: 80 g; Refill: 0  3. Other chronic sinusitis Stable - cetirizine (ZYRTEC ALLERGY) 10 MG tablet; Take 1 tablet (10 mg total) by mouth daily.  Dispense: 30 tablet; Refill: 12 - fluticasone (FLONASE) 50 MCG/ACT nasal spray; Place 2 sprays into both nostrils daily.  Dispense: 16 g; Refill: 12    Meds ordered this encounter  Medications   triamcinolone cream (KENALOG) 0.1 %    Sig: Apply 1 Application topically 2 (two) times daily.    Dispense:  80 g    Refill:  0   atorvastatin (LIPITOR) 40 MG tablet    Sig: TAKE 1 TABLET (40 MG TOTAL) BY MOUTH DAILY.    Dispense:  30 tablet    Refill:  6   cetirizine (ZYRTEC ALLERGY) 10 MG tablet    Sig: Take 1 tablet (10 mg total) by mouth daily.    Dispense:  30 tablet    Refill:  12   fluticasone (FLONASE) 50 MCG/ACT nasal spray    Sig: Place 2 sprays into both nostrils daily.    Dispense:  16 g    Refill:  12    Follow-up: Return in about 6 months (around 05/29/2022) for Chronic medical conditions.  Charlott Rakes, MD, FAAFP. [facility] and [place], [place]   11/27/2021, 12:11 PM

## 2021-11-27 NOTE — Progress Notes (Signed)
Rash on arms and back

## 2021-11-28 LAB — LP+NON-HDL CHOLESTEROL
Cholesterol, Total: 231 mg/dL — ABNORMAL HIGH (ref 100–199)
HDL: 46 mg/dL (ref >39)
LDL Chol Calc (NIH): 149 mg/dL — ABNORMAL HIGH (ref 0–99)
Total Non-HDL-Chol (LDL+VLDL): 185 mg/dL — ABNORMAL HIGH (ref 0–129)
Triglycerides: 197 mg/dL — ABNORMAL HIGH (ref 0–149)
VLDL Cholesterol Cal: 36 mg/dL (ref 5–40)

## 2021-11-28 LAB — CMP14+EGFR
ALT: 75 IU/L — ABNORMAL HIGH (ref 0–44)
AST: 34 IU/L (ref 0–40)
Albumin/Globulin Ratio: 1.6 (ref 1.2–2.2)
Albumin: 4.7 g/dL (ref 4.0–5.0)
Alkaline Phosphatase: 119 IU/L (ref 44–121)
BUN/Creatinine Ratio: 13 (ref 9–20)
BUN: 14 mg/dL (ref 6–24)
Bilirubin Total: 1.4 mg/dL — ABNORMAL HIGH (ref 0.0–1.2)
CO2: 25 mmol/L (ref 20–29)
Calcium: 9.7 mg/dL (ref 8.7–10.2)
Chloride: 103 mmol/L (ref 96–106)
Creatinine, Ser: 1.07 mg/dL (ref 0.76–1.27)
Globulin, Total: 3 g/dL (ref 1.5–4.5)
Glucose: 101 mg/dL — ABNORMAL HIGH (ref 70–99)
Potassium: 4.2 mmol/L (ref 3.5–5.2)
Sodium: 140 mmol/L (ref 134–144)
Total Protein: 7.7 g/dL (ref 6.0–8.5)
eGFR: 89 mL/min/{1.73_m2} (ref >59)

## 2022-01-02 ENCOUNTER — Other Ambulatory Visit: Payer: Self-pay

## 2022-01-02 ENCOUNTER — Other Ambulatory Visit: Payer: Self-pay | Admitting: Physician Assistant

## 2022-01-03 ENCOUNTER — Ambulatory Visit: Payer: Self-pay | Admitting: *Deleted

## 2022-01-03 NOTE — Telephone Encounter (Signed)
Reason for Disposition  Earache  (Exceptions: brief ear pain of < 60 minutes duration, earache occurring during air travel  Answer Assessment - Initial Assessment Questions 1. LOCATION: "Which ear is involved?"     Having ear pain on left side  Called in with Spanish interpreter 2. ONSET: "When did the ear start hurting"      10 months ago They don't have a dr. Who can check me when asked if a dr. Had evaluated him.   2 yrs ago treatment with ENT.  That dr. Is not there anymore.   1 yr ago I stopped treatments.    Asked what kind of treatments he was getting.   Tympanic membrane blown out.   It happened when I was taking a shower.   It was 5 yrs ago it happened.   Now I can't hear anything from left ear.  Feels like it's opening and closing. I'm having pain.   I hear a lot of noise when it opens.   3. SEVERITY: "How bad is the pain?"  (Scale 1-10; mild, moderate or severe)   - MILD (1-3): doesn't interfere with normal activities    - MODERATE (4-7): interferes with normal activities or awakens from sleep    - SEVERE (8-10): excruciating pain, unable to do any normal activities      moderate 4. URI SYMPTOMS: "Do you have a runny nose or cough?"     Not asked 5. FEVER: "Do you have a fever?" If Yes, ask: "What is your temperature, how was it measured, and when did it start?"     Not asked 6. CAUSE: "Have you been swimming recently?", "How often do you use Q-TIPS?", "Have you had any recent air travel or scuba diving?"     No   7. OTHER SYMPTOMS: "Do you have any other symptoms?" (e.g., headache, stiff neck, dizziness, vomiting, runny nose, decreased hearing)     No 8. PREGNANCY: "Is there any chance you are pregnant?" "When was your last menstrual period?"     N/A  Protocols used: Davina Poke

## 2022-01-03 NOTE — Telephone Encounter (Signed)
  Chief Complaint: Left ear pain Symptoms: Left ear pain, feels like it is opening and closing, hears noise inside his ear. Frequency: For last 10 months.   Was under care of ENT but dr. No longer works there.   Pertinent Negatives: Patient denies runny nose, sore throat or coughing Disposition: [] ED /[] Urgent Care (no appt availability in office) / [x] Appointment(In office/virtual)/ []  [facility]/ [] Home Care/ [] Refused Recommended Disposition /[] [facility]/ []  Follow-up with PCP Additional Notes: Appt made for Saturday Oct. 28, 2023 at 9:00 with , FNP.   Instructed to go to urgent care if ear becomes worse.     Spanish interpreter was [phone]

## 2022-01-23 ENCOUNTER — Other Ambulatory Visit: Payer: Self-pay | Admitting: Family Medicine

## 2022-01-23 ENCOUNTER — Other Ambulatory Visit: Payer: Self-pay

## 2022-01-23 DIAGNOSIS — E7800 Pure hypercholesterolemia, unspecified: Secondary | ICD-10-CM

## 2022-01-24 ENCOUNTER — Other Ambulatory Visit: Payer: Self-pay

## 2022-01-24 MED ORDER — ATORVASTATIN CALCIUM 40 MG PO TABS
ORAL_TABLET | Freq: Every day | ORAL | 1 refills | Status: DC
  Filled 2022-01-24: qty 30, 30d supply, fill #0
  Filled 2022-03-15: qty 30, 30d supply, fill #1

## 2022-02-26 ENCOUNTER — Ambulatory Visit: Payer: Self-pay | Admitting: Family Medicine

## 2022-03-15 ENCOUNTER — Other Ambulatory Visit: Payer: Self-pay

## 2022-03-20 ENCOUNTER — Other Ambulatory Visit: Payer: Self-pay

## 2022-03-20 ENCOUNTER — Ambulatory Visit: Payer: Self-pay | Attending: Family | Admitting: Family Medicine

## 2022-03-20 VITALS — BP 129/83 | HR 57 | Temp 98.1°F | Ht 62.0 in | Wt 174.0 lb

## 2022-03-20 DIAGNOSIS — A084 Viral intestinal infection, unspecified: Secondary | ICD-10-CM

## 2022-03-20 DIAGNOSIS — B0222 Postherpetic trigeminal neuralgia: Secondary | ICD-10-CM

## 2022-03-20 DIAGNOSIS — L309 Dermatitis, unspecified: Secondary | ICD-10-CM

## 2022-03-20 DIAGNOSIS — H6532 Chronic mucoid otitis media, left ear: Secondary | ICD-10-CM

## 2022-03-20 MED ORDER — CIPROFLOXACIN HCL 500 MG PO TABS
500.0000 mg | ORAL_TABLET | Freq: Two times a day (BID) | ORAL | 0 refills | Status: AC
  Filled 2022-03-20: qty 6, 3d supply, fill #0

## 2022-03-20 MED ORDER — GABAPENTIN 300 MG PO CAPS
300.0000 mg | ORAL_CAPSULE | Freq: Two times a day (BID) | ORAL | 1 refills | Status: DC
  Filled 2022-03-20: qty 60, 30d supply, fill #0
  Filled 2022-08-21: qty 60, 30d supply, fill #1

## 2022-03-20 MED ORDER — BETAMETHASONE DIPROPIONATE 0.05 % EX CREA
TOPICAL_CREAM | Freq: Two times a day (BID) | CUTANEOUS | 1 refills | Status: DC
  Filled 2022-03-20: qty 45, 60d supply, fill #0
  Filled 2022-11-05: qty 45, 50d supply, fill #1
  Filled 2022-11-05: qty 45, 60d supply, fill #1

## 2022-03-20 MED ORDER — AMOXICILLIN-POT CLAVULANATE 875-125 MG PO TABS
1.0000 | ORAL_TABLET | Freq: Two times a day (BID) | ORAL | 0 refills | Status: DC
  Filled 2022-03-20: qty 20, 10d supply, fill #0

## 2022-03-20 NOTE — Progress Notes (Signed)
Subjective:  Patient ID: Andrew Sandoval, male    DOB: August 10, 1980  Age: 41 y.o. MRN: [id number]  CC: Ear Pain (L ear pain and odor X3 days, vomiting, diarrhea, fever dizziness on monday/Rash on R & L feet. Rash on R arm, numbness for X1 month)   HPI Andrew Sandoval is a 41 y.o. year old male with a history of hyperlipidemia, bilateral carpal tunnel syndrome here for an office visit.  Interval History: For the last 2 months  he has had left ear pain in both inner and outer ear with associated decreased hearing but no fever or URI sxs. He also noticed drainage which is yellowish with an odor to it. He did have vomiting, diarrhea, fever, dizziness 2 days ago which resolved but states he still 'has an upset stomach'.  He has had a rash in his right arm x3 weeks and associated numbness. Rash is not pruritic just erythematous but when he wears a longsleeve shirt he has discomfort over his right arm He has a chronic rash in his feet which is not responding to Triamcinolone Past Medical History:  Diagnosis Date   Cocaine use 2006-2010   Hyperlipidemia 06/09/2017    Past Surgical History:  Procedure Laterality Date   No prior surgery      Family History  Problem Relation Age of Onset   Diabetes Neg Hx     Social History   Socioeconomic History   Marital status: Single    Spouse name: Not on file   Number of children: Not on file   Years of education: Not on file   Highest education level: Not on file  Occupational History    Comment: [employer]  Tobacco Use   Smoking status: Former   Smokeless tobacco: Never   Tobacco comments:    quit 12 years ago  Vaping Use   Vaping Use: Never used  Substance and Sexual Activity   Alcohol use: No    Alcohol/week: 0.0 standard drinks of alcohol    Comment: quit 12 years ago   Drug use: Not Currently    Types: Cocaine, Marijuana    Comment: Prior cocaine; none in 11 years   Sexual activity: Yes  Other Topics Concern   Not on  file  Social History Narrative   Not on file   Social Determinants of Health   Financial Resource Strain: Not on file  Food Insecurity: Not on file  Transportation Needs: Not on file  Physical Activity: Not on file  Stress: Not on file  Social Connections: Not on file    Allergies  Allergen Reactions   Tylenol [Acetaminophen] Hives    Outpatient Medications Prior to Visit  Medication Sig Dispense Refill   atorvastatin (LIPITOR) 40 MG tablet TAKE 1 TABLET (40 MG TOTAL) BY MOUTH DAILY. 30 tablet 1   cetirizine (ZYRTEC ALLERGY) 10 MG tablet Take 1 tablet (10 mg total) by mouth daily. 30 tablet 12   fluticasone (FLONASE) 50 MCG/ACT nasal spray Place 2 sprays into both nostrils daily. 16 g 12   hydrocortisone 2.5 % cream Apply topically 2 (two) times daily. 30 g 0   triamcinolone cream (KENALOG) 0.1 % Apply 1 Application topically 2 (two) times daily. 80 g 0   No facility-administered medications prior to visit.     ROS Review of Systems  Constitutional:  Negative for activity change and appetite change.  HENT:  Positive for ear discharge, ear pain and hearing loss. Negative for sinus pressure and sore  throat.   Respiratory:  Negative for chest tightness, shortness of breath and wheezing.   Cardiovascular:  Negative for chest pain and palpitations.  Gastrointestinal:        See HPI  Genitourinary: Negative.   Musculoskeletal: Negative.   Skin:        See HPI  Neurological:  Positive for numbness.  Psychiatric/Behavioral:  Negative for behavioral problems and dysphoric mood.     Objective:  BP 129/83 (BP Location: Left Arm, Patient Position: Sitting, Cuff Size: Normal)   Pulse (!) 57   Temp 98.1 F (36.7 C) (Oral)   Ht 5\' 2"  (1.575 m)   Wt 174 lb (78.9 kg)   SpO2 97%   BMI 31.83 kg/m      03/20/2022    1:56 PM 11/27/2021   10:31 AM 10/31/2021    1:12 PM  BP/Weight  Systolic BP [id number]  Diastolic BP 83 81 75  Wt. (Lbs) 174 173   BMI 31.83 kg/m2 31.64  kg/m2       Physical Exam Constitutional:      Appearance: He is well-developed.  Cardiovascular:     Rate and Rhythm: Bradycardia present.     Heart sounds: Normal heart sounds. No murmur heard. Pulmonary:     Effort: Pulmonary effort is normal.     Breath sounds: Normal breath sounds. No wheezing or rales.  Chest:     Chest wall: No tenderness.  Abdominal:     General: Bowel sounds are normal. There is no distension.     Palpations: Abdomen is soft. There is no mass.     Tenderness: There is no abdominal tenderness.  Musculoskeletal:        General: Normal range of motion.     Right lower leg: No edema.     Left lower leg: No edema.  Skin:    Comments: Lateral aspect of right cubital fossa with erythematous rash, no vesicles extending to forearm.  Dry scaly rash on dorsum of both feet  Neurological:     Mental Status: He is alert and oriented to person, place, and time.  Psychiatric:        Mood and Affect: Mood normal.        Latest Ref Rng & Units 11/27/2021   11:18 AM 06/20/2021   10:24 AM 11/22/2020    9:39 AM  CMP  Glucose 70 - 99 mg/dL 101  102  97   BUN 6 - 24 mg/dL 14  20  15    Creatinine 0.76 - 1.27 mg/dL 1.07  0.94  0.97   Sodium 134 - 144 mmol/L 140  143  140   Potassium 3.5 - 5.2 mmol/L 4.2  4.2  4.4   Chloride 96 - 106 mmol/L 103  107  105   CO2 20 - 29 mmol/L 25  21  23    Calcium 8.7 - 10.2 mg/dL 9.7  9.2  9.4   Total Protein 6.0 - 8.5 g/dL 7.7  7.1  7.2   Total Bilirubin 0.0 - 1.2 mg/dL 1.4  0.5  0.6   Alkaline Phos 44 - 121 IU/L 119  124  120   AST 0 - 40 IU/L 34  27  30   ALT 0 - 44 IU/L 75  44  60     Lipid Panel     Component Value Date/Time   CHOL 231 (H) 11/27/2021 1118   TRIG 197 (H) 11/27/2021 1118   HDL 46 11/27/2021 1118  CHOLHDL 4.5 06/20/2021 1024   LDLCALC 149 (H) 11/27/2021 1118    CBC    Component Value Date/Time   WBC 5.2 07/04/2020 0905   RBC 5.48 07/04/2020 0905   HGB 16.9 07/04/2020 0905   HGB 16.3 06/06/2017  1157   HCT 50.3 07/04/2020 0905   HCT 48.5 06/06/2017 1157   PLT 283 07/04/2020 0905   PLT 303 06/06/2017 1157   MCV 91.8 07/04/2020 0905   MCV 91 06/06/2017 1157   MCH 30.8 07/04/2020 0905   MCHC 33.6 07/04/2020 0905   RDW 12.7 07/04/2020 0905   RDW 13.6 06/06/2017 1157   LYMPHSABS 2.1 06/06/2017 1157   MONOABS 0.4 04/18/2009 1420   EOSABS 0.1 06/06/2017 1157   BASOSABS 0.0 06/06/2017 1157    Lab Results  Component Value Date   HGBA1C 5.4 09/25/2016    Assessment & Plan:  1. Dermatitis Patient this has been biopsied by dermatology and he was informed nothing could be done Triamcinolone has been ineffective We will switch to hypotensively steroid - betamethasone dipropionate 0.05 % cream; Apply topically 2 (two) times daily.  Dispense: 45 g; Refill: 1  2. 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  Dispense: 6 tablet; Refill: 0 - amoxicillin-clavulanate (AUGMENTIN) 875-125 MG tablet; Take 1 tablet by mouth 2 (two) times daily.  Dispense: 20 tablet; Refill: 0  3. Trigeminal herpes zoster Discussed pathophysiology of shingles Due to prolonged nature of symptoms antiviral medication not indicated at this time Replaced on gabapentin - gabapentin (NEURONTIN) 300 MG capsule; Take 1 capsule (300 mg total) by mouth 2 (two) times daily.  Dispense: 60 capsule; Refill: 1  4. Viral gastroenteritis Symptoms have resolved We will check electrolytes - CBC with Differential/Platelet - Basic Metabolic Panel    Meds ordered this encounter  Medications   gabapentin (NEURONTIN) 300 MG capsule    Sig: Take 1 capsule (300 mg total) by mouth 2 (two) times daily.    Dispense:  60 capsule    Refill:  1   ciprofloxacin (CIPRO) 500 MG tablet    Sig: Take 1 tablet (500 mg total) by mouth 2 (two) times daily for 3 days.    Dispense:  6 tablet    Refill:  0   amoxicillin-clavulanate  (AUGMENTIN) 875-125 MG tablet    Sig: Take 1 tablet by mouth 2 (two) times daily.    Dispense:  20 tablet    Refill:  0   betamethasone dipropionate 0.05 % cream    Sig: Apply topically 2 (two) times daily.    Dispense:  45 g    Refill:  1    Follow-up: Return for previously scheduled appointment.       Charlott Rakes, MD, FAAFP. [facility] and [facility], [place]   03/20/2022, 6:13 PM

## 2022-03-20 NOTE — Patient Instructions (Signed)
Culebrilla Shingles  La culebrilla, tambin conocida como herpes zster, es una infeccin que causa una erupcin cutnea dolorosa y ampollas llenas de lquido. La causa un virus. La culebrilla solo se desarrolla en personas que: Han tenido varicela. Se han vacunado contra la varicela. La culebrilla es poco frecuente en ese grupo. Cules son las causas? El virus de la varicela zster ocasiona la culebrilla. Este es el mismo virus que causa la varicela. Despus de la exposicin al virus, este permanece en el cuerpo en un estado inactivo (latente). La culebrilla se desarrolla si el virus se reactiva. Esto puede ocurrir muchos aos despus de la primera exposicin (inicial) al virus. No se sabe qu causa la reactivacin de este virus. Qu incrementa el riesgo? Las personas que han tenido varicela o han recibido la vacuna contra la varicela, estn en riesgo de tener culebrilla. La infeccin de la culebrilla es ms frecuente en las personas que: Son mayores de 60 aos de edad. Tienen debilitado el sistema que combate las enfermedades (sistema inmunitario), por ejemplo, las personas que tienen: VIH (virus de inmunodeficiencia humana). SIDA (sndrome de inmunodeficiencia adquirida). Cncer. Reciben medicamentos que debilitan el sistema inmunitario, como los medicamentos que se indican en caso de trasplantes de rganos. Est experimentando mucho estrs. Cules son los signos o sntomas? Los sntomas tempranos de esta afeccin incluyen picazn, hormigueo y dolor en un rea de la piel. Este dolor se puede describir como ardor, punzante o pulstil. Unos das o semanas despus de que comienzan los primeros sntomas, aparece una erupcin cutnea rojiza y dolorosa. La erupcin generalmente aparece en un lado del cuerpo en un patrn de bandas o semejante a una franja. Con el tiempo, la erupcin cutnea se convierte en ampollas llenas de lquido que se rompen, se transforman en costras y se secan en el trmino  de 2 a 3 semanas. En cualquier momento durante la infeccin, usted tambin puede presentar: Fiebre. Escalofros. Dolor de cabeza. Nuseas. Cmo se diagnostica? Esta afeccin se diagnostica con un examen cutneo. Es posible que se extraigan muestras de piel o lquido (un cultivo) de las ampollas antes de realizar un diagnstico. Cmo se trata? La erupcin cutnea puede durar varias semanas. No hay una cura especfica para esta afeccin. Es posible que el mdico le recete medicamentos para ayudarlo a controlar el dolor, recuperarse ms rpidamente y evitar problemas a largo plazo. Los medicamentos pueden incluir los siguientes: Medicamentos antivirales. Medicamentos antiinflamatorios. Analgsicos. Medicamentos para aliviar la picazn (antihistamnicos). Si la zona afectada est en el rostro, se lo puede derivar a un especialista, como un mdico especialista en ojos (oftalmlogo) o en nariz, garganta y odos (otorrinolaringlogo) para ayudarlo a evitar problemas oculares, dolor crnico o discapacidad. Siga estas instrucciones en su casa: Medicamentos Use los medicamentos de venta libre y los recetados solamente como se lo haya indicado el mdico. Aplique una crema para calmar la picazn o cremas anestsicas en la zona afectada segn las indicaciones del mdico. Para aliviar la picazn y las molestias  Aplique paos hmedos y fros (compresas fras) sobre la zona de la erupcin cutnea o las ampollas siguiendo las indicaciones del mdico. Los baos con agua fra pueden brindar alivio. Pruebe agregar bicarbonato de sodio o avena seca en el agua para reducir la picazn. No se bae con agua caliente. Use locin de calamina segn se lo haya recomendado el mdico. Se trata de una locin de venta libre que ayuda a aliviar la picazn. Cuidado de las ampollas y la erupcin cutnea Mantenga la zona   de la erupcin cutnea cubierta con una venda floja (vendaje). Use ropa holgada para ayudar a aliviar el  dolor provocado por el roce con la erupcin. Lvese las manos con agua y jabn durante al menos 20 segundos antes y despus de cambiar el vendaje. Use desinfectante para manos si no dispone de agua y jabn. Cambie el vendaje como se lo haya indicado el mdico. Mantenga la erupcin y las ampollas limpias lavando la zona con jabn suave y agua fra segn las indicaciones del mdico. Verifique la erupcin cutnea todos los das para detectar signos de infeccin. Est atento a los siguientes signos: Aumento del enrojecimiento, la hinchazn o el dolor. Lquido o sangre. Calor. Pus o mal olor. No se rasque en la zona de la erupcin ni se toque las ampollas. Para ayudar a evitar rascarse: Tenga las uas siempre cortas y limpias. Use guantes o mitones mientras duerme, si no puede dejar de rascarse. Instrucciones generales Haga reposo como se lo haya indicado el mdico. Lvese las manos regularmente con agua y jabn durante al menos 20 segundos. Use desinfectante para manos si no dispone de agua y jabn. Al hacerlo, reduce sus probabilidades de contraer una infeccin bacteriana en la piel. Antes de que se forme una costra sobre las ampollas, la infeccin de la culebrilla puede causar varicela en las personas que nunca la tuvieron o nunca se vacunaron contra la varicela. Para impedir que esto ocurra, evite el contacto con otras personas, especialmente: Los bebs. Las mujeres embarazadas. Los nios con eczema. Las personas de edad avanzada que tienen trasplantes. Las personas con enfermedades crnicas, como cncer o sndrome de inmunodeficiencia adquirida (SIDA). Concurra a todas las visitas de seguimiento. Esto es importante. Cmo se evita? Vacunarse es la mejor forma de prevenir la culebrilla y de protegerse contra las complicaciones de la culebrilla. Si no se ha vacunado, hable con su mdico sobre la posibilidad de recibir la vacuna. Dnde buscar ms informacin Centers for Disease Control and  Prevention (Centros para el Control y la Prevencin de Enfermedades): www.cdc.gov Comunquese con un mdico si: El dolor no se alivia con los medicamentos que le recetaron. El dolor no mejora despus de que se cura la erupcin cutnea. Tiene cualquiera de estos signos de infeccin: Aumento del enrojecimiento, la hinchazn o el dolor alrededor de la erupcin. Presenta lquido o sangre que supura de la erupcin. Calor que proviene de la erupcin. Advierte pus o mal olor que sale de la erupcin. Fiebre. Solicite ayuda de inmediato si: La erupcin cutnea est en el rostro o en la nariz. Tiene dolor en el rostro, en la zona de los ojos o presenta prdida de sensibilidad en un lado del rostro. Tiene dificultad para ver. Siente dolor o zumbido en los odos. Presenta prdida del gusto. Su afeccin empeora. Resumen La culebrilla, que tambin se conoce como herpes zster, es una infeccin que causa una erupcin cutnea dolorosa y ampollas llenas de lquido. Esta afeccin se diagnostica con un examen cutneo. Es posible que se extraigan muestras de piel o lquido (un cultivo) de las ampollas. Mantenga la zona de la erupcin cutnea cubierta con una venda floja (vendaje). Use ropa holgada para ayudar a aliviar el dolor provocado por el roce con la erupcin. Antes de que se forme una costra sobre las ampollas, la infeccin de la culebrilla puede causar varicela en las personas que nunca la tuvieron o nunca se vacunaron contra la varicela. Esta informacin no tiene como fin reemplazar el consejo del mdico. Asegrese de hacerle   al mdico cualquier pregunta que tenga. Document Revised: 06/16/2020 Document Reviewed: 06/16/2020 Elsevier Patient Education  2023 Elsevier Inc.  

## 2022-03-21 LAB — CBC WITH DIFFERENTIAL/PLATELET
Basophils Absolute: 0 10*3/uL (ref 0.0–0.2)
Basos: 0 %
EOS (ABSOLUTE): 0.1 10*3/uL (ref 0.0–0.4)
Eos: 2 %
Hematocrit: 47.3 % (ref 37.5–51.0)
Hemoglobin: 16.3 g/dL (ref 13.0–17.7)
Immature Grans (Abs): 0 10*3/uL (ref 0.0–0.1)
Immature Granulocytes: 0 %
Lymphocytes Absolute: 1.5 10*3/uL (ref 0.7–3.1)
Lymphs: 36 %
MCH: 31.3 pg (ref 26.6–33.0)
MCHC: 34.5 g/dL (ref 31.5–35.7)
MCV: 91 fL (ref 79–97)
Monocytes Absolute: 0.6 10*3/uL (ref 0.1–0.9)
Monocytes: 14 %
Neutrophils Absolute: 2 10*3/uL (ref 1.4–7.0)
Neutrophils: 48 %
Platelets: 269 10*3/uL (ref 150–450)
RBC: 5.2 x10E6/uL (ref 4.14–5.80)
RDW: 12.5 % (ref 11.6–15.4)
WBC: 4.2 10*3/uL (ref 3.4–10.8)

## 2022-03-21 LAB — BASIC METABOLIC PANEL
BUN/Creatinine Ratio: 11 (ref 9–20)
BUN: 10 mg/dL (ref 6–24)
CO2: 23 mmol/L (ref 20–29)
Calcium: 9.1 mg/dL (ref 8.7–10.2)
Chloride: 104 mmol/L (ref 96–106)
Creatinine, Ser: 0.88 mg/dL (ref 0.76–1.27)
Glucose: 87 mg/dL (ref 70–99)
Potassium: 4.1 mmol/L (ref 3.5–5.2)
Sodium: 142 mmol/L (ref 134–144)
eGFR: 110 mL/min/{1.73_m2} (ref >59)

## 2022-04-06 ENCOUNTER — Ambulatory Visit: Payer: Self-pay | Admitting: Family

## 2022-04-23 ENCOUNTER — Other Ambulatory Visit: Payer: Self-pay | Admitting: Family Medicine

## 2022-04-23 ENCOUNTER — Other Ambulatory Visit: Payer: Self-pay

## 2022-04-23 DIAGNOSIS — E7800 Pure hypercholesterolemia, unspecified: Secondary | ICD-10-CM

## 2022-04-23 MED ORDER — ATORVASTATIN CALCIUM 40 MG PO TABS
40.0000 mg | ORAL_TABLET | Freq: Every day | ORAL | 1 refills | Status: DC
  Filled 2022-04-23: qty 90, 90d supply, fill #0
  Filled 2022-08-01 (×2): qty 90, 90d supply, fill #1

## 2022-04-24 ENCOUNTER — Other Ambulatory Visit: Payer: Self-pay

## 2022-05-15 ENCOUNTER — Ambulatory Visit: Payer: Self-pay | Attending: Family Medicine | Admitting: Family Medicine

## 2022-05-15 ENCOUNTER — Encounter: Payer: Self-pay | Admitting: Family Medicine

## 2022-05-15 ENCOUNTER — Other Ambulatory Visit: Payer: Self-pay

## 2022-05-15 VITALS — BP 118/74 | HR 68 | Temp 98.3°F | Ht 62.0 in | Wt 166.4 lb

## 2022-05-15 DIAGNOSIS — E7800 Pure hypercholesterolemia, unspecified: Secondary | ICD-10-CM

## 2022-05-15 DIAGNOSIS — Z131 Encounter for screening for diabetes mellitus: Secondary | ICD-10-CM

## 2022-05-15 DIAGNOSIS — R058 Other specified cough: Secondary | ICD-10-CM

## 2022-05-15 MED ORDER — PREDNISONE 20 MG PO TABS
20.0000 mg | ORAL_TABLET | Freq: Every day | ORAL | 0 refills | Status: DC
  Filled 2022-05-15: qty 5, 5d supply, fill #0

## 2022-05-15 MED ORDER — BENZONATATE 100 MG PO CAPS
100.0000 mg | ORAL_CAPSULE | Freq: Two times a day (BID) | ORAL | 0 refills | Status: DC | PRN
  Filled 2022-05-15: qty 20, 10d supply, fill #0

## 2022-05-15 NOTE — Progress Notes (Signed)
Cough for 2 days

## 2022-05-15 NOTE — Progress Notes (Signed)
Subjective:  Patient ID: Andrew Sandoval, male    DOB: [birth year]  Age: 41 y.o. MRN: [id number]  CC: Cough   HPI Korrey Schleicher is a 41 y.o. year old male with a history of hyperlipidemia, bilateral carpal tunnel syndrome.  Interval History:  For the last 2-3 days he has had cough and has had mucus in his nostril. Cough is dry and he has no wheezing, dyspnea, chest pain. He has been adherent with Flonase, Zyrtec. Denies presence of fever, myalgias and he has had no sick contacts.  Past Medical History:  Diagnosis Date   Cocaine use 2006-2010   Hyperlipidemia 06/09/2017    Past Surgical History:  Procedure Laterality Date   No prior surgery      Family History  Problem Relation Age of Onset   Diabetes Neg Hx     Social History   Socioeconomic History   Marital status: Single    Spouse name: Not on file   Number of children: Not on file   Years of education: Not on file   Highest education level: Not on file  Occupational History    Comment: [employer]  Tobacco Use   Smoking status: Former   Smokeless tobacco: Never   Tobacco comments:    quit 12 years ago  Vaping Use   Vaping Use: Never used  Substance and Sexual Activity   Alcohol use: No    Alcohol/week: 0.0 standard drinks of alcohol    Comment: quit 12 years ago   Drug use: Not Currently    Types: Cocaine, Marijuana    Comment: Prior cocaine; none in 11 years   Sexual activity: Yes  Other Topics Concern   Not on file  Social History Narrative   Not on file   Social Determinants of Health   Financial Resource Strain: Not on file  Food Insecurity: Not on file  Transportation Needs: Not on file  Physical Activity: Not on file  Stress: Not on file  Social Connections: Not on file    Allergies  Allergen Reactions   Tylenol [Acetaminophen] Hives    Outpatient Medications Prior to Visit  Medication Sig Dispense Refill   atorvastatin (LIPITOR) 40 MG tablet Take 1 tablet (40 mg total) by  mouth daily. 90 tablet 1   cetirizine (ZYRTEC ALLERGY) 10 MG tablet Take 1 tablet (10 mg total) by mouth daily. 30 tablet 12   fluticasone (FLONASE) 50 MCG/ACT nasal spray Place 2 sprays into both nostrils daily. 16 g 12   gabapentin (NEURONTIN) 300 MG capsule Take 1 capsule (300 mg total) by mouth 2 (two) times daily. 60 capsule 1   amoxicillin-clavulanate (AUGMENTIN) 875-125 MG tablet Take 1 tablet by mouth 2 (two) times daily. (Patient not taking: Reported on 05/15/2022) 20 tablet 0   betamethasone dipropionate 0.05 % cream Apply topically 2 (two) times daily. (Patient not taking: Reported on 05/15/2022) 45 g 1   No facility-administered medications prior to visit.     ROS Review of Systems  Constitutional:  Negative for activity change and appetite change.  HENT:  Positive for congestion. Negative for sinus pressure and sore throat.   Respiratory:  Positive for cough. Negative for chest tightness, shortness of breath and wheezing.   Cardiovascular:  Negative for chest pain and palpitations.  Gastrointestinal:  Negative for abdominal distention, abdominal pain and constipation.  Genitourinary: Negative.   Musculoskeletal: Negative.   Psychiatric/Behavioral:  Negative for behavioral problems and dysphoric mood.     Objective:  BP  118/74   Pulse 68   Temp 98.3 F (36.8 C) (Oral)   Ht 5\' 2"  (1.575 m)   Wt 166 lb 6.4 oz (75.5 kg)   SpO2 98%   BMI 30.43 kg/m      05/15/2022    9:12 AM 03/20/2022    1:56 PM 11/27/2021   10:31 AM  BP/Weight  Systolic BP 118 129 124  Diastolic BP 74 83 81  Wt. (Lbs) 166.4 174 173  BMI 30.43 kg/m2 31.83 kg/m2 31.64 kg/m2      Physical Exam Constitutional:      Appearance: He is well-developed.  HENT:     Right Ear: Tympanic membrane normal.     Left Ear: Tympanic membrane normal.     Nose: Nose normal.     Mouth/Throat:     Mouth: Mucous membranes are moist.  Cardiovascular:     Rate and Rhythm: Normal rate.     Heart sounds: Normal  heart sounds. No murmur heard. Pulmonary:     Effort: Pulmonary effort is normal.     Breath sounds: Normal breath sounds. No wheezing or rales.  Chest:     Chest wall: No tenderness.  Abdominal:     General: Bowel sounds are normal. There is no distension.     Palpations: Abdomen is soft. There is no mass.     Tenderness: There is no abdominal tenderness.  Musculoskeletal:        General: Normal range of motion.     Right lower leg: No edema.     Left lower leg: No edema.  Neurological:     Mental Status: He is alert and oriented to person, place, and time.  Psychiatric:        Mood and Affect: Mood normal.        Latest Ref Rng & Units 03/20/2022    3:00 PM 11/27/2021   11:18 AM 06/20/2021   10:24 AM  CMP  Glucose 70 - 99 mg/dL 87  08/18/2021  299   BUN 6 - 24 mg/dL 10  14  20    Creatinine 0.76 - 1.27 mg/dL 242   6.83   Sodium 134 - 144 mmol/L 142  140  143   Potassium 3.5 - 5.2 mmol/L 4.1  4.2  4.2   Chloride 96 - 106 mmol/L 104  103  107   CO2 20 - 29 mmol/L 23  25  21    Calcium 8.7 - 10.2 mg/dL 9.1  9.7  9.2   Total Protein 6.0 - 8.5 g/dL  7.7  7.1   Total Bilirubin 0.0 - 1.2 mg/dL  1.4  0.5   Alkaline Phos 44 - 121 IU/L  119  124   AST 0 - 40 IU/L  34  27   ALT 0 - 44 IU/L  75  44     Lipid Panel     Component Value Date/Time   CHOL 231 (H) 11/27/2021 1118   TRIG 197 (H) 11/27/2021 1118   HDL 46 11/27/2021 1118   CHOLHDL 4.5 06/20/2021 1024   LDLCALC 149 (H) 11/27/2021 1118    CBC    Component Value Date/Time   WBC 4.2 03/20/2022 1500   WBC 5.2 07/04/2020 0905   RBC 5.20 03/20/2022 1500   RBC 5.48 07/04/2020 0905   HGB 16.3 03/20/2022 1500   HCT 47.3 03/20/2022 1500   PLT 269 03/20/2022 1500   MCV 91 03/20/2022 1500   MCH 31.3 03/20/2022 1500   MCH  30.8 07/04/2020 0905   MCHC 34.5 03/20/2022 1500   MCHC 33.6 07/04/2020 0905   RDW 12.5 03/20/2022 1500   LYMPHSABS 1.5 03/20/2022 1500   MONOABS 0.4 04/18/2009 1420   EOSABS 0.1 03/20/2022 1500    BASOSABS 0.0 03/20/2022 1500    Lab Results  Component Value Date   HGBA1C 5.4 09/25/2016    Assessment & Plan:  1. Pure hypercholesterolemia Uncontrolled Will check lipid panel and adjust regimen accordingly Low-cholesterol diet - LP+Non-HDL Cholesterol  2. Screening for diabetes mellitus - Hemoglobin A1c  3. Other cough Sinus etiology Continue antihistamine and nasal corticosteroid - benzonatate (TESSALON) 100 MG capsule; Take 1 capsule (100 mg total) by mouth 2 (two) times daily as needed for cough.  Dispense: 20 capsule; Refill: 0 - predniSONE (DELTASONE) 20 MG tablet; Take 1 tablet (20 mg total) by mouth daily with breakfast.  Dispense: 5 tablet; Refill: 0   Meds ordered this encounter  Medications   benzonatate (TESSALON) 100 MG capsule    Sig: Take 1 capsule (100 mg total) by mouth 2 (two) times daily as needed for cough.    Dispense:  20 capsule    Refill:  0   predniSONE (DELTASONE) 20 MG tablet    Sig: Take 1 tablet (20 mg total) by mouth daily with breakfast.    Dispense:  5 tablet    Refill:  0    Follow-up: Return in about 6 months (around 11/14/2022) for Chronic medical conditions.       Hoy Register, MD, FAAFP. [facility] [place], [place] [phone]   05/15/2022, 1:33 PM

## 2022-05-15 NOTE — Patient Instructions (Signed)
Tos en los adultos Cough, Adult La tos es un reflejo que despeja la garganta y las vas respiratorias (sistema respiratorio). Toser ayuda a curar y [employer]. Es normal toser a Occupational psychologist, pero si la tos aparece junto con otros sntomas o si dura mucho tiempo, puede indicar una afeccin que necesita tratamiento. Una tos aguda puede durar [place] 2 o 3 semanas, mientras que una tos crnica puede durar 8 semanas o ms [employer]. Por lo general, las causas de la tos son las siguientes: Infeccin del sistema respiratoriopor virus o bacterias. Aspirar sustancias que [employer]. Alergias. Asma. Mucosidad que se desliza por la parte posterior de la garganta (goteo posnasal). Fumar. cido que vuelve del estmago hacia el esfago (reflujo gastroesofgico). Ciertos medicamentos. Problemas pulmonares crnicos. Otras enfermedades, como insuficiencia cardaca o un cogulo de sangre en el pulmn (embolia pulmonar). Siga estas instrucciones en su casa: Medicamentos [employer] de venta libre y los recetados solamente como se lo haya indicado el mdico. Hable con el mdico antes de tomar un medicamento para la tos (antitusivo). Estilo de vida  Evite el humo del cigarrillo. No consuma ningn producto que contenga nicotina o tabaco, como cigarrillos, cigarrillos electrnicos y tabaco de Theatre manager. Si necesita ayuda para dejar de fumar, consulte al mdico. Beba suficiente lquido como para mantener la orina de color amarillo plido. Evite la cafena. No beba alcohol si el mdico se lo prohbe. Instrucciones generales  Est muy atento a los [employer] tos. Informe al mdico acerca de los cambios. Siempre cbrase la boca al toser. Evite las cosas que lo hagan toser, como perfumes, velas, productos de limpieza o humo de fogatas o de tabaco. Si el aire est seco, use un humidificador o un vaporizador de niebla fra en la habitacin o en la casa para ayudar a aflojar las secreciones. Si  la tos empeora por la noche, pruebe a dormir en posicin semierguida. Descanse todo lo que sea necesario. Concurra a todas las visitas de [address] se lo haya indicado el mdico. Esto es importante. Comunquese con un mdico si: Tiene nuevos sntomas. Tose y escupe pus. Tiene una tos que no mejora despus de 2 o 3 semanas, o que [place]. No puede controlar la tos con antitusivos y no puede dormir debido a Secretary/administrator. Siente un dolor que empeora o un dolor que no se alivia con medicamentos. Tiene fiebre. [place] de peso sin causa aparente. Transpira durante la noche. Solicite ayuda inmediatamente si: Tose y [employer]. Tiene dificultad para respirar. [employer] late [place] rpido. Estos sntomas pueden representar un problema grave que constituye Radio broadcast assistant. No espere a ver si los sntomas desaparecen. Solicite atencin mdica de inmediato. Comunquese con el servicio de emergencias de su localidad (911 en los [place]). No conduzca por sus propios medios [employer]. Resumen La tos es un reflejo que despeja la garganta y las vas respiratorias. Es normal toser a Occupational psychologist, pero si la tos aparece junto con otros sntomas o si dura mucho tiempo, puede indicar una afeccin que necesita tratamiento. Tome los medicamentos de venta libre y los recetados solamente como se lo haya indicado el mdico. Siempre cbrase la boca al toser. Comunquese con un mdico si tiene sntomas nuevos, o una tos que no mejora despus de 2 o 3 semanas o que [place]. Esta informacin no tiene Theme park manager el consejo del mdico. Asegrese de hacerle al mdico cualquier pregunta que tenga. Document Revised: 07/21/2018 Document Reviewed: 07/21/2018 Elsevier Patient Education  2023 Elsevier Inc.  

## 2022-05-16 LAB — LP+NON-HDL CHOLESTEROL
Cholesterol, Total: 143 mg/dL (ref 100–199)
HDL: 37 mg/dL — ABNORMAL LOW (ref >39)
LDL Chol Calc (NIH): 84 mg/dL (ref 0–99)
Total Non-HDL-Chol (LDL+VLDL): 106 mg/dL (ref 0–129)
Triglycerides: 122 mg/dL (ref 0–149)
VLDL Cholesterol Cal: 22 mg/dL (ref 5–40)

## 2022-05-16 LAB — HEMOGLOBIN A1C
Est. average glucose Bld gHb Est-mCnc: 111 mg/dL
Hgb A1c MFr Bld: 5.5 % (ref 4.8–5.6)

## 2022-05-17 ENCOUNTER — Other Ambulatory Visit: Payer: Self-pay

## 2022-07-10 ENCOUNTER — Ambulatory Visit: Payer: Self-pay | Attending: Family Medicine

## 2022-07-10 DIAGNOSIS — Z23 Encounter for immunization: Secondary | ICD-10-CM

## 2022-07-10 NOTE — Progress Notes (Signed)
Flu vaccine administered in right deltoid per protocols.  Information sheet given. Patient denies and pain or discomfort at injection site. Tolerated injection well no reaction.  

## 2022-08-01 ENCOUNTER — Other Ambulatory Visit: Payer: Self-pay

## 2022-08-02 ENCOUNTER — Other Ambulatory Visit: Payer: Self-pay

## 2022-08-06 ENCOUNTER — Other Ambulatory Visit: Payer: Self-pay

## 2022-08-07 ENCOUNTER — Ambulatory Visit: Payer: Self-pay | Admitting: Orthopaedic Surgery

## 2022-08-14 ENCOUNTER — Encounter: Payer: Self-pay | Admitting: Orthopaedic Surgery

## 2022-08-14 ENCOUNTER — Other Ambulatory Visit: Payer: Self-pay

## 2022-08-14 ENCOUNTER — Ambulatory Visit (INDEPENDENT_AMBULATORY_CARE_PROVIDER_SITE_OTHER): Payer: Self-pay

## 2022-08-14 ENCOUNTER — Ambulatory Visit (INDEPENDENT_AMBULATORY_CARE_PROVIDER_SITE_OTHER): Payer: Self-pay | Admitting: Orthopaedic Surgery

## 2022-08-14 DIAGNOSIS — M25512 Pain in left shoulder: Secondary | ICD-10-CM

## 2022-08-14 DIAGNOSIS — G8929 Other chronic pain: Secondary | ICD-10-CM

## 2022-08-14 DIAGNOSIS — G5603 Carpal tunnel syndrome, bilateral upper limbs: Secondary | ICD-10-CM

## 2022-08-14 MED ORDER — PREDNISONE 5 MG PO TABS
ORAL_TABLET | ORAL | 0 refills | Status: AC
  Filled 2022-08-14 (×2): qty 21, 6d supply, fill #0

## 2022-08-14 MED ORDER — LIDOCAINE HCL 1 % IJ SOLN
1.0000 mL | INTRAMUSCULAR | Status: AC | PRN
  Administered 2022-08-14: 1 mL

## 2022-08-14 MED ORDER — BUPIVACAINE HCL 0.5 % IJ SOLN
1.0000 mL | INTRAMUSCULAR | Status: AC | PRN
  Administered 2022-08-14: 1 mL

## 2022-08-14 MED ORDER — METHYLPREDNISOLONE ACETATE 40 MG/ML IJ SUSP
40.0000 mg | INTRAMUSCULAR | Status: AC | PRN
  Administered 2022-08-14: 40 mg

## 2022-08-14 MED ORDER — METHOCARBAMOL 750 MG PO TABS
750.0000 mg | ORAL_TABLET | Freq: Three times a day (TID) | ORAL | 2 refills | Status: DC | PRN
  Filled 2022-08-14: qty 20, 7d supply, fill #0

## 2022-08-14 NOTE — Progress Notes (Signed)
Office Visit Note   Patient: Andrew Sandoval           Date of Birth: 1981/04/17           MRN: [id number] Visit Date: 08/14/2022              Requested by: Charlott Rakes, MD [address] [place],  [place] [address] PCP: Charlott Rakes, MD   Assessment & Plan: Visit Diagnoses:  1. Chronic left shoulder pain   2. Bilateral carpal tunnel syndrome     Plan: Impression is bilateral carpal tunnel syndrome moderate to severe on the right moderate on the left in addition to left trap spasm likely from underlying cervical spine.  In regards to his carpal tunnel syndrome, we have discussed repeat cortisone injection versus surgery.  He is not interested in surgery at this time so we will repeat injections today.  In regards to the muscle spasm, we have discussed starting him on a steroid pack, muscle relaxer and physical therapy.  He is agreeable to this plan.  Follow-up as needed.  Follow-Up Instructions: Return if symptoms worsen or fail to improve.   Orders:  Orders Placed This Encounter  Procedures   XR Shoulder Left   Ambulatory referral to Physical Therapy   Meds ordered this encounter  Medications   methocarbamol (ROBAXIN-750) 750 MG tablet    Sig: Take 1 tablet (750 mg total) by mouth 3 (three) times daily as needed for muscle spasms.    Dispense:  20 tablet    Refill:  2   predniSONE (STERAPRED UNI-PAK 21 TAB) 5 MG (21) TBPK tablet    Sig: Take as directed    Dispense:  21 tablet    Refill:  0      Procedures: Hand/UE Inj: bilateral carpal tunnel for carpal tunnel syndrome on 08/14/2022 4:08 PM Indications: pain Details: 25 G needle Medications (Right): 1 mL lidocaine 1 %; 1 mL bupivacaine 0.5 %; 40 mg methylPREDNISolone acetate 40 MG/ML Medications (Left): 1 mL lidocaine 1 %; 1 mL bupivacaine 0.5 %; 40 mg methylPREDNISolone acetate 40 MG/ML Outcome: tolerated well, no immediate complications Patient was prepped and draped in the usual sterile  fashion.       Clinical Data: No additional findings.   Subjective: Chief Complaint  Patient presents with   Right Hand - Pain   Left Hand - Pain   Left Shoulder - Pain    HPI patient is a pleasant 42 year old gentleman who is here today with an interpreter.  He is here with recurrent symptoms from his bilateral carpal tunnel syndrome.  History of moderate to severe compression on the right and moderate on the left.  He was injected with cortisone in May 2023 with good relief until recently.  Other issue he brings up today is left shoulder pain.  The pain is actually to the traps and has been ongoing for the past 2 months.  No injury or change in activity.  Symptoms appear to be worse when he rotates, flexes or extends his neck.  No paresthesias down his arm other than the symptoms in his hands.  No weakness to the upper extremity.  Review of Systems as detailed in HPI.  All others reviewed and are negative.   Objective: Vital Signs: There were no vitals taken for this visit.  Physical Exam well-developed well-nourished gentleman in no acute distress.  Alert and oriented x 3.  Ortho Exam bilateral hand exam reveals positive Phalen and Tinel.  No thenar atrophy.  Left shoulder exam is unremarkable.  Cervical spine exam reveals no spinous or paraspinous tenderness.  He does have tenderness throughout the traps.  Increased pain with cervical spine flexion, extension and rotation.  He is neurovascular intact distally.  Specialty Comments:  No specialty comments available.  Imaging: XR Shoulder Left  Result Date: 08/14/2022 No acute or structural abnormality    PMFS History: Patient Active Problem List   Diagnosis Date Noted   Bilateral carpal tunnel syndrome 02/02/2020   Chronic pruritic rash in adult 10/21/2019   Pain in both hands 08/18/2019   Acute pain of right shoulder 08/18/2019   Hyperlipidemia 06/09/2017   Enlarged tonsils 11/11/2016   Tinea pedis of right foot  11/11/2016   Numbness 11/11/2016   Positive H. pylori test 07/23/2016   Chronic nasal congestion 01/18/2015   Past Medical History:  Diagnosis Date   Cocaine use 2006-2010   Hyperlipidemia 06/09/2017    Family History  Problem Relation Age of Onset   Diabetes Neg Hx     Past Surgical History:  Procedure Laterality Date   No prior surgery     Social History   Occupational History    Comment: [employer]  Tobacco Use   Smoking status: Former   Smokeless tobacco: Never   Tobacco comments:    quit 12 years ago  Vaping Use   Vaping Use: Never used  Substance and Sexual Activity   Alcohol use: No    Alcohol/week: 0.0 standard drinks of alcohol    Comment: quit 12 years ago   Drug use: Not Currently    Types: Cocaine, Marijuana    Comment: Prior cocaine; none in 11 years   Sexual activity: Yes

## 2022-08-16 ENCOUNTER — Other Ambulatory Visit: Payer: Self-pay

## 2022-08-21 ENCOUNTER — Other Ambulatory Visit: Payer: Self-pay

## 2022-08-29 ENCOUNTER — Other Ambulatory Visit: Payer: Self-pay

## 2022-08-29 ENCOUNTER — Encounter: Payer: Self-pay | Admitting: Family Medicine

## 2022-08-29 ENCOUNTER — Ambulatory Visit: Payer: Self-pay | Attending: Family Medicine | Admitting: Family Medicine

## 2022-08-29 DIAGNOSIS — H65492 Other chronic nonsuppurative otitis media, left ear: Secondary | ICD-10-CM

## 2022-08-29 DIAGNOSIS — E7800 Pure hypercholesterolemia, unspecified: Secondary | ICD-10-CM

## 2022-08-29 MED ORDER — CIPROFLOXACIN-DEXAMETHASONE 0.3-0.1 % OT SUSP
4.0000 [drp] | Freq: Two times a day (BID) | OTIC | 1 refills | Status: DC
  Filled 2022-08-29: qty 7.5, 19d supply, fill #0

## 2022-08-29 MED ORDER — ATORVASTATIN CALCIUM 40 MG PO TABS
40.0000 mg | ORAL_TABLET | Freq: Every day | ORAL | 1 refills | Status: DC
  Filled 2022-08-29: qty 90, 90d supply, fill #0

## 2022-08-29 NOTE — Progress Notes (Signed)
Left ear pain with yellow drainage for 3 months.

## 2022-08-29 NOTE — Progress Notes (Signed)
Subjective:  Patient ID: Andrew Sandoval, male    DOB: 02-20-1981  Age: 42 y.o. MRN: [id number]  CC: Ear Pain   HPI Andrew Sandoval is a 42 y.o. year old male with a history of hyperlipidemia.  Interval History:  He has had left ear otalgia and drainage for 3 weeks with resulting resolution over the last 3 days after he used OTC drops. He has right sided neck pain but no hearing loss. 5 months ago he was treated for left otitis media with augmentin. Past Medical History:  Diagnosis Date   Cocaine use 2006-2010   Hyperlipidemia 06/09/2017    Past Surgical History:  Procedure Laterality Date   No prior surgery      Family History  Problem Relation Age of Onset   Diabetes Neg Hx     Social History   Socioeconomic History   Marital status: Single    Spouse name: Not on file   Number of children: Not on file   Years of education: Not on file   Highest education level: Not on file  Occupational History    Comment: [employer]  Tobacco Use   Smoking status: Former   Smokeless tobacco: Never   Tobacco comments:    quit 12 years ago  Vaping Use   Vaping Use: Never used  Substance and Sexual Activity   Alcohol use: No    Alcohol/week: 0.0 standard drinks of alcohol    Comment: quit 12 years ago   Drug use: Not Currently    Types: Cocaine, Marijuana    Comment: Prior cocaine; none in 11 years   Sexual activity: Yes  Other Topics Concern   Not on file  Social History Narrative   Not on file   Social Determinants of Health   Financial Resource Strain: Not on file  Food Insecurity: Not on file  Transportation Needs: Not on file  Physical Activity: Not on file  Stress: Not on file  Social Connections: Not on file    Allergies  Allergen Reactions   Tylenol [Acetaminophen] Hives    Outpatient Medications Prior to Visit  Medication Sig Dispense Refill   betamethasone dipropionate 0.05 % cream Apply topically 2 (two) times daily. 45 g 1   cetirizine  (ZYRTEC ALLERGY) 10 MG tablet Take 1 tablet (10 mg total) by mouth daily. 30 tablet 12   fluticasone (FLONASE) 50 MCG/ACT nasal spray Place 2 sprays into both nostrils daily. 16 g 12   gabapentin (NEURONTIN) 300 MG capsule Take 1 capsule (300 mg total) by mouth 2 (two) times daily. 60 capsule 1   methocarbamol (ROBAXIN-750) 750 MG tablet Take 1 tablet (750 mg total) by mouth 3 (three) times daily as needed for muscle spasms. 20 tablet 2   atorvastatin (LIPITOR) 40 MG tablet Take 1 tablet (40 mg total) by mouth daily. 90 tablet 1   amoxicillin-clavulanate (AUGMENTIN) 875-125 MG tablet Take 1 tablet by mouth 2 (two) times daily. (Patient not taking: Reported on 08/29/2022) 20 tablet 0   benzonatate (TESSALON) 100 MG capsule Take 1 capsule (100 mg total) by mouth 2 (two) times daily as needed for cough. (Patient not taking: Reported on 08/29/2022) 20 capsule 0   predniSONE (DELTASONE) 20 MG tablet Take 1 tablet (20 mg total) by mouth daily with breakfast. (Patient not taking: Reported on 08/29/2022) 5 tablet 0   No facility-administered medications prior to visit.     ROS Review of Systems  Constitutional:  Negative for activity change and appetite  change.  HENT:  Positive for ear discharge and ear pain. Negative for sinus pressure and sore throat.   Respiratory:  Negative for chest tightness, shortness of breath and wheezing.   Cardiovascular:  Negative for chest pain and palpitations.  Gastrointestinal:  Negative for abdominal distention, abdominal pain and constipation.  Genitourinary: Negative.   Musculoskeletal: Negative.   Psychiatric/Behavioral:  Negative for behavioral problems and dysphoric mood.     Objective:  BP 122/82   Pulse 60   Ht 5\' 2"  (1.575 m)   Wt 173 lb (78.5 kg)   SpO2 98%   BMI 31.64 kg/m      08/29/2022    9:14 AM 05/15/2022    9:12 AM 03/20/2022    1:56 PM  BP/Weight  Systolic BP [id number]  Diastolic BP 82 74 83  Wt. (Lbs) 173 166.4 174  BMI 31.64 kg/m2  30.43 kg/m2 31.83 kg/m2      Physical Exam Constitutional:      Appearance: He is well-developed.  HENT:     Right Ear: Tympanic membrane normal.     Ears:     Comments: Left tympanic membrane with clear fluid no erythema of eustachian tube Neck:     Comments: Slight tenderness of left sternocleidomastoid Cardiovascular:     Rate and Rhythm: Normal rate.     Heart sounds: Normal heart sounds. No murmur heard. Pulmonary:     Effort: Pulmonary effort is normal.     Breath sounds: Normal breath sounds. No wheezing or rales.  Chest:     Chest wall: No tenderness.  Abdominal:     General: Bowel sounds are normal. There is no distension.     Palpations: Abdomen is soft. There is no mass.     Tenderness: There is no abdominal tenderness.  Musculoskeletal:        General: Normal range of motion.     Right lower leg: No edema.     Left lower leg: No edema.  Neurological:     Mental Status: He is alert and oriented to person, place, and time.  Psychiatric:        Mood and Affect: Mood normal.        Latest Ref Rng & Units 03/20/2022    3:00 PM 11/27/2021   11:18 AM 06/20/2021   10:24 AM  CMP  Glucose 70 - 99 mg/dL 87  101  102   BUN 6 - 24 mg/dL 10  14  20    Creatinine 0.76 - 1.27 mg/dL 0.88  1.07  0.94   Sodium 134 - 144 mmol/L 142  140  143   Potassium 3.5 - 5.2 mmol/L 4.1  4.2  4.2   Chloride 96 - 106 mmol/L 104  103  107   CO2 20 - 29 mmol/L 23  25  21    Calcium 8.7 - 10.2 mg/dL 9.1  9.7  9.2   Total Protein 6.0 - 8.5 g/dL  7.7  7.1   Total Bilirubin 0.0 - 1.2 mg/dL  1.4  0.5   Alkaline Phos 44 - 121 IU/L  119  124   AST 0 - 40 IU/L  34  27   ALT 0 - 44 IU/L  75  44     Lipid Panel     Component Value Date/Time   CHOL 143 05/15/2022 0951   TRIG 122 05/15/2022 0951   HDL 37 (L) 05/15/2022 0951   CHOLHDL 4.5 06/20/2021 1024   LDLCALC 84 05/15/2022 0951  CBC    Component Value Date/Time   WBC 4.2 03/20/2022 1500   WBC 5.2 07/04/2020 0905   RBC 5.20  03/20/2022 1500   RBC 5.48 07/04/2020 0905   HGB 16.3 03/20/2022 1500   HCT 47.3 03/20/2022 1500   PLT 269 03/20/2022 1500   MCV 91 03/20/2022 1500   MCH 31.3 03/20/2022 1500   MCH 30.8 07/04/2020 0905   MCHC 34.5 03/20/2022 1500   MCHC 33.6 07/04/2020 0905   RDW 12.5 03/20/2022 1500   LYMPHSABS 1.5 03/20/2022 1500   MONOABS 0.4 04/18/2009 1420   EOSABS 0.1 03/20/2022 1500   BASOSABS 0.0 03/20/2022 1500    Lab Results  Component Value Date   HGBA1C 5.5 05/15/2022    Assessment & Plan:  1. 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  Dispense: 7.5 mL; Refill: 1  2. Pure hypercholesterolemia Controlled - atorvastatin (LIPITOR) 40 MG tablet; Take 1 tablet (40 mg total) by mouth daily.  Dispense: 90 tablet; Refill: 1   Meds ordered this encounter  Medications   ciprofloxacin-dexamethasone (CIPRODEX) OTIC suspension    Sig: Place 4 drops into the left ear 2 (two) times daily.    Dispense:  7.5 mL    Refill:  1   atorvastatin (LIPITOR) 40 MG tablet    Sig: Take 1 tablet (40 mg total) by mouth daily.    Dispense:  90 tablet    Refill:  1    Follow-up: Return in about 6 months (around 03/01/2023) for Chronic medical conditions.       Charlott Rakes, MD, FAAFP. [facility], [place]   08/29/2022, 2:30 PM

## 2022-08-29 NOTE — Patient Instructions (Signed)
Otitis media en los adultos Otitis Media, Adult  La otitis media es la inflamacin y la acumulacin de lquido en el odo [place], que se manifiesta con signos y sntomas de una infeccin [place]. El odo medio es la parte del odo que contiene los huesos de la audicin, as Contractor aire que ayuda a Conservator, museum/gallery los sonidos al cerebro. Cuando se acumula lquido infectado en este espacio, genera presin y Astronomer una infeccin en el odo. La trompa de [place] conecta el odo medio con la parte posterior de la nariz (nasofaringe) y, normalmente, permite que ingrese aire en el odo [place]. Si la trompa de Industry se obstruye, puede acumularse lquido e infectarse. Cules son las causas? Esta afeccin es consecuencia de una obstruccin en la trompa de [place]. La causa puede ser una mucosidad o la hinchazn de la trompa. Algunos de los problemas que pueden causar Ardelia Mems obstruccin son los siguientes: Resfriados u otra infeccin de las vas respiratorias superiores. Alergias. Un irritante, como el humo del tabaco. Adenoides agrandadas. Las adenoides son zonas de tejido blando ubicadas en la parte posterior de la garganta, detrs de la nariz y Company secretary. Shanda Bumps parte del sistema de defensa del organismo (sistema inmunitario). Un bulto en la nasofaringe. Dao en el odo a causa de cambios de presin (barotraumatismo). Qu incrementa el riesgo? Es ms probable que tenga esta afeccin si: Fuma o se expone al humo de tabaco. Tiene una abertura en la parte superior de la boca (hendidura del paladar). Tiene reflujo gastroesofgico. Tiene un trastorno del sistema inmunitario. Cules son los signos o sntomas? Los sntomas de esta afeccin incluyen: Dolor de odo. Cristy Hilts. Disminucin de la audicin. Cansancio (letargo). Supuracin de lquido por el odo, si el tmpano se rompe o revienta. Zumbidos en el odo. Cmo se diagnostica?  Esta afeccin se diagnostica mediante un examen fsico. Durante  el examen, con un instrumento llamado otoscopio, el mdico mirar dentro del odo para Geographical information systems officer, hinchazn y presencia de lquido. Tambin le preguntar acerca de sus sntomas. El mdico tambin puede indicarle [place], como: Una otoscopia neumtica. Es un estudio que se realiza para Academic librarian movimiento del tmpano. Se realiza introduciendo una pequea cantidad de aire en el odo. Un timpanograma. Es un estudio que muestra si el tmpano se [employer] en respuesta a la presin de aire en el canal [place]. Genera un grfico para que el mdico lo evale. Cmo se trata? Esta afeccin puede desaparecer sin tratamiento en el transcurso de 3 a 5 das. Sin embargo, si la causa de la afeccin es una infeccin bacteriana y no desaparece sin tratamiento, o si vuelve a aparecer ms de una vez, el mdico podra hacer lo siguiente: Recetarle antibiticos para tratar la infeccin. Recetarle o recomendarle medicamentos para Financial controller. Siga estas indicaciones en su casa: Use los medicamentos de venta libre y los recetados solamente como se lo haya indicado el mdico. Si le recetaron un antibitico, tmelo como se lo haya indicado el mdico. No deje de tomar el antibitico aunque comience a sentirse mejor. Concurra a [place]. Esto es importante. Comunquese con un mdico si: Le sangra la nariz. Tiene un bulto en el cuello. No se siente mejor al cabo de 5 das. Empeora en lugar de mejorar. Solicite ayuda de inmediato si: Tiene dolor intenso que no puede controlar con medicamentos. Tiene hinchazn, enrojecimiento o dolor en el odo. Siente rigidez en el cuello. Una parte de su rostro no se mueve (paralizada). Marin Comment  duele el hueso que se encuentra detrs del odo (mastoides) al tocarlo. Desarrolla un dolor de cabeza intenso. Resumen La otitis media es el enrojecimiento, Conservation officer, historic buildings y la hinchazn del odo medio, que generalmente provoca dolor y disminucin de la  audicin. Esta afeccin puede desaparecer sin tratamiento en el transcurso de 3 a 5 das. Si el problema no desaparece en el trmino de 3 a 5 das, el mdico puede recetarle medicamentos para tratar la infeccin. Si le recetaron un antibitico, tmelo como se lo haya indicado el mdico. Siga todas las instrucciones que le haya dado su mdico. Esta informacin no tiene Marine scientist el consejo del mdico. Asegrese de hacerle al mdico cualquier pregunta que tenga. Document Revised: 09/29/2020 Document Reviewed: 09/29/2020 Elsevier Patient Education  [place].

## 2022-09-03 ENCOUNTER — Other Ambulatory Visit: Payer: Self-pay

## 2022-11-05 ENCOUNTER — Other Ambulatory Visit: Payer: Self-pay

## 2022-11-05 ENCOUNTER — Encounter: Payer: Self-pay | Admitting: Family Medicine

## 2022-11-05 ENCOUNTER — Ambulatory Visit: Payer: Self-pay | Attending: Family Medicine | Admitting: Family Medicine

## 2022-11-05 VITALS — BP 122/80 | HR 60 | Temp 97.9°F | Ht 62.0 in | Wt 173.6 lb

## 2022-11-05 DIAGNOSIS — E7800 Pure hypercholesterolemia, unspecified: Secondary | ICD-10-CM

## 2022-11-05 DIAGNOSIS — M542 Cervicalgia: Secondary | ICD-10-CM

## 2022-11-05 MED ORDER — METHOCARBAMOL 750 MG PO TABS
750.0000 mg | ORAL_TABLET | Freq: Three times a day (TID) | ORAL | 1 refills | Status: DC | PRN
Start: 2022-11-05 — End: 2023-01-23
  Filled 2022-11-05: qty 60, 20d supply, fill #0

## 2022-11-05 MED ORDER — ATORVASTATIN CALCIUM 40 MG PO TABS
40.0000 mg | ORAL_TABLET | Freq: Every day | ORAL | 1 refills | Status: DC
Start: 2022-11-05 — End: 2022-11-06
  Filled 2022-11-05: qty 90, 90d supply, fill #0

## 2022-11-05 NOTE — Patient Instructions (Signed)
Calambres y espasmos musculares Muscle Cramps and Spasms Los calambres y espasmos musculares se producen cuando un msculo o varios msculos se tensionan, y usted no lo puede Chief Operating Officer (Higher education careers adviser involuntaria). Son un problema frecuente que puede aparecer en cualquier msculo. El lugar ms frecuente es en los msculos de la pantorrilla. Los calambres y espasmos musculares tienen algunas diferencias: Los calambres musculares son dolorosos. Pueden aparecer y Geneticist, molecular, y pueden durar unos segundos o hasta 15 minutos. Los calambres musculares a menudo son ms fuertes y duran ms que los [employer]. Los espasmos musculares pueden o no ser dolorosos. Pueden durar solo unos segundos o mucho ms [employer]. Ciertas afecciones, como la diabetes o la enfermedad de Parkinson, pueden hacer que sea ms propenso a tener calambres o espasmos. Pero en la [employer] casos, los calambres y espasmos no son consecuencia de otras afecciones. Las causas ms frecuentes incluyen las siguientes: Public relations account executive. Esto es cuando se hace ms trabajo fsico o actividad fsica de lo que el cuerpo soporta. Uso excesivo por hacer los mismos movimientos demasiadas veces. Permanecer en una posicin durante [employer]. Preparacin, estado o tcnica inadecuados al practicar un deporte o hacer una [place]. No tener la cantidad suficiente de agua u otros lquidos en el organismo (deshidratacin). Algunas otras causas son las siguientes: Lesiones. Efectos secundarios de algunos medicamentos. Muy pocas sales y minerales en el cuerpo (electrolitos), como potasio y calcio. Esto puede ocurrir si usted toma diurticos o si est embarazada. En muchos casos, se desconoce la causa de los calambres o espasmos musculares. Siga estas instrucciones en su casa: Comida y bebida Beba suficiente lquido para mantener el pis (orina) de color amarillo plido. Esto puede ayudar a prevenir calambres o espasmos. Consuma una  dieta saludable que incluya muchos nutrientes para ayudar al funcionamiento de los msculos. Una dieta saludable incluye frutas y verduras, protenas magras, cereales integrales y productos lcteos descremados o semidescremados. Control del dolor y la rigidez     Intente Engineer, maintenance (IT), Furniture conservator/restorer y International aid/development worker el msculo afectado. Hgalo durante algunos minutos cada vez. Si se lo indican, aplique hielo en los msculos. Esto puede ayudar si despus de un calambre o espasmo tiene dolor o sensibilidad. Ponga el hielo en una bolsa plstica. Coloque una toalla entre la piel y Copy. Aplique el hielo durante 20 minutos, 2 a 3 veces por da. Si se lo indican, aplique calor en los msculos tensos o tirantes, con la frecuencia que le haya indicado el mdico. Use la fuente de calor que el mdico le recomiende, como una compresa de calor hmedo o una almohadilla trmica. Coloque una toalla entre la piel y la fuente de Airline pilot. Aplique calor durante 20 a 30 minutos. Si la piel se le pone de color rojo brillante, retire el hielo o Company secretary de inmediato para evitar daos en la piel. El [place] de dao es mayor si no puede sentir dolor, Airline pilot o fro. Tome duchas o baos con agua caliente para ayudar a [employer]. Instrucciones generales Si tiene calambres con frecuencia, evite el ejercicio intenso durante [employer]. Use los medicamentos de venta libre y los recetados solamente como se lo haya indicado el mdico. Controle si hay algn cambio en sus sntomas. Comunquese con un mdico si: Los calambres o espasmos son ms intensos u ocurren con ms frecuencia. Sus calambres o espasmos no mejoran con Museum/gallery conservator. Esta informacin no tiene Theme park manager el consejo del mdico. Asegrese de hacerle al mdico cualquier pregunta que  tenga. Document Revised: 02/13/2022 Document Reviewed: 02/13/2022 Elsevier Patient Education  2024 [employer].

## 2022-11-05 NOTE — Progress Notes (Signed)
Subjective:  Patient ID: Andrew Sandoval, male    DOB: 04/06/1981  Age: 42 y.o. MRN: [id number]  CC: Hyperlipidemia   HPI Andrew Sandoval is a 42 y.o. year old male with a history of hyperlipidemia.  Interval history :  He Complains of a 2 month history of left sided neck pain rated as 8/10 with associated difficulty turning to the left affecting his sleep.  He denies presence of radiation down his left upper extremities and has no tingling or numbness in his hands. Of note he had a visit with orthopedics close to 3 months ago for chronic left shoulder pain and bilateral carpal tunnel syndrome.  He was placed on a steroid pack as well as a muscle relaxant.  He remains adherent with his statin and is fasting in anticipation of labs today. Past Medical History:  Diagnosis Date   Cocaine use 2006-2010   Hyperlipidemia 06/09/2017    Past Surgical History:  Procedure Laterality Date   No prior surgery      Family History  Problem Relation Age of Onset   Diabetes Neg Hx     Social History   Socioeconomic History   Marital status: Single    Spouse name: Not on file   Number of children: Not on file   Years of education: Not on file   Highest education level: Not on file  Occupational History    Comment: [employer]  Tobacco Use   Smoking status: Former   Smokeless tobacco: Never   Tobacco comments:    quit 12 years ago  Vaping Use   Vaping Use: Never used  Substance and Sexual Activity   Alcohol use: No    Alcohol/week: 0.0 standard drinks of alcohol    Comment: quit 12 years ago   Drug use: Not Currently    Types: Cocaine, Marijuana    Comment: Prior cocaine; none in 11 years   Sexual activity: Yes  Other Topics Concern   Not on file  Social History Narrative   Not on file   Social Determinants of Health   Financial Resource Strain: Not on file  Food Insecurity: Not on file  Transportation Needs: Not on file  Physical Activity: Not on file  Stress:  Not on file  Social Connections: Not on file    Allergies  Allergen Reactions   Tylenol [Acetaminophen] Hives    Outpatient Medications Prior to Visit  Medication Sig Dispense Refill   betamethasone dipropionate 0.05 % cream Apply topically 2 (two) times daily. 45 g 1   cetirizine (ZYRTEC ALLERGY) 10 MG tablet Take 1 tablet (10 mg total) by mouth daily. 30 tablet 12   ciprofloxacin-dexamethasone (CIPRODEX) OTIC suspension Place 4 drops into the left ear 2 (two) times daily. 7.5 mL 1   fluticasone (FLONASE) 50 MCG/ACT nasal spray Place 2 sprays into both nostrils daily. 16 g 12   gabapentin (NEURONTIN) 300 MG capsule Take 1 capsule (300 mg total) by mouth 2 (two) times daily. 60 capsule 1   atorvastatin (LIPITOR) 40 MG tablet Take 1 tablet (40 mg total) by mouth daily. 90 tablet 1   methocarbamol (ROBAXIN-750) 750 MG tablet Take 1 tablet (750 mg total) by mouth 3 (three) times daily as needed for muscle spasms. 20 tablet 2   amoxicillin-clavulanate (AUGMENTIN) 875-125 MG tablet Take 1 tablet by mouth 2 (two) times daily. (Patient not taking: Reported on 08/29/2022) 20 tablet 0   benzonatate (TESSALON) 100 MG capsule Take 1 capsule (100 mg total)  by mouth 2 (two) times daily as needed for cough. (Patient not taking: Reported on 08/29/2022) 20 capsule 0   predniSONE (DELTASONE) 20 MG tablet Take 1 tablet (20 mg total) by mouth daily with breakfast. (Patient not taking: Reported on 08/29/2022) 5 tablet 0   No facility-administered medications prior to visit.     ROS Review of Systems  Constitutional:  Negative for activity change and appetite change.  HENT:  Negative for sinus pressure and sore throat.   Respiratory:  Negative for chest tightness, shortness of breath and wheezing.   Cardiovascular:  Negative for chest pain and palpitations.  Gastrointestinal:  Negative for abdominal distention, abdominal pain and constipation.  Genitourinary: Negative.   Musculoskeletal:  Positive for neck  pain.  Psychiatric/Behavioral:  Negative for behavioral problems and dysphoric mood.     Objective:  BP 122/80   Pulse 60   Temp 97.9 F (36.6 C) (Oral)   Ht 5\' 2"  (1.575 m)   Wt 173 lb 9.6 oz (78.7 kg)   SpO2 97%   BMI 31.75 kg/m      11/05/2022    9:13 AM 08/29/2022    9:14 AM 05/15/2022    9:12 AM  BP/Weight  Systolic BP 122 122 118  Diastolic BP 80 82 74  Wt. (Lbs) 173.6 173 166.4  BMI 31.75 kg/m2 31.64 kg/m2 30.43 kg/m2      Physical Exam Constitutional:      Appearance: He is well-developed.  Neck:     Comments: Tenderness on lateral rotation of neck to the left Cardiovascular:     Rate and Rhythm: Normal rate.     Heart sounds: Normal heart sounds. No murmur heard. Pulmonary:     Effort: Pulmonary effort is normal.     Breath sounds: Normal breath sounds. No wheezing or rales.  Chest:     Chest wall: No tenderness.  Abdominal:     General: Bowel sounds are normal. There is no distension.     Palpations: Abdomen is soft. There is no mass.     Tenderness: There is no abdominal tenderness.  Musculoskeletal:        General: Normal range of motion.     Cervical back: Tenderness (TTP of L sternocleidomastoid) present.     Right lower leg: No edema.     Left lower leg: No edema.  Neurological:     Mental Status: He is alert and oriented to person, place, and time.  Psychiatric:        Mood and Affect: Mood normal.        Latest Ref Rng & Units 03/20/2022    3:00 PM 11/27/2021   11:18 AM 06/20/2021   10:24 AM  CMP  Glucose 70 - 99 mg/dL 87  161  096   BUN 6 - 24 mg/dL 10  14  20    Creatinine 0.76 - 1.27 mg/dL 0.45  4.09  8.11   Sodium 134 - 144 mmol/L 142  140  143   Potassium 3.5 - 5.2 mmol/L 4.1  4.2  4.2   Chloride 96 - 106 mmol/L 104  103  107   CO2 20 - 29 mmol/L 23  25  21    Calcium 8.7 - 10.2 mg/dL 9.1  9.7  9.2   Total Protein 6.0 - 8.5 g/dL  7.7  7.1   Total Bilirubin 0.0 - 1.2 mg/dL  1.4  0.5   Alkaline Phos 44 - 121 IU/L  119  124   AST  0 - 40 IU/L  34  27   ALT 0 - 44 IU/L  75  44     Lipid Panel     Component Value Date/Time   CHOL 143 05/15/2022 0951   TRIG 122 05/15/2022 0951   HDL 37 (L) 05/15/2022 0951   CHOLHDL 4.5 06/20/2021 1024   LDLCALC 84 05/15/2022 0951    CBC    Component Value Date/Time   WBC 4.2 03/20/2022 1500   WBC 5.2 07/04/2020 0905   RBC 5.20 03/20/2022 1500   RBC 5.48 07/04/2020 0905   HGB 16.3 03/20/2022 1500   HCT 47.3 03/20/2022 1500   PLT 269 03/20/2022 1500   MCV 91 03/20/2022 1500   MCH 31.3 03/20/2022 1500   MCH 30.8 07/04/2020 0905   MCHC 34.5 03/20/2022 1500   MCHC 33.6 07/04/2020 0905   RDW 12.5 03/20/2022 1500   LYMPHSABS 1.5 03/20/2022 1500   MONOABS 0.4 04/18/2009 1420   EOSABS 0.1 03/20/2022 1500   BASOSABS 0.0 03/20/2022 1500    Lab Results  Component Value Date   HGBA1C 5.5 05/15/2022    Assessment & Plan:  1. Pure hypercholesterolemia Controlled Low-cholesterol diet Continue statin - CMP14+EGFR - LP+Non-HDL Cholesterol - atorvastatin (LIPITOR) 40 MG tablet; Take 1 tablet (40 mg total) by mouth daily.  Dispense: 90 tablet; Refill: 1  2. Neck pain Likely muscle spasm - Ambulatory referral to Physical Therapy - methocarbamol (ROBAXIN-750) 750 MG tablet; Take 1 tablet (750 mg total) by mouth 3 (three) times daily as needed for muscle spasms.  Dispense: 60 tablet; Refill: 1     Meds ordered this encounter  Medications   methocarbamol (ROBAXIN-750) 750 MG tablet    Sig: Take 1 tablet (750 mg total) by mouth 3 (three) times daily as needed for muscle spasms.    Dispense:  60 tablet    Refill:  1   atorvastatin (LIPITOR) 40 MG tablet    Sig: Take 1 tablet (40 mg total) by mouth daily.    Dispense:  90 tablet    Refill:  1    Follow-up: Return in about 6 months (around 05/08/2023) for Chronic medical conditions.       Hoy Register, MD, FAAFP. [facility] [place], [place] [phone]   11/05/2022, 9:55  AM

## 2022-11-05 NOTE — Progress Notes (Signed)
Having pain in neck.

## 2022-11-06 ENCOUNTER — Other Ambulatory Visit: Payer: Self-pay | Admitting: Family Medicine

## 2022-11-06 ENCOUNTER — Other Ambulatory Visit: Payer: Self-pay

## 2022-11-06 DIAGNOSIS — E7800 Pure hypercholesterolemia, unspecified: Secondary | ICD-10-CM

## 2022-11-06 LAB — CMP14+EGFR
ALT: 52 IU/L — ABNORMAL HIGH (ref 0–44)
AST: 37 IU/L (ref 0–40)
Albumin/Globulin Ratio: 1.6 (ref 1.2–2.2)
Albumin: 4.4 g/dL (ref 4.1–5.1)
Alkaline Phosphatase: 127 IU/L — ABNORMAL HIGH (ref 44–121)
BUN/Creatinine Ratio: 17 (ref 9–20)
BUN: 16 mg/dL (ref 6–24)
Bilirubin Total: 0.2 mg/dL (ref 0.0–1.2)
Calcium: 9.4 mg/dL (ref 8.7–10.2)
Chloride: 119 mmol/L (ref 96–106)
Creatinine, Ser: 0.96 mg/dL (ref 0.76–1.27)
Globulin, Total: 2.8 g/dL (ref 1.5–4.5)
Glucose: 97 mg/dL (ref 70–99)
Total Protein: 7.2 g/dL (ref 6.0–8.5)
eGFR: 101 mL/min/{1.73_m2} (ref >59)

## 2022-11-06 LAB — LP+NON-HDL CHOLESTEROL
Cholesterol, Total: 279 mg/dL — ABNORMAL HIGH (ref 100–199)
HDL: 27 mg/dL — ABNORMAL LOW (ref >39)
Total Non-HDL-Chol (LDL+VLDL): 252 mg/dL — ABNORMAL HIGH (ref 0–129)
Triglycerides: 1320 mg/dL (ref 0–149)

## 2022-11-06 MED ORDER — ATORVASTATIN CALCIUM 80 MG PO TABS
80.0000 mg | ORAL_TABLET | Freq: Every day | ORAL | 1 refills | Status: DC
Start: 2022-11-06 — End: 2023-08-20
  Filled 2022-11-06: qty 90, 90d supply, fill #0
  Filled 2023-03-12: qty 90, 90d supply, fill #1

## 2022-11-06 MED ORDER — FENOFIBRATE 145 MG PO TABS
145.0000 mg | ORAL_TABLET | Freq: Every day | ORAL | 1 refills | Status: DC
  Filled 2022-11-06: qty 90, 90d supply, fill #0
  Filled 2023-03-12: qty 90, 90d supply, fill #1

## 2022-11-06 MED ORDER — ICOSAPENT ETHYL 1 G PO CAPS
2.0000 g | ORAL_CAPSULE | Freq: Two times a day (BID) | ORAL | 6 refills | Status: DC
  Filled 2022-11-06: qty 120, 30d supply, fill #0

## 2022-11-13 ENCOUNTER — Ambulatory Visit: Payer: Self-pay | Admitting: Family Medicine

## 2022-11-19 ENCOUNTER — Telehealth: Payer: Self-pay | Admitting: Radiology

## 2022-11-19 NOTE — Telephone Encounter (Signed)
Patient left voicemail on triage line requesting return call in regards to appointment tomorrow. He is scheduled for PT.    CB [phone]

## 2022-11-20 ENCOUNTER — Ambulatory Visit (INDEPENDENT_AMBULATORY_CARE_PROVIDER_SITE_OTHER): Payer: Self-pay | Admitting: Physical Therapy

## 2022-11-20 ENCOUNTER — Other Ambulatory Visit: Payer: Self-pay

## 2022-11-20 ENCOUNTER — Encounter: Payer: Self-pay | Admitting: Physical Therapy

## 2022-11-20 DIAGNOSIS — M436 Torticollis: Secondary | ICD-10-CM

## 2022-11-20 DIAGNOSIS — M542 Cervicalgia: Secondary | ICD-10-CM

## 2022-11-20 DIAGNOSIS — M6281 Muscle weakness (generalized): Secondary | ICD-10-CM

## 2022-11-20 NOTE — Therapy (Signed)
OUTPATIENT PHYSICAL THERAPY CERVICAL EVALUATION   Patient Name: Andrew Sandoval MRN: [id number] DOB:1981/01/24, 42 y.o., male Today's Date: 11/20/2022  END OF SESSION:  PT End of Session - 11/20/22 1022     Visit Number 1    Number of Visits 16    Date for PT Re-Evaluation 01/17/23    PT Start Time 1018    PT Stop Time 1100    PT Time Calculation (min) 42 min    Activity Tolerance Patient tolerated treatment well    Behavior During Therapy [facility] for tasks assessed/performed             Past Medical History:  Diagnosis Date   Cocaine use 2006-2010   Hyperlipidemia 06/09/2017   Past Surgical History:  Procedure Laterality Date   No prior surgery     Patient Active Problem List   Diagnosis Date Noted   Bilateral carpal tunnel syndrome 02/02/2020   Chronic pruritic rash in adult 10/21/2019   Pain in both hands 08/18/2019   Acute pain of right shoulder 08/18/2019   Hyperlipidemia 06/09/2017   Enlarged tonsils 11/11/2016   Tinea pedis of right foot 11/11/2016   Numbness 11/11/2016   Positive H. pylori test 07/23/2016   Chronic nasal congestion 01/18/2015    PCP: Delma Officer, MD  REFERRING PROVIDER: Delma Officer, MD  REFERRING DIAG: M54.2 (ICD-10-CM) - Neck pain  THERAPY DIAG:  Muscle weakness (generalized)  Cervicalgia  Stiffness of neck  Rationale for Evaluation and Treatment: Rehabilitation  ONSET DATE:  2-3 months                       SUBJECTIVE:                                                                                                                                                                                                 SUBJECTIVE STATEMENT: Pt arriving with 2 month history of left sided neck pain rated as 8/10 with associated difficulty turning to the left affecting his sleep.  He denies presence of radiation down his left upper extremities and has no tingling or numbness in his hands. Of note he had a visit with orthopedics  close to 3 months ago for chronic left shoulder pain and bilateral carpal tunnel syndrome.  He was placed on a steroid pack as well as a muscle relaxant.  PERTINENT HISTORY:  Bil CTS, left shoulder pain  PAIN:  NPRS scale: up to 8/10 Pain location: left upper trap, neck Pain description: achy Aggravating factors: turning to Rt Relieving factors: nothing  PRECAUTIONS: None  WEIGHT  BEARING RESTRICTIONS: No  FALLS:  Has patient fallen in last 6 months? No  LIVING ENVIRONMENT: Lives with: lives with their family and lives with their spouse Lives in: House/apartment Stairs: yes outside Has following equipment at home: None  OCCUPATION: works at [employer]  PLOF: Independent  PATIENT GOALS: Stop hurting so I can work  Next MD visit:  OBJECTIVE:   DIAGNOSTIC FINDINGS:  11/20/22: No acute or structural abnormality Left shoulder  from 08/14/22  PATIENT SURVEYS:  11/20/22: FOTO intake:  45%     COGNITION: Overall cognitive status: Within functional limits for tasks assessed  SENSATION: WFL  POSTURE:  No Significant postural limitations  PALPATION: 11/20/22: TTP: cervical paraspinals, bil upper trap   CERVICAL ROM:   ROM AROM (deg) 11/20/22  Flexion 40  Extension 10  Right lateral flexion 15  Left lateral flexion 12  Right rotation 15  Left rotation 10   11/20/22: all measurements limited by pain  (Blank rows = not tested)  UPPER EXTREMITY ROM:   ROM Right 11/20/22 Left 11/20/22  Shoulder flexion 120 100  Shoulder extension 30 30  Shoulder abduction    Shoulder adduction    Shoulder extension    Shoulder internal rotation    Shoulder external rotation    Elbow flexion    Elbow extension    Wrist flexion    Wrist extension    Wrist ulnar deviation    Wrist radial deviation    Wrist pronation    Wrist supination     (Blank rows = not tested)  UPPER EXTREMITY MMT:  MMT Right 11/20/22 Left 11/20/22  Shoulder flexion 5 4  Shoulder extension 5 4   Shoulder abduction 4 4  Shoulder adduction    Shoulder extension    Shoulder internal rotation 4 4  Shoulder external rotation 4 4  Middle trapezius    Lower trapezius    Grip strength     (Blank rows = not tested)                                                                                                                                                                                    TODAY'S TREATMENT:  DATE: Therex:  HEP instruction/performance c cues for techniques, handout provided.  Trial set performed of each for comprehension and symptom assessment.  See below for exercise list Manual:  Skilled palpation of active trigger points during dry needling STM performed to left upper trap Trigger Point Dry-Needling  Treatment instructions: Expect mild to moderate muscle soreness. S/S of pneumothorax if dry needled over a lung field, and to seek immediate medical attention should they occur. Patient verbalized understanding of these instructions and education.  Patient Consent Given: Yes Education handout provided: No, handout instructions were verbally explained before treatment via interpreter Muscles treated: left upper trap Treatment response/outcome: multiple twitch responses noted, pt with improvements in ROM and cervical rotation following treatment Modalities:  IFC, Left upper trap: x 8 minutes, intensity to tolerance  PATIENT EDUCATION:  Education details: HEP, POC Person educated: Patient Education method: Programmer, multimedia, Demonstration, Verbal cues, and Handouts Education comprehension: verbalized understanding, returned demonstration, and verbal cues required  HOME EXERCISE PROGRAM: Access Code: [email or web address] URL: [email or web address] Date: 11/20/2022 Prepared by: Narda Amber  Exercises - Supine Cervical Retraction with Towel  - 3 x daily - 7 x  weekly - 10 reps - 5 seconds hold - Seated Upper Trapezius Stretch  - 3 x daily - 7 x weekly - 5 reps - 10 seconds hold - Gentle Levator Scapulae Stretch  - 3 x daily - 7 x weekly - 5 reps - 10 seconds hold  ASSESSMENT:  CLINICAL IMPRESSION: Patient is a 42 y.o. who comes to clinic with complaints of cervical pain. Pt reporting difficulty with head turns and sleeping difficulties. Pt presents with mobility, strength and movement coordination deficits that impair their ability to perform usual daily and recreational functional activities without increase difficulty/symptoms at this time. Pt requesting DN to his left upper trap this visit. Pt stating he has had it before with good success and wanted to try it again. Pt with improvements noted following treatment today. E-stimulation also performed at end of session. Pt was given handout on how to purchase one for home use.  Patient to benefit from skilled PT services to address impairments and limitations to improve to previous level of function without restriction secondary to condition.    OBJECTIVE IMPAIRMENTS: decreased ROM, decreased strength, impaired flexibility, impaired UE functional use, postural dysfunction, and pain.   ACTIVITY LIMITATIONS: lifting and sleeping  PARTICIPATION LIMITATIONS: community activity and occupation  PERSONAL FACTORS: 1-2 comorbidities: see pertinent history above  are also affecting patient's functional outcome.   REHAB POTENTIAL: Good  CLINICAL DECISION MAKING: Stable/uncomplicated  EVALUATION COMPLEXITY: Low   GOALS: Goals reviewed with patient? Yes  SHORT TERM GOALS: (target date for Short term goals are 3 weeks 12/13/22)  1.Patient will demonstrate independent use of home exercise program to maintain progress from in clinic treatments. Goal status: New  LONG TERM GOALS: (target dates for all long term goals are 8 weeks  01/17/23 )   1. Patient will demonstrate/report pain at worst less than or  equal to 2/10 to facilitate minimal limitation in daily activity secondary to pain symptoms. Goal status: New   2. Patient will demonstrate independent use of home exercise program to facilitate ability to maintain/progress functional gains from skilled physical therapy services. Goal status: New   3. Patient will demonstrate FOTO outcome > or = 66 % to indicate reduced disability due to condition. Goal status: New   4.  Patient will demonstrate cervical AROM WFL s symptoms to facilitate usual head movements for daily activity  including driving, self care.   Goal status: New   5.  Pt will improve bilateral cervical rotation to >/= 60 degrees for improved functional mobility.   Goal status: New   6.  pt will be able to lift 15 # from floor to over head shelf using bil UEs with pain </= 2/10 in his left upper trap. Goal status: New    PLAN:  PT FREQUENCY: 1-2x/week  PT DURATION: 10 weeks  PLANNED INTERVENTIONS: Therapeutic exercises, Therapeutic activity, Neuro Muscular re-education, Balance training, Gait training, Patient/Family education, Joint mobilization, Stair training, DME instructions, Dry Needling, Electrical stimulation, Cryotherapy, vasopneumatic device,Traction, Moist heat, Taping, Ultrasound, Ionotophoresis 4mg /ml Dexamethasone, and aquatic therapy, Manual therapy.  All included unless contraindicated  PLAN FOR NEXT SESSION: Check HEP use/response, mobs as needed, DN, E-stim    Sharmon Leyden, PT, MPT 11/20/2022, 11:18 AM

## 2022-11-21 ENCOUNTER — Ambulatory Visit: Payer: Self-pay | Admitting: Physical Therapy

## 2022-11-27 ENCOUNTER — Ambulatory Visit (INDEPENDENT_AMBULATORY_CARE_PROVIDER_SITE_OTHER): Payer: Self-pay | Admitting: Orthopaedic Surgery

## 2022-11-27 ENCOUNTER — Telehealth: Payer: Self-pay | Admitting: Physical Therapy

## 2022-11-27 ENCOUNTER — Encounter: Payer: Self-pay | Admitting: Physical Therapy

## 2022-11-27 DIAGNOSIS — G5601 Carpal tunnel syndrome, right upper limb: Secondary | ICD-10-CM

## 2022-11-27 MED ORDER — LIDOCAINE HCL 1 % IJ SOLN
1.0000 mL | INTRAMUSCULAR | Status: AC | PRN
Start: 2022-11-27 — End: 2022-11-27
  Administered 2022-11-27: 1 mL

## 2022-11-27 MED ORDER — METHYLPREDNISOLONE ACETATE 40 MG/ML IJ SUSP
40.0000 mg | INTRAMUSCULAR | Status: AC | PRN
Start: 2022-11-27 — End: 2022-11-27
  Administered 2022-11-27: 40 mg

## 2022-11-27 MED ORDER — BUPIVACAINE HCL 0.5 % IJ SOLN
1.0000 mL | INTRAMUSCULAR | Status: AC | PRN
Start: 2022-11-27 — End: 2022-11-27
  Administered 2022-11-27: 1 mL

## 2022-11-27 NOTE — Progress Notes (Signed)
Office Visit Note   Patient: Andrew Sandoval           Date of Birth: 04/18/1981           MRN: [id number] Visit Date: 11/27/2022              Requested by: Hoy Register, MD [address] [place],  [place] [address] PCP: Hoy Register, MD   Assessment & Plan: Visit Diagnoses:  1. Carpal tunnel syndrome, right upper limb     Plan: Impression is right hand carpal tunnel syndrome moderate to severe nature.  We again discussed various treatment options to include surgical intervention which I recommended based on the severity of compression.  He is not interested in proceeding with surgery but would like to repeat cortisone injection today.  He will follow-up with [place] as needed.  This entire encounter was discussed through Spanish speak interpreter.  Follow-Up Instructions: Return if symptoms worsen or fail to improve.   Orders:  Orders Placed This Encounter  Procedures   Hand/UE Inj   No orders of the defined types were placed in this encounter.     Procedures: Hand/UE Inj: R carpal tunnel for carpal tunnel syndrome on 11/27/2022 9:04 AM Indications: pain Details: 25 G needle Medications: 1 mL lidocaine 1 %; 1 mL bupivacaine 0.5 %; 40 mg methylPREDNISolone acetate 40 MG/ML Outcome: tolerated well, no immediate complications Patient was prepped and draped in the usual sterile fashion.       Clinical Data: No additional findings.   Subjective: Chief Complaint  Patient presents with   Right Wrist - Pain    HPI patient is a pleasant 42 year old Spanish-speaking gentleman is here today with recurrent right hand paresthesias.  History of moderate to severe carpal tunnel syndrome on the right.  He was seen by [place] on 08/11/2022 where carpal tunnel injection was performed.  He had significant relief until recently.  His symptoms have returned.  Symptoms are worse at night as well as after he is cooking and washing dishes.  The paresthesias he has are to the  entire hand.  Review of Systems as detailed in HPI.  All others reviewed and are negative.   Objective: Vital Signs: There were no vitals taken for this visit.  Physical Exam well-developed well-nourished gentleman in no acute distress.  Alert and oriented x 3.  Ortho Exam right hand exam reveals positive Phalen and positive Tinel at the wrist.  No thenar atrophy.  He is neurovascularly intact distally.  Specialty Comments:  No specialty comments available.  Imaging: No new imaging   PMFS History: Patient Active Problem List   Diagnosis Date Noted   Bilateral carpal tunnel syndrome 02/02/2020   Chronic pruritic rash in adult 10/21/2019   Pain in both hands 08/18/2019   Acute pain of right shoulder 08/18/2019   Hyperlipidemia 06/09/2017   Enlarged tonsils 11/11/2016   Tinea pedis of right foot 11/11/2016   Numbness 11/11/2016   Positive H. pylori test 07/23/2016   Chronic nasal congestion 01/18/2015   Past Medical History:  Diagnosis Date   Cocaine use 2006-2010   Hyperlipidemia 06/09/2017    Family History  Problem Relation Age of Onset   Diabetes Neg Hx     Past Surgical History:  Procedure Laterality Date   No prior surgery     Social History   Occupational History    Comment: [employer]  Tobacco Use   Smoking status: Former   Smokeless tobacco: Never  Tobacco comments:    quit 12 years ago  Vaping Use   Vaping Use: Never used  Substance and Sexual Activity   Alcohol use: No    Alcohol/week: 0.0 standard drinks of alcohol    Comment: quit 12 years ago   Drug use: Not Currently    Types: Cocaine, Marijuana    Comment: Prior cocaine; none in 11 years   Sexual activity: Yes

## 2022-11-27 NOTE — Telephone Encounter (Signed)
Pt did not show for PT appointment today. They were contacted and informed of this via voicemail. They were provided the date and time of their next appointment on voicemail.   Ivery Quale, PT, DPT 11/27/22 1:19 PM

## 2022-12-04 ENCOUNTER — Encounter: Payer: Self-pay | Admitting: Physical Therapy

## 2022-12-04 ENCOUNTER — Ambulatory Visit (INDEPENDENT_AMBULATORY_CARE_PROVIDER_SITE_OTHER): Payer: Self-pay | Admitting: Physical Therapy

## 2022-12-04 DIAGNOSIS — M436 Torticollis: Secondary | ICD-10-CM

## 2022-12-04 DIAGNOSIS — M6281 Muscle weakness (generalized): Secondary | ICD-10-CM

## 2022-12-04 DIAGNOSIS — M542 Cervicalgia: Secondary | ICD-10-CM

## 2022-12-04 NOTE — Therapy (Signed)
OUTPATIENT PHYSICAL THERAPY TREATMENT   Patient Name: Nehal Shives MRN: [id number] DOB:1980/06/24, 42 y.o., male Today's Date: 12/04/2022  END OF SESSION:  PT End of Session - 12/04/22 1333     Visit Number 2    Number of Visits 16    Date for PT Re-Evaluation 01/17/23    PT Start Time 1300    PT Stop Time 1340    PT Time Calculation (min) 40 min    Activity Tolerance Patient tolerated treatment well    Behavior During Therapy [facility] for tasks assessed/performed              Past Medical History:  Diagnosis Date   Cocaine use 2006-2010   Hyperlipidemia 06/09/2017   Past Surgical History:  Procedure Laterality Date   No prior surgery     Patient Active Problem List   Diagnosis Date Noted   Bilateral carpal tunnel syndrome 02/02/2020   Chronic pruritic rash in adult 10/21/2019   Pain in both hands 08/18/2019   Acute pain of right shoulder 08/18/2019   Hyperlipidemia 06/09/2017   Enlarged tonsils 11/11/2016   Tinea pedis of right foot 11/11/2016   Numbness 11/11/2016   Positive H. pylori test 07/23/2016   Chronic nasal congestion 01/18/2015    PCP: Delma Officer, MD  REFERRING PROVIDER: Delma Officer, MD  REFERRING DIAG: M54.2 (ICD-10-CM) - Neck pain  THERAPY DIAG:  Muscle weakness (generalized)  Cervicalgia  Stiffness of neck  Rationale for Evaluation and Treatment: Rehabilitation  ONSET DATE:  2-3 months                       SUBJECTIVE:                                                                                                                                                                                                 SUBJECTIVE STATEMENT: He states the pain is overall better  PERTINENT HISTORY:  Bil CTS, left shoulder pain  PAIN:  NPRS scale: 5/10 today Pain location: left upper trap, neck Pain description: achy Aggravating factors: turning to Rt Relieving factors: nothing  PRECAUTIONS: None  WEIGHT BEARING  RESTRICTIONS: No  FALLS:  Has patient fallen in last 6 months? No  LIVING ENVIRONMENT: Lives with: lives with their family and lives with their spouse Lives in: House/apartment Stairs: yes outside Has following equipment at home: None  OCCUPATION: works at [employer]  PLOF: Independent  PATIENT GOALS: Stop hurting so I can work  Next MD visit:  OBJECTIVE:   DIAGNOSTIC FINDINGS:  11/20/22: No acute or structural abnormality Left  shoulder  from 08/14/22  PATIENT SURVEYS:  11/20/22: FOTO intake:  45%     COGNITION: Overall cognitive status: Within functional limits for tasks assessed  SENSATION: WFL  POSTURE:  No Significant postural limitations  PALPATION: 11/20/22: TTP: cervical paraspinals, bil upper trap   CERVICAL ROM:   ROM AROM (deg) 11/20/22  Flexion 40  Extension 10  Right lateral flexion 15  Left lateral flexion 12  Right rotation 15  Left rotation 10   11/20/22: all measurements limited by pain  (Blank rows = not tested)  UPPER EXTREMITY ROM:   ROM Right 11/20/22 Left 11/20/22  Shoulder flexion 120 100  Shoulder extension 30 30  Shoulder abduction    Shoulder adduction    Shoulder extension    Shoulder internal rotation    Shoulder external rotation    Elbow flexion    Elbow extension    Wrist flexion    Wrist extension    Wrist ulnar deviation    Wrist radial deviation    Wrist pronation    Wrist supination     (Blank rows = not tested)  UPPER EXTREMITY MMT:  MMT Right 11/20/22 Left 11/20/22  Shoulder flexion 5 4  Shoulder extension 5 4  Shoulder abduction 4 4  Shoulder adduction    Shoulder extension    Shoulder internal rotation 4 4  Shoulder external rotation 4 4  Middle trapezius    Lower trapezius    Grip strength     (Blank rows = not tested)                                                                                                                                                                                     TODAY'S TREATMENT:                                                                                                       DATE: 12/04/22 Therex:  HEP reviewed with cues and demo for technique Upper trap stretch 10 sec X 5 Levator stretch 10 sec X 5 Cervical retraction X 10 Cervical rotation AAROM with strap X 10 Scapular retraction X 10 Manual:  Skilled palpation of active trigger points during dry needling STM performed to left upper  trap Trigger Point Dry-Needling  Treatment instructions: Expect mild to moderate muscle soreness. S/S of pneumothorax if dry needled over a lung field, and to seek immediate medical attention should they occur. Patient verbalized understanding of these instructions and education.  Patient Consent Given: Yes Education handout provided: No, handout instructions were verbally explained before treatment via interpreter Muscles treated: left upper trap, left levator, left cervical P.S and multifidi Treatment response/outcome: multiple twitch responses noted, pt with improvements in ROM and cervical rotation following treatment Modalities:  IFC, Left upper trap/neck: x 12 minutes, intensity to tolerance L16, combined with ice pack around neck as well.  PATIENT EDUCATION:  Education details: HEP, POC Person educated: Patient Education method: Programmer, multimedia, Demonstration, Verbal cues, and Handouts Education comprehension: verbalized understanding, returned demonstration, and verbal cues required  HOME EXERCISE PROGRAM: Access Code: [email or web address] URL: [email or web address] Date: 11/20/2022 Prepared by: Narda Amber  Exercises - Supine Cervical Retraction with Towel  - 3 x daily - 7 x weekly - 10 reps - 5 seconds hold - Seated Upper Trapezius Stretch  - 3 x daily - 7 x weekly - 5 reps - 10 seconds hold - Gentle Levator Scapulae Stretch  - 3 x daily - 7 x weekly - 5 reps - 10 seconds hold  ASSESSMENT:  CLINICAL IMPRESSION: He had some  improvements from last time so repeated treatments. I did review HEP with him and recommended he get home TENS unit if he finds treatment to be beneficial. He will continue to benefit from skilled PT services to address impairments and limitations to improve to previous level of function without restriction secondary to condition.    OBJECTIVE IMPAIRMENTS: decreased ROM, decreased strength, impaired flexibility, impaired UE functional use, postural dysfunction, and pain.   ACTIVITY LIMITATIONS: lifting and sleeping  PARTICIPATION LIMITATIONS: community activity and occupation  PERSONAL FACTORS: 1-2 comorbidities: see pertinent history above  are also affecting patient's functional outcome.   REHAB POTENTIAL: Good  CLINICAL DECISION MAKING: Stable/uncomplicated  EVALUATION COMPLEXITY: Low   GOALS: Goals reviewed with patient? Yes  SHORT TERM GOALS: (target date for Short term goals are 3 weeks 12/13/22)  1.Patient will demonstrate independent use of home exercise program to maintain progress from in clinic treatments. Goal status: New  LONG TERM GOALS: (target dates for all long term goals are 8 weeks  01/17/23 )   1. Patient will demonstrate/report pain at worst less than or equal to 2/10 to facilitate minimal limitation in daily activity secondary to pain symptoms. Goal status: New   2. Patient will demonstrate independent use of home exercise program to facilitate ability to maintain/progress functional gains from skilled physical therapy services. Goal status: New   3. Patient will demonstrate FOTO outcome > or = 66 % to indicate reduced disability due to condition. Goal status: New   4.  Patient will demonstrate cervical AROM WFL s symptoms to facilitate usual head movements for daily activity including driving, self care.   Goal status: New   5.  Pt will improve bilateral cervical rotation to >/= 60 degrees for improved functional mobility.   Goal status: New   6.  pt  will be able to lift 15 # from floor to over head shelf using bil UEs with pain </= 2/10 in his left upper trap. Goal status: New    PLAN:  PT FREQUENCY: 1-2x/week  PT DURATION: 10 weeks  PLANNED INTERVENTIONS: Therapeutic exercises, Therapeutic activity, Neuro Muscular re-education, Balance training, Gait training, Patient/Family education, Joint mobilization, Stair training, DME  instructions, Dry Needling, Electrical stimulation, Cryotherapy, vasopneumatic device,Traction, Moist heat, Taping, Ultrasound, Ionotophoresis 4mg /ml Dexamethasone, and aquatic therapy, Manual therapy.  All included unless contraindicated  PLAN FOR NEXT SESSION: Check HEP use/response, mobs as needed, DN, E-stim    April Manson, PT, DPT 12/04/2022, 1:33 PM

## 2022-12-11 ENCOUNTER — Encounter: Payer: Self-pay | Admitting: Physical Therapy

## 2022-12-16 NOTE — Therapy (Signed)
OUTPATIENT PHYSICAL THERAPY TREATMENT   Patient Name: Andrew Sandoval MRN: [id number] DOB:May 12, 1981, 42 y.o., male Today's Date: 12/17/2022  END OF SESSION:  PT End of Session - 12/17/22 0757     Visit Number 3    Number of Visits 16    Date for PT Re-Evaluation 01/17/23    Authorization Type CAFA    Progress Note Due on Visit 10    PT Start Time 0759    PT Stop Time 0834    PT Time Calculation (min) 35 min    Activity Tolerance Patient tolerated treatment well    Behavior During Therapy [facility] for tasks assessed/performed               Past Medical History:  Diagnosis Date   Cocaine use 2006-2010   Hyperlipidemia 06/09/2017   Past Surgical History:  Procedure Laterality Date   No prior surgery     Patient Active Problem List   Diagnosis Date Noted   Bilateral carpal tunnel syndrome 02/02/2020   Chronic pruritic rash in adult 10/21/2019   Pain in both hands 08/18/2019   Acute pain of right shoulder 08/18/2019   Hyperlipidemia 06/09/2017   Enlarged tonsils 11/11/2016   Tinea pedis of right foot 11/11/2016   Numbness 11/11/2016   Positive H. pylori test 07/23/2016   Chronic nasal congestion 01/18/2015    PCP: Delma Officer, MD  REFERRING PROVIDER: Delma Officer, MD  REFERRING DIAG: M54.2 (ICD-10-CM) - Neck pain  THERAPY DIAG:  Muscle weakness (generalized)  Cervicalgia  Stiffness of neck  Rationale for Evaluation and Treatment: Rehabilitation  ONSET DATE:  2-3 months                       SUBJECTIVE:                                                                                                                                                                                                 SUBJECTIVE STATEMENT: He indicated doing much better and able to turn head better.   He reported some increase with "lots of activity."   Communication through in person interpreter.   PERTINENT HISTORY:  Bil CTS, left shoulder pain  PAIN:  NPRS  scale: up to 6/10 in last week Pain location: left upper trap, neck Pain description: achy Aggravating factors: head turning Relieving factors: nothing  PRECAUTIONS: None  WEIGHT BEARING RESTRICTIONS: No  FALLS:  Has patient fallen in last 6 months? No  LIVING ENVIRONMENT: Lives with: lives with their family and lives with their spouse Lives in: House/apartment Stairs: yes outside  Has following equipment at home: None  OCCUPATION: works at [employer]  PLOF: Independent  PATIENT GOALS: Stop hurting so I can work  Next MD visit:  OBJECTIVE:   DIAGNOSTIC FINDINGS:  11/20/22: No acute or structural abnormality Left shoulder  from 08/14/22  PATIENT SURVEYS:   11/20/22: FOTO intake:  45%     COGNITION: Overall cognitive status: Within functional limits for tasks assessed  SENSATION: WFL  POSTURE:  No Significant postural limitations  PALPATION: 11/20/22: TTP: cervical paraspinals, bil upper trap   CERVICAL ROM:   ROM AROM (deg) 11/20/22 AROM 12/17/2022  Flexion 40 38 c pain  Extension 10 45 c pain  Right lateral flexion 15   Left lateral flexion 12   Right rotation 15 35 c pain Lt cervical  (70 after needling/manual)  Left rotation 10 48 (65 after needling/manual)   11/20/22: all measurements limited by pain  (Blank rows = not tested)  UPPER EXTREMITY ROM:   ROM Right 11/20/22 Left 11/20/22  Shoulder flexion 120 100  Shoulder extension 30 30  Shoulder abduction    Shoulder adduction    Shoulder extension    Shoulder internal rotation    Shoulder external rotation    Elbow flexion    Elbow extension    Wrist flexion    Wrist extension    Wrist ulnar deviation    Wrist radial deviation    Wrist pronation    Wrist supination     (Blank rows = not tested)  UPPER EXTREMITY MMT:  MMT Right 11/20/22 Left 11/20/22  Shoulder flexion 5 4  Shoulder extension 5 4  Shoulder abduction 4 4  Shoulder adduction    Shoulder extension    Shoulder internal  rotation 4 4  Shoulder external rotation 4 4  Middle trapezius    Lower trapezius    Grip strength     (Blank rows = not tested)                                                                                                                                                                                   TODAY'S TREATMENT:  DATE:  12/16/2022 Therex: Seated Lt upper trap self stretch 15 sec x 5  Cervical rotation x 10 Lt and Rt seated Seated scapular retraction c bilateral UE ER green band 2 x 10   Manual:  STM performed to left upper trap.  G3 cPA T4-T9   Trigger Point Dry-Needling  Treatment instructions: Expect mild to moderate muscle soreness. S/S of pneumothorax if dry needled over a lung field, and to seek immediate medical attention should they occur. Patient verbalized understanding of these instructions and education.  Patient Consent Given: Yes Education handout provided: no - previously discussed through interpreter.  Muscles treated: left upper trap Treatment response/outcome:  Concordant twitch response  Modalities:  Pre mod Lt upper trap region post treatment 10 mins to tolerance   TODAY'S TREATMENT:                                                                                                       DATE: 12/04/22 Therex:  HEP reviewed with cues and demo for technique Upper trap stretch 10 sec X 5 Levator stretch 10 sec X 5 Cervical retraction X 10 Cervical rotation AAROM with strap X 10 Scapular retraction X 10 Manual:  Skilled palpation of active trigger points during dry needling STM performed to left upper trap Trigger Point Dry-Needling  Treatment instructions: Expect mild to moderate muscle soreness. S/S of pneumothorax if dry needled over a lung field, and to seek immediate medical attention should they occur. Patient verbalized understanding of these  instructions and education.  Patient Consent Given: Yes Education handout provided: No, handout instructions were verbally explained before treatment via interpreter Muscles treated: left upper trap, left levator, left cervical P.S and multifidi Treatment response/outcome: multiple twitch responses noted, pt with improvements in ROM and cervical rotation following treatment Modalities:  IFC, Left upper trap/neck: x 12 minutes, intensity to tolerance L16, combined with ice pack around neck as well.  PATIENT EDUCATION:  12/17/2022 Education details: HEP update, DN paper Person educated: Patient Education method: Explanation, Demonstration, Verbal cues, and Handouts Education comprehension: verbalized understanding, returned demonstration, and verbal cues required  HOME EXERCISE PROGRAM: Access Code: [email or web address] URL: [email or web address] Date: 12/17/2022 Prepared by: Chyrel Masson  Exercises - Supine Cervical Retraction with Towel  - 3 x daily - 7 x weekly - 10 reps - 5 seconds hold - Seated Upper Trapezius Stretch (Mirrored)  - 3 x daily - 7 x weekly - 5 reps - 10 seconds hold - Gentle Levator Scapulae Stretch  - 3 x daily - 7 x weekly - 5 reps - 10 seconds hold - Seated Cervical Rotation AROM  - 2-3 x daily - 7 x weekly - 1 sets - 10 reps - Shoulder External Rotation and Scapular Retraction with Resistance  - 1-2 x daily - 7 x weekly - 1-2 sets - 10-15 reps - Standing Shoulder Row with Anchored Resistance  - 1-2 x daily - 7 x weekly - 1-2 sets - 10-15 reps  ASSESSMENT:  CLINICAL IMPRESSION: Noted improvement in cervical range of motion and symptoms immediately after manual and dry  needling intervention as documented above.  Pt could benefit from shorter periods of time between visits to improve building off gains from each manual intervention.  Pt to benefit from continued skilled PT services at this time to progress.    OBJECTIVE IMPAIRMENTS: decreased ROM, decreased  strength, impaired flexibility, impaired UE functional use, postural dysfunction, and pain.   ACTIVITY LIMITATIONS: lifting and sleeping  PARTICIPATION LIMITATIONS: community activity and occupation  PERSONAL FACTORS: 1-2 comorbidities: see pertinent history above  are also affecting patient's functional outcome.   REHAB POTENTIAL: Good  CLINICAL DECISION MAKING: Stable/uncomplicated  EVALUATION COMPLEXITY: Low   GOALS: Goals reviewed with patient? Yes  SHORT TERM GOALS: (target date for Short term goals are 3 weeks 12/13/22)  1.Patient will demonstrate independent use of home exercise program to maintain progress from in clinic treatments. Goal status: on going 12/17/2022  LONG TERM GOALS: (target dates for all long term goals are 8 weeks  01/17/23 )   1. Patient will demonstrate/report pain at worst less than or equal to 2/10 to facilitate minimal limitation in daily activity secondary to pain symptoms. Goal status: on going 12/17/2022   2. Patient will demonstrate independent use of home exercise program to facilitate ability to maintain/progress functional gains from skilled physical therapy services. Goal status: on going 12/17/2022   3. Patient will demonstrate FOTO outcome > or = 66 % to indicate reduced disability due to condition. Goal status: on going 12/17/2022   4.  Patient will demonstrate cervical AROM WFL s symptoms to facilitate usual head movements for daily activity including driving, self care.   Goal status: on going 12/17/2022   5.  Pt will improve bilateral cervical rotation to >/= 60 degrees for improved functional mobility.   Goal status: on going 12/17/2022   6.  pt will be able to lift 15 # from floor to over head shelf using bil UEs with pain </= 2/10 in his left upper trap. Goal status: on going 12/17/2022    PLAN:  PT FREQUENCY: 1-2x/week  PT DURATION: 10 weeks  PLANNED INTERVENTIONS: Therapeutic exercises, Therapeutic activity, Neuro Muscular  re-education, Balance training, Gait training, Patient/Family education, Joint mobilization, Stair training, DME instructions, Dry Needling, Electrical stimulation, Cryotherapy, vasopneumatic device,Traction, Moist heat, Taping, Ultrasound, Ionotophoresis 4mg /ml Dexamethasone, and aquatic therapy, Manual therapy.  All included unless contraindicated  PLAN FOR NEXT SESSION: DN as necessary.  FOTO recheck (been 30 days),   Chyrel Masson, PT, DPT, OCS, ATC 12/17/22  8:31 AM

## 2022-12-17 ENCOUNTER — Encounter: Payer: Self-pay | Admitting: Rehabilitative and Restorative Service Providers"

## 2022-12-17 ENCOUNTER — Ambulatory Visit (INDEPENDENT_AMBULATORY_CARE_PROVIDER_SITE_OTHER): Payer: Self-pay | Admitting: Rehabilitative and Restorative Service Providers"

## 2022-12-17 DIAGNOSIS — M542 Cervicalgia: Secondary | ICD-10-CM

## 2022-12-17 DIAGNOSIS — M6281 Muscle weakness (generalized): Secondary | ICD-10-CM

## 2022-12-17 DIAGNOSIS — M436 Torticollis: Secondary | ICD-10-CM

## 2022-12-18 ENCOUNTER — Encounter: Payer: Self-pay | Admitting: Physical Therapy

## 2022-12-24 ENCOUNTER — Encounter: Payer: Self-pay | Admitting: Physical Therapy

## 2022-12-26 ENCOUNTER — Other Ambulatory Visit: Payer: Self-pay

## 2022-12-26 ENCOUNTER — Other Ambulatory Visit: Payer: Self-pay | Admitting: Family Medicine

## 2022-12-26 DIAGNOSIS — J328 Other chronic sinusitis: Secondary | ICD-10-CM

## 2022-12-26 DIAGNOSIS — L309 Dermatitis, unspecified: Secondary | ICD-10-CM

## 2022-12-26 MED ORDER — BETAMETHASONE DIPROPIONATE 0.05 % EX CREA
TOPICAL_CREAM | Freq: Two times a day (BID) | CUTANEOUS | 1 refills | Status: DC
Start: 2022-12-26 — End: 2023-01-23
  Filled 2022-12-26: qty 45, 30d supply, fill #0

## 2022-12-26 MED ORDER — FLUTICASONE PROPIONATE 50 MCG/ACT NA SUSP
2.0000 | Freq: Every day | NASAL | 1 refills | Status: DC
Start: 2022-12-26 — End: 2023-02-14
  Filled 2022-12-26: qty 16, 30d supply, fill #0

## 2023-01-01 ENCOUNTER — Other Ambulatory Visit: Payer: Self-pay

## 2023-01-06 ENCOUNTER — Encounter: Payer: Self-pay | Admitting: Physical Therapy

## 2023-01-08 ENCOUNTER — Ambulatory Visit (INDEPENDENT_AMBULATORY_CARE_PROVIDER_SITE_OTHER): Payer: Self-pay | Admitting: Physical Therapy

## 2023-01-08 ENCOUNTER — Encounter: Payer: Self-pay | Admitting: Physical Therapy

## 2023-01-08 DIAGNOSIS — M6281 Muscle weakness (generalized): Secondary | ICD-10-CM

## 2023-01-08 DIAGNOSIS — M542 Cervicalgia: Secondary | ICD-10-CM

## 2023-01-08 DIAGNOSIS — M436 Torticollis: Secondary | ICD-10-CM

## 2023-01-08 DIAGNOSIS — M25512 Pain in left shoulder: Secondary | ICD-10-CM

## 2023-01-08 NOTE — Therapy (Addendum)
OUTPATIENT PHYSICAL THERAPY TREATMENT / DISCHARGE   Patient Name: Andrew Sandoval MRN: [id number] DOB:July 04, 1980, 42 y.o., male Today's Date: 01/08/2023  END OF SESSION:  PT End of Session - 01/08/23 0802     Visit Number 4    Number of Visits 16    Date for PT Re-Evaluation 01/17/23    Authorization Type CAFA    Progress Note Due on Visit 10    PT Start Time 0800    PT Stop Time 0838    PT Time Calculation (min) 38 min    Activity Tolerance Patient tolerated treatment well    Behavior During Therapy [facility] for tasks assessed/performed               Past Medical History:  Diagnosis Date   Cocaine use 2006-2010   Hyperlipidemia 06/09/2017   Past Surgical History:  Procedure Laterality Date   No prior surgery     Patient Active Problem List   Diagnosis Date Noted   Bilateral carpal tunnel syndrome 02/02/2020   Chronic pruritic rash in adult 10/21/2019   Pain in both hands 08/18/2019   Acute pain of right shoulder 08/18/2019   Hyperlipidemia 06/09/2017   Enlarged tonsils 11/11/2016   Tinea pedis of right foot 11/11/2016   Numbness 11/11/2016   Positive H. pylori test 07/23/2016   Chronic nasal congestion 01/18/2015    PCP: Delma Officer, MD  REFERRING PROVIDER: Delma Officer, MD  REFERRING DIAG: M54.2 (ICD-10-CM) - Neck pain  THERAPY DIAG:  Muscle weakness (generalized)  Cervicalgia  Stiffness of neck  Acute pain of left shoulder  Rationale for Evaluation and Treatment: Rehabilitation  ONSET DATE:  2-3 months                       SUBJECTIVE:                                                                                                                                                                                                 SUBJECTIVE STATEMENT: He reports pain is bad 8/10. He cannot sleep on that side, still has a lot of difficulty with activity.   Communication through in person interpreter.   PERTINENT HISTORY:  Bil CTS, left  shoulder pain  PAIN:  NPRS scale:8/10 today Pain location: left upper trap, neck Pain description: achy Aggravating factors: head turning Relieving factors: nothing  PRECAUTIONS: None  WEIGHT BEARING RESTRICTIONS: No  FALLS:  Has patient fallen in last 6 months? No  LIVING ENVIRONMENT: Lives with: lives with their family and lives with their spouse Lives in: House/apartment Stairs:  yes outside Has following equipment at home: None  OCCUPATION: works at [employer]  PLOF: Independent  PATIENT GOALS: Stop hurting so I can work  Next MD visit:  OBJECTIVE:   DIAGNOSTIC FINDINGS:  11/20/22: No acute or structural abnormality Left shoulder  from 08/14/22  PATIENT SURVEYS:   11/20/22: FOTO intake:  45%   01/08/23 FOTO 50%   COGNITION: Overall cognitive status: Within functional limits for tasks assessed  SENSATION: WFL  POSTURE:  No Significant postural limitations  PALPATION: 11/20/22: TTP: cervical paraspinals, bil upper trap   CERVICAL ROM:   ROM AROM (deg) 11/20/22 AROM 12/17/2022 AROM 01/08/23  Flexion 40 38 c pain 40 with pain  Extension 10 45 c pain 40 with pain  Right lateral flexion 15  20 with pain  Left lateral flexion 12  20 with pain  Right rotation 15 35 c pain Lt cervical  (70 after needling/manual) 40 with pain  Left rotation 10 48 (65 after needling/manual) 45 with pain   11/20/22: all measurements limited by pain  (Blank rows = not tested)  UPPER EXTREMITY ROM:   ROM Right 11/20/22 Left 11/20/22 R/L 01/08/23  Shoulder flexion 120 100 130/130  Shoulder extension 30 30   Shoulder abduction     Shoulder adduction     Shoulder extension     Shoulder internal rotation     Shoulder external rotation     Elbow flexion     Elbow extension     Wrist flexion     Wrist extension     Wrist ulnar deviation     Wrist radial deviation     Wrist pronation     Wrist supination      (Blank rows = not tested)  UPPER EXTREMITY MMT:  MMT  Right 11/20/22 Left 11/20/22 Right/Left 01/08/23  Shoulder flexion 5 4 5/4  Shoulder extension 5 4 5/4  Shoulder abduction 4 4   Shoulder adduction     Shoulder extension     Shoulder internal rotation 4 4 5/4  Shoulder external rotation 4 4 5/4  Middle trapezius     Lower trapezius     Grip strength      (Blank rows = not tested)                                                                                                                                                                                   TODAY'S TREATMENT:  01/08/23 Self care: He brought in his on TENS unit and we reviewed how to set this up and how to use it. Also discussed his future plan of care to discharge from PT due to lack of significant progress and continued pain and recommend he follow back up for MD for further evaluation and treatment. Therex: Updated measurements, FOTO, PT goals Recommendations not to stretch into pain with his HEP  Iontophoresis 4-6 hour wear home patch with 1.0CC dexamethasone and saline to left upper trap. Verbal instructions and education provided.    DATE:  12/16/2022 Therex: Seated Lt upper trap self stretch 15 sec x 5  Cervical rotation x 10 Lt and Rt seated Seated scapular retraction c bilateral UE ER green band 2 x 10   Manual:  STM performed to left upper trap.  G3 cPA T4-T9   Trigger Point Dry-Needling  Treatment instructions: Expect mild to moderate muscle soreness. S/S of pneumothorax if dry needled over a lung field, and to seek immediate medical attention should they occur. Patient verbalized understanding of these instructions and education.  Patient Consent Given: Yes Education handout provided: no - previously discussed through interpreter.  Muscles treated: left upper trap Treatment response/outcome:  Concordant twitch response  Modalities:  Pre mod Lt  upper trap region post treatment 10 mins to tolerance   TODAY'S TREATMENT:                                                                                                       DATE: 12/04/22 Therex:  HEP reviewed with cues and demo for technique Upper trap stretch 10 sec X 5 Levator stretch 10 sec X 5 Cervical retraction X 10 Cervical rotation AAROM with strap X 10 Scapular retraction X 10 Manual:  Skilled palpation of active trigger points during dry needling STM performed to left upper trap Trigger Point Dry-Needling  Treatment instructions: Expect mild to moderate muscle soreness. S/S of pneumothorax if dry needled over a lung field, and to seek immediate medical attention should they occur. Patient verbalized understanding of these instructions and education.  Patient Consent Given: Yes Education handout provided: No, handout instructions were verbally explained before treatment via interpreter Muscles treated: left upper trap, left levator, left cervical P.S and multifidi Treatment response/outcome: multiple twitch responses noted, pt with improvements in ROM and cervical rotation following treatment Modalities:  IFC, Left upper trap/neck: x 12 minutes, intensity to tolerance L16, combined with ice pack around neck as well.  PATIENT EDUCATION:  12/17/2022 Education details: HEP update, DN paper Person educated: Patient Education method: Explanation, Demonstration, Verbal cues, and Handouts Education comprehension: verbalized understanding, returned demonstration, and verbal cues required  HOME EXERCISE PROGRAM: Access Code: [email or web address] URL: [email or web address] Date: 12/17/2022 Prepared by: Chyrel Masson  Exercises - Supine Cervical Retraction with Towel  - 3 x daily - 7 x weekly - 10 reps - 5 seconds hold - Seated Upper Trapezius Stretch (Mirrored)  - 3 x daily - 7 x weekly - 5 reps - 10 seconds hold - Gentle Levator Scapulae Stretch  - 3 x daily -  7 x weekly  - 5 reps - 10 seconds hold - Seated Cervical Rotation AROM  - 2-3 x daily - 7 x weekly - 1 sets - 10 reps - Shoulder External Rotation and Scapular Retraction with Resistance  - 1-2 x daily - 7 x weekly - 1-2 sets - 10-15 reps - Standing Shoulder Row with Anchored Resistance  - 1-2 x daily - 7 x weekly - 1-2 sets - 10-15 reps  ASSESSMENT:  CLINICAL IMPRESSION: He denies any significant pain relief in his left neck/upper trap area with PT thus far. He has made small objective improvements in ROM however his pain levels and reported functional difficulty has not improved. We have tried DN, TENS, and stretching program. I do recommend referral back to MD at this point for further evaluation and treatment options. I did try iontophoresis today in efforts to reduce his pain. I will hold PT open 30 days in case the ionto drastically helps then he may return to have that again. He verbalizes understanding of these recommendations.    OBJECTIVE IMPAIRMENTS: decreased ROM, decreased strength, impaired flexibility, impaired UE functional use, postural dysfunction, and pain.   ACTIVITY LIMITATIONS: lifting and sleeping  PARTICIPATION LIMITATIONS: community activity and occupation  PERSONAL FACTORS: 1-2 comorbidities: see pertinent history above  are also affecting patient's functional outcome.   REHAB POTENTIAL: Good  CLINICAL DECISION MAKING: Stable/uncomplicated  EVALUATION COMPLEXITY: Low   GOALS: Goals reviewed with patient? Yes  SHORT TERM GOALS: (target date for Short term goals are 3 weeks 12/13/22)  1.Patient will demonstrate independent use of home exercise program to maintain progress from in clinic treatments. Goal status: on going 12/17/2022  LONG TERM GOALS: (target dates for all long term goals are 8 weeks  01/17/23 )   1. Patient will demonstrate/report pain at worst less than or equal to 2/10 to facilitate minimal limitation in daily activity secondary to pain symptoms. Goal  status: on going 12/17/2022   2. Patient will demonstrate independent use of home exercise program to facilitate ability to maintain/progress functional gains from skilled physical therapy services. Goal status: on going 12/17/2022   3. Patient will demonstrate FOTO outcome > or = 66 % to indicate reduced disability due to condition. Goal status: on going 12/17/2022   4.  Patient will demonstrate cervical AROM WFL s symptoms to facilitate usual head movements for daily activity including driving, self care.   Goal status: on going 12/17/2022   5.  Pt will improve bilateral cervical rotation to >/= 60 degrees for improved functional mobility.   Goal status: on going 12/17/2022   6.  pt will be able to lift 15 # from floor to over head shelf using bil UEs with pain </= 2/10 in his left upper trap. Goal status: on going 12/17/2022    PLAN:  PT FREQUENCY: 1-2x/week  PT DURATION: 10 weeks  PLANNED INTERVENTIONS: Therapeutic exercises, Therapeutic activity, Neuro Muscular re-education, Balance training, Gait training, Patient/Family education, Joint mobilization, Stair training, DME instructions, Dry Needling, Electrical stimulation, Cryotherapy, vasopneumatic device,Traction, Moist heat, Taping, Ultrasound, Ionotophoresis 4mg /ml Dexamethasone, and aquatic therapy, Manual therapy.  All included unless contraindicated  PLAN FOR NEXT SESSION: hold PT for 30 days and refer back to MD, he may return for ionto if it was helpful.   Ivery Quale, PT, DPT 01/08/23 8:03 AM   PHYSICAL THERAPY DISCHARGE SUMMARY  Visits from Start of Care: 4  Current functional level related to goals / functional outcomes: See note   Remaining  deficits: See note   Education / Equipment: HEP  Patient goals were partially met. Patient is being discharged due to not returning since the last visit.  Chyrel Masson, PT, DPT, OCS, ATC 02/19/23  3:51 PM

## 2023-01-09 ENCOUNTER — Other Ambulatory Visit: Payer: Self-pay

## 2023-01-15 ENCOUNTER — Encounter: Payer: Self-pay | Admitting: Orthopaedic Surgery

## 2023-01-15 ENCOUNTER — Ambulatory Visit (INDEPENDENT_AMBULATORY_CARE_PROVIDER_SITE_OTHER): Payer: Self-pay | Admitting: Orthopaedic Surgery

## 2023-01-15 ENCOUNTER — Other Ambulatory Visit (INDEPENDENT_AMBULATORY_CARE_PROVIDER_SITE_OTHER): Payer: Self-pay

## 2023-01-15 DIAGNOSIS — M542 Cervicalgia: Secondary | ICD-10-CM

## 2023-01-15 NOTE — Progress Notes (Signed)
Office Visit Note   Patient: Andrew Sandoval           Date of Birth: Oct 15, 1980           MRN: [id number] Visit Date: 01/15/2023              Requested by: Hoy Register, MD [address] [place],  [place] [address] PCP: Hoy Register, MD   Assessment & Plan: Visit Diagnoses:  1. Neck pain     Plan: Patient is a 42 year old gentleman with left-sided neck pain and trapezius pain impression is cervical radiculopathy.  Dry needling physical therapy has not helped.  Physical therapy for the shoulder has not helped.  At this point I have recommended MRI of the C-spine to rule out structural abnormalities.  After the MRI I will likely make referral to Dr. Alvester Morin for [facility]  Interpreter present today.  Follow-Up Instructions: No follow-ups on file.   Orders:  Orders Placed This Encounter  Procedures   XR Cervical Spine 2 or 3 views   XR Shoulder Left   No orders of the defined types were placed in this encounter.     Procedures: No procedures performed   Clinical Data: No additional findings.   Subjective: Chief Complaint  Patient presents with   Neck - Pain    HPI [name] is a 42 year old gentleman who comes in for left-sided neck and shoulder pain for about a months.  Has pain if he turns his head.  He has had physical therapy for the shoulder and neck.  He has tried dry needling as well.  He denies any numbness and tingling in his hands.  Interpreter present today. Review of Systems  Constitutional: Negative.   HENT: Negative.    Eyes: Negative.   Respiratory: Negative.    Cardiovascular: Negative.   Gastrointestinal: Negative.   Endocrine: Negative.   Genitourinary: Negative.   Skin: Negative.   Allergic/Immunologic: Negative.   Neurological: Negative.   Hematological: Negative.   Psychiatric/Behavioral: Negative.    All other systems reviewed and are negative.    Objective: Vital Signs: There were no vitals taken for this  visit.  Physical Exam Vitals and nursing note reviewed.  Constitutional:      Appearance: He is well-developed.  Pulmonary:     Effort: Pulmonary effort is normal.  Abdominal:     Palpations: Abdomen is soft.  Skin:    General: Skin is warm.  Neurological:     Mental Status: He is alert and oriented to person, place, and time.  Psychiatric:        Behavior: Behavior normal.        Thought Content: Thought content normal.        Judgment: Judgment normal.     Ortho Exam Examination of the left shoulder is normal. Examination of the cervical spine shows pain with rotation and positive Spurling sign. Specialty Comments:  No specialty comments available.  Imaging: XR Cervical Spine 2 or 3 views  Result Date: 01/15/2023 X-rays demonstrate degenerative disc disease from C5-C7.  XR Shoulder Left  Result Date: 01/15/2023 No acute or structural abnormalities    PMFS History: Patient Active Problem List   Diagnosis Date Noted   Bilateral carpal tunnel syndrome 02/02/2020   Chronic pruritic rash in adult 10/21/2019   Pain in both hands 08/18/2019   Acute pain of right shoulder 08/18/2019   Hyperlipidemia 06/09/2017   Enlarged tonsils 11/11/2016   Tinea pedis of right foot 11/11/2016  Numbness 11/11/2016   Positive H. pylori test 07/23/2016   Chronic nasal congestion 01/18/2015   Past Medical History:  Diagnosis Date   Cocaine use 2006-2010   Hyperlipidemia 06/09/2017    Family History  Problem Relation Age of Onset   Diabetes Neg Hx     Past Surgical History:  Procedure Laterality Date   No prior surgery     Social History   Occupational History    Comment: [employer]  Tobacco Use   Smoking status: Former   Smokeless tobacco: Never   Tobacco comments:    quit 12 years ago  Vaping Use   Vaping status: Never Used  Substance and Sexual Activity   Alcohol use: No    Alcohol/week: 0.0 standard drinks of alcohol    Comment: quit 12 years ago   Drug use: Not  Currently    Types: Cocaine, Marijuana    Comment: Prior cocaine; none in 11 years   Sexual activity: Yes

## 2023-01-22 ENCOUNTER — Ambulatory Visit: Payer: Self-pay | Admitting: *Deleted

## 2023-01-22 ENCOUNTER — Other Ambulatory Visit: Payer: Self-pay

## 2023-01-22 NOTE — Telephone Encounter (Signed)
  Chief Complaint: Right arm, chest pain Symptoms: 8/10 pain right arm, rib area, chest "At breast."  Frequency: 2-3 weeks.Fatigued, pain worse with deep breathing, SOB. Pertinent Negatives: Patient denies injury, swelling, redness, warmth Disposition: [x] ED /[] Urgent Care (no appt availability in office) / [] Appointment(In office/virtual)/ []  [facility]/ [] Home Care/ [] Refused Recommended Disposition /[] [facility]/ []  Follow-up with PCP Additional Notes: Advised ED, states will follow disposition.  Assisted by [facility] [phone] Reason for Disposition  [1] Age > 40 AND [2] associated chest or jaw pain AND [3] pain lasts > 5 minutes  Answer Assessment - Initial Assessment Questions 1. ONSET: "When did the pain start?"     2-3 weeks ago 2. LOCATION: "Where is the pain located?"     Right arm, rib cage area, chest  "At breast" 3. PAIN: "How bad is the pain?" (Scale 1-10; or mild, moderate, severe)   - MILD (1-3): Doesn't interfere with normal activities.   - MODERATE (4-7): Interferes with normal activities (e.g., work or school) or awakens from sleep.   - SEVERE (8-10): Excruciating pain, unable to do any normal activities, unable to hold a cup of water.     8/10 4. WORK OR EXERCISE: "Has there been any recent work or exercise that involved this part of the body?"     No 5. CAUSE: "What do you think is causing the arm pain?"     Unsure 6. OTHER SYMPTOMS: "Do you have any other symptoms?" (e.g., neck pain, swelling, rash, fever, numbness, weakness)     Fatigued, SOB  Protocols used: Arm Pain-A-AH

## 2023-01-22 NOTE — Telephone Encounter (Signed)
noted 

## 2023-01-23 ENCOUNTER — Encounter: Payer: Self-pay | Admitting: Family Medicine

## 2023-01-23 ENCOUNTER — Other Ambulatory Visit: Payer: Self-pay

## 2023-01-23 ENCOUNTER — Ambulatory Visit: Payer: Self-pay | Attending: Family Medicine | Admitting: Family Medicine

## 2023-01-23 VITALS — BP 119/77 | HR 56 | Ht 62.0 in | Wt 173.4 lb

## 2023-01-23 DIAGNOSIS — L309 Dermatitis, unspecified: Secondary | ICD-10-CM

## 2023-01-23 DIAGNOSIS — M79601 Pain in right arm: Secondary | ICD-10-CM

## 2023-01-23 DIAGNOSIS — M5412 Radiculopathy, cervical region: Secondary | ICD-10-CM

## 2023-01-23 MED ORDER — MELOXICAM 7.5 MG PO TABS
7.5000 mg | ORAL_TABLET | Freq: Every day | ORAL | 1 refills | Status: DC
Start: 2023-01-23 — End: 2023-02-26
  Filled 2023-01-23: qty 30, 30d supply, fill #0

## 2023-01-23 MED ORDER — METHOCARBAMOL 750 MG PO TABS
750.0000 mg | ORAL_TABLET | Freq: Three times a day (TID) | ORAL | 1 refills | Status: DC | PRN
Start: 2023-01-23 — End: 2023-02-26
  Filled 2023-01-23: qty 60, 20d supply, fill #0

## 2023-01-23 MED ORDER — BETAMETHASONE DIPROPIONATE 0.05 % EX CREA
TOPICAL_CREAM | Freq: Two times a day (BID) | CUTANEOUS | 1 refills | Status: DC
Start: 2023-01-23 — End: 2023-08-20
  Filled 2023-01-23: qty 45, 30d supply, fill #0
  Filled 2023-03-13: qty 45, 30d supply, fill #1

## 2023-01-23 MED ORDER — DULOXETINE HCL 60 MG PO CPEP
60.0000 mg | ORAL_CAPSULE | Freq: Every day | ORAL | 3 refills | Status: DC
Start: 2023-01-23 — End: 2024-03-24
  Filled 2023-01-23: qty 30, 30d supply, fill #0

## 2023-01-23 NOTE — Progress Notes (Signed)
Subjective:  Patient ID: Andrew Sandoval, male    DOB: 08-20-1980  Age: 42 y.o. MRN: [id number]  CC: Arm Pain (Right arm pain for 3 weeks)   HPI Andrew Sandoval is a 42 y.o. year old male with a history of hyperlipidemia, cervical radiculopathy.  Interval History: Discussed the use of AI scribe software for clinical note transcription with the patient, who gave verbal consent to proceed.  He presents with a three-week history of persistent right arm and neck pain. The pain is described as constant, with a tingling sensation, and intensifies when sneezing. The patient denies heavy lifting at work. The patient has been managing the pain with a muscle relaxant, Robaxin, prescribed previously. A recent visit to the emergency department resulted in additional prescriptions for patches and pills for muscle pain and he is unsure of the name of the ED he presented to.  He is right-hand dominant. He was evaluated by orthopedic, Dr. Roda Shutters last week after he had completed a session of physical therapy and per notes he has a diagnosis of cervical radiculopathy.  MRI C-spine was ordered with the plan to refer him to Dr. Alvester Morin for [facility].   The patient also requests refill of the cream that was previously prescribed for rash on his foot.       Past Medical History:  Diagnosis Date   Cocaine use 2006-2010   Hyperlipidemia 06/09/2017    Past Surgical History:  Procedure Laterality Date   No prior surgery      Family History  Problem Relation Age of Onset   Diabetes Neg Hx     Social History   Socioeconomic History   Marital status: Single    Spouse name: Not on file   Number of children: Not on file   Years of education: Not on file   Highest education level: Not on file  Occupational History    Comment: [employer]  Tobacco Use   Smoking status: Former   Smokeless tobacco: Never   Tobacco comments:    quit 12 years ago  Vaping Use   Vaping status: Never Used  Substance and  Sexual Activity   Alcohol use: No    Alcohol/week: 0.0 standard drinks of alcohol    Comment: quit 12 years ago   Drug use: Not Currently    Types: Cocaine, Marijuana    Comment: Prior cocaine; none in 11 years   Sexual activity: Yes  Other Topics Concern   Not on file  Social History Narrative   Not on file   Social Determinants of Health   Financial Resource Strain: Not on file  Food Insecurity: Not on file  Transportation Needs: Not on file  Physical Activity: Not on file  Stress: Not on file  Social Connections: Not on file    Allergies  Allergen Reactions   Tylenol [Acetaminophen] Hives    Outpatient Medications Prior to Visit  Medication Sig Dispense Refill   atorvastatin (LIPITOR) 80 MG tablet Take 1 tablet (80 mg total) by mouth daily. 90 tablet 1   betamethasone dipropionate 0.05 % cream Apply topically 2 (two) times daily. 45 g 1   cetirizine (ZYRTEC ALLERGY) 10 MG tablet Take 1 tablet (10 mg total) by mouth daily. 30 tablet 12   ciprofloxacin-dexamethasone (CIPRODEX) OTIC suspension Place 4 drops into the left ear 2 (two) times daily. 7.5 mL 1   fenofibrate (TRICOR) 145 MG tablet Take 1 tablet (145 mg total) by mouth daily. 90 tablet 1  fluticasone (FLONASE) 50 MCG/ACT nasal spray Place 2 sprays into both nostrils daily. 16 g 1   gabapentin (NEURONTIN) 300 MG capsule Take 1 capsule (300 mg total) by mouth 2 (two) times daily. (Patient not taking: Reported on 01/23/2023) 60 capsule 1   methocarbamol (ROBAXIN-750) 750 MG tablet Take 1 tablet (750 mg total) by mouth 3 (three) times daily as needed for muscle spasms. (Patient not taking: Reported on 01/23/2023) 60 tablet 1   No facility-administered medications prior to visit.     ROS Review of Systems  Constitutional:  Negative for activity change and appetite change.  HENT:  Negative for sinus pressure and sore throat.   Respiratory:  Negative for chest tightness, shortness of breath and wheezing.    Cardiovascular:  Negative for chest pain and palpitations.  Gastrointestinal:  Negative for abdominal distention, abdominal pain and constipation.  Genitourinary: Negative.   Musculoskeletal:        See HPI  Psychiatric/Behavioral:  Negative for behavioral problems and dysphoric mood.     Objective:  BP 119/77   Pulse (!) 56   Ht 5\' 2"  (1.575 m)   Wt 173 lb 6.4 oz (78.7 kg)   SpO2 98%   BMI 31.72 kg/m      01/23/2023   10:27 AM 11/05/2022    9:13 AM 08/29/2022    9:14 AM  BP/Weight  Systolic BP 119 122 122  Diastolic BP 77 80 82  Wt. (Lbs) 173.4 173.6 173  BMI 31.72 kg/m2 31.75 kg/m2 31.64 kg/m2      Physical Exam Constitutional:      Appearance: He is well-developed.  Cardiovascular:     Rate and Rhythm: Bradycardia present.     Heart sounds: Normal heart sounds. No murmur heard. Pulmonary:     Effort: Pulmonary effort is normal.     Breath sounds: Normal breath sounds. No wheezing or rales.  Chest:     Chest wall: No tenderness.  Abdominal:     General: Bowel sounds are normal. There is no distension.     Palpations: Abdomen is soft. There is no mass.     Tenderness: There is no abdominal tenderness.  Musculoskeletal:        General: Normal range of motion.     Cervical back: Tenderness (bilateral) present.     Right lower leg: No edema.     Left lower leg: No edema.     Comments: Forward elevation of right achievable up to 150 degrees Tenderness to deep palpation of right biceps muscle and right forearm muscle   Neurological:     Mental Status: He is alert and oriented to person, place, and time.     Comments: Strength in right hand - 4/5 Strength in left hand - 5/5  Psychiatric:        Mood and Affect: Mood normal.        Latest Ref Rng & Units 11/05/2022   10:20 AM 03/20/2022    3:00 PM 11/27/2021   11:18 AM  CMP  Glucose 70 - 99 mg/dL 97  87  295   BUN 6 - 24 mg/dL 16  10  14    Creatinine 0.76 - 1.27 mg/dL 2.84  1.32  4.40   Sodium mmol/L  CANCELED  142  140   Potassium mmol/L CANCELED  4.1  4.2   Chloride 96 - 106 mmol/L 119  104  103   CO2 mmol/L CANCELED  23  25   Calcium 8.7 - 10.2 mg/dL  9.4  9.1  9.7   Total Protein 6.0 - 8.5 g/dL 7.2   7.7   Total Bilirubin 0.0 - 1.2 mg/dL 0.2   1.4   Alkaline Phos 44 - 121 IU/L 127   119   AST 0 - 40 IU/L 37   34   ALT 0 - 44 IU/L 52   75     Lipid Panel     Component Value Date/Time   CHOL 279 (H) 11/05/2022 1020   TRIG 1,320 (HH) 11/05/2022 1020   HDL 27 (L) 11/05/2022 1020   CHOLHDL 4.5 06/20/2021 1024   LDLCALC Comment (A) 11/05/2022 1020    CBC    Component Value Date/Time   WBC 4.2 03/20/2022 1500   WBC 5.2 07/04/2020 0905   RBC 5.20 03/20/2022 1500   RBC 5.48 07/04/2020 0905   HGB 16.3 03/20/2022 1500   HCT 47.3 03/20/2022 1500   PLT 269 03/20/2022 1500   MCV 91 03/20/2022 1500   MCH 31.3 03/20/2022 1500   MCH 30.8 07/04/2020 0905   MCHC 34.5 03/20/2022 1500   MCHC 33.6 07/04/2020 0905   RDW 12.5 03/20/2022 1500   LYMPHSABS 1.5 03/20/2022 1500   MONOABS 0.4 04/18/2009 1420   EOSABS 0.1 03/20/2022 1500   BASOSABS 0.0 03/20/2022 1500    Lab Results  Component Value Date   HGBA1C 5.5 05/15/2022    Assessment & Plan:      Cervical radiculopathy He previously has left arm pain which has resolved but now has right arm pain Chronic pain with tingling sensation, likely due to nerve compression. Orthopedic consultation ongoing with MRI pending. -Continue Robaxin for muscle relaxation. -Prescribe Cymbalta for neuropathic pain. -Prescribe Meloxicam for inflammation. -Advise patient to follow up with Dr. Warren Danes office regarding MRI.  Foot rash -Refill Betamethasone cream per request.          Meds ordered this encounter  Medications   DULoxetine (CYMBALTA) 60 MG capsule    Sig: Take 1 capsule (60 mg total) by mouth daily. For cervical radiculopathy    Dispense:  30 capsule    Refill:  3   methocarbamol (ROBAXIN-750) 750 MG tablet    Sig: Take 1  tablet (750 mg total) by mouth 3 (three) times daily as needed for muscle spasms.    Dispense:  60 tablet    Refill:  1   meloxicam (MOBIC) 7.5 MG tablet    Sig: Take 1 tablet (7.5 mg total) by mouth daily.    Dispense:  30 tablet    Refill:  1    Follow-up: Return for previously scheduled appointment.       Hoy Register, MD, FAAFP. [facility] [place], [place] [phone]   01/23/2023, 11:37 AM

## 2023-01-23 NOTE — Patient Instructions (Signed)
Cervical Radiculopathy  Cervical radiculopathy happens when a nerve in the neck (a cervical nerve) is pinched or bruised. This condition can happen because of an injury to the cervical spine (vertebrae) in the neck, or as part of the normal aging process. Pressure on the cervical nerves can cause pain or numbness that travels from the neck all the way down to the arm and fingers. This condition usually gets better with rest. Treatment may be needed if the condition does not improve. What are the causes? This condition may be caused by: A neck injury. A bulging (herniated) disk. Muscle spasms. Muscle tightness in the neck due to overuse. Arthritis. Breakdown or degeneration in the bones and joints of the spine (spondylosis) due to aging. Bone spurs that may develop near the cervical nerves. What are the signs or symptoms? Symptoms of this condition include: Pain. The pain may travel from the neck to the arm and hand. The pain can be severe or irritating. It may get worse when you move your neck. Numbness or tingling in your arm or hand. Weakness in the affected arm and hand, in severe cases. How is this diagnosed? This condition may be diagnosed based on your symptoms, your medical history, and a physical exam. You may also have tests, including: X-rays. CT scan. MRI. Electromyogram (EMG). Nerve conduction tests. How is this treated? In many cases, treatment is not needed for this condition. With rest, the condition usually gets better over time. If treatment is needed, options may include: Wearing a soft neck collar (cervical collar) for short periods of time. Doing physical therapy to strengthen your neck muscles. Taking medicines. These may include NSAIDs, such as ibuprofen, or oral corticosteroids. Having spinal injections, in severe cases. Having surgery. This may be needed if other treatments do not help. Different types of surgery may be done depending on the cause of this  condition. Follow these instructions at home: If you have a cervical collar: Wear it as told by your health care provider. Remove it only as told by your health care provider. Ask your health care provider if you can remove the cervical collar for cleaning and bathing. If you are allowed to remove the collar for cleaning or bathing: Follow instructions from your health care provider about how to remove the collar safely. Clean the collar by wiping it with mild soap and water and drying it completely. Take out any removable pads in the collar every 1-2 days, and wash them by hand with soap and water. Let them air-dry completely before you put them back in the collar. Check your skin under the collar for irritation or sores. If you see any, tell your health care provider. Managing pain     Take over-the-counter and prescription medicines only as told by your health care provider. If directed, put ice on the affected area. To do this: If you have a soft neck collar, remove it as told by your health care provider. Put ice in a plastic bag. Place a towel between your skin and the bag. Leave the ice on for 20 minutes, 2-3 times a day. Remove the ice if your skin turns bright red. This is very important. If you cannot feel pain, heat, or cold, you have a greater risk of damage to the area. If applying ice does not help, you can try using heat. Use the heat source that your health care provider recommends, such as a moist heat pack or a heating pad. Place a towel between   your skin and the heat source. Leave the heat on for 20-30 minutes. Remove the heat if your skin turns bright red. This is especially important if you are unable to feel pain, heat, or cold. You have a greater risk of getting burned. Try a gentle neck and shoulder massage to help relieve symptoms. Activity Rest as needed. Return to your normal activities as told by your health care provider. Ask your health care provider what  activities are safe for you. Do stretching and strengthening exercises as told by your health care provider or your physical therapist. You may have to avoid lifting. Ask your health care provider how much you can safely lift. General instructions Use a flat pillow when you sleep. Do not drive while wearing a cervical collar. If you do not have a cervical collar, ask your health care provider if it is safe to drive while your neck heals. Ask your health care provider if the medicine prescribed to you requires you to avoid driving or using machinery. Do not use any products that contain nicotine or tobacco. These products include cigarettes, chewing tobacco, and vaping devices, such as e-cigarettes. If you need help quitting, ask your health care provider. Keep all follow-up visits. This is important. Contact a health care provider if: Your condition does not improve with treatment. Get help right away if: Your pain gets much worse and is not controlled with medicines. You have weakness or numbness in your hand, arm, face, or leg. You have a high fever. You have a stiff, rigid neck. You lose control of your bowels or your bladder (have incontinence). You have trouble with walking, balance, or speaking. Summary Cervical radiculopathy happens when a nerve in the neck is pinched or bruised. A nerve can get pinched from a bulging disk, arthritis, muscle spasms, or an injury to the neck. Symptoms include pain, tingling, or numbness radiating from the neck to the arm or hand. Weakness can also occur in severe cases. Treatment may include rest, wearing a cervical collar, and physical therapy. Medicines may be prescribed to help with pain. In severe cases, injections or surgery may be needed. This information is not intended to replace advice given to you by your health care provider. Make sure you discuss any questions you have with your health care provider. Document Revised: 11/30/2020 Document  Reviewed: 11/30/2020 Elsevier Patient Education  2024 Elsevier Inc.  

## 2023-02-13 ENCOUNTER — Ambulatory Visit: Payer: Self-pay | Admitting: *Deleted

## 2023-02-13 NOTE — Telephone Encounter (Signed)
  Chief Complaint: Dizziness Symptoms: Spinning while lying in bed, worse sitting to standing, right arm tingling. Chills and sweating. Frequency: "Since the increase in Fenofibrate, 4 days after." Pertinent Negatives: Patient denies Disposition: [] ED /[x] Urgent Care (no appt availability in office) / [] Appointment(In office/virtual)/ []  [facility]/ [] Home Care/ [] Refused Recommended Disposition /[] [facility]/ []  Follow-up with PCP Additional Notes: Does not check BP at home. States was not aware of increase in med. No availability at practice, advised UC. Reason for Disposition  [1] MODERATE dizziness (e.g., vertigo; feels very unsteady, interferes with normal activities) AND [2] has NOT been evaluated by doctor (or NP/PA) for this  Answer Assessment - Initial Assessment Questions 1. DESCRIPTION: "Describe your dizziness."     Spinning in bed, floating when up standing 2. VERTIGO: "Do you feel like either you or the room is spinning or tilting?"      At times 3. LIGHTHEADED: "Do you feel lightheaded?" (e.g., somewhat faint, woozy, weak upon standing)     At times 4. SEVERITY: "How bad is it?"  "Can you walk?"   - MILD: Feels slightly dizzy and unsteady, but is walking normally.   - MODERATE: Feels unsteady when walking, but not falling; interferes with normal activities (e.g., school, work).   - SEVERE: Unable to walk without falling, or requires assistance to walk without falling.     Moderate 5. ONSET:  "When did the dizziness begin?"     4th day after taking increase in med, 6. AGGRAVATING FACTORS: "Does anything make it worse?" (e.g., standing, change in head position)     Sitting to standing 7. CAUSE: "What do you think is causing the dizziness?"     Increase in Fenofibrate. 8. RECURRENT SYMPTOM: "Have you had dizziness before?" If Yes, ask: "When was the last time?" "What happened that time?"     No 9. OTHER SYMPTOMS: "Do you have any other symptoms?"  (e.g., headache, weakness, numbness, vomiting, earache)     Right arm hurting, tingling  Protocols used: Dizziness - Vertigo-A-AH

## 2023-02-14 ENCOUNTER — Encounter (HOSPITAL_COMMUNITY): Payer: Self-pay | Admitting: *Deleted

## 2023-02-14 ENCOUNTER — Ambulatory Visit (HOSPITAL_COMMUNITY)
Admission: EM | Admit: 2023-02-14 | Discharge: 2023-02-14 | Disposition: A | Discharge status: Home / Self Care | Payer: Self-pay | Attending: Emergency Medicine | Admitting: Emergency Medicine

## 2023-02-14 ENCOUNTER — Telehealth (HOSPITAL_COMMUNITY): Payer: Self-pay | Admitting: Radiology

## 2023-02-14 ENCOUNTER — Other Ambulatory Visit: Payer: Self-pay

## 2023-02-14 DIAGNOSIS — R252 Cramp and spasm: Secondary | ICD-10-CM

## 2023-02-14 DIAGNOSIS — T50905A Adverse effect of unspecified drugs, medicaments and biological substances, initial encounter: Secondary | ICD-10-CM

## 2023-02-14 DIAGNOSIS — R42 Dizziness and giddiness: Secondary | ICD-10-CM

## 2023-02-14 DIAGNOSIS — M5412 Radiculopathy, cervical region: Secondary | ICD-10-CM

## 2023-02-14 LAB — CBC WITH DIFFERENTIAL/PLATELET
Abs Immature Granulocytes: 0.02 10*3/uL (ref 0.00–0.07)
Basophils Absolute: 0 10*3/uL (ref 0.0–0.1)
Basophils Relative: 1 %
Eosinophils Absolute: 0.1 10*3/uL (ref 0.0–0.5)
Eosinophils Relative: 2 %
HCT: 50.9 % (ref 39.0–52.0)
Hemoglobin: 17.9 g/dL — ABNORMAL HIGH (ref 13.0–17.0)
Immature Granulocytes: 0 %
Lymphocytes Relative: 35 %
Lymphs Abs: 2 10*3/uL (ref 0.7–4.0)
MCH: 31.6 pg (ref 26.0–34.0)
MCHC: 35.2 g/dL (ref 30.0–36.0)
MCV: 89.9 fL (ref 80.0–100.0)
Monocytes Absolute: 0.5 10*3/uL (ref 0.1–1.0)
Monocytes Relative: 8 %
Neutro Abs: 3.1 10*3/uL (ref 1.7–7.7)
Neutrophils Relative %: 54 %
Platelets: 308 10*3/uL (ref 150–400)
RBC: 5.66 MIL/uL (ref 4.22–5.81)
RDW: 12.7 % (ref 11.5–15.5)
WBC: 5.7 10*3/uL (ref 4.0–10.5)
nRBC: 0 % (ref 0.0–0.2)

## 2023-02-14 MED ORDER — DEXAMETHASONE SODIUM PHOSPHATE 10 MG/ML IJ SOLN
INTRAMUSCULAR | Status: AC
  Filled 2023-02-14: qty 1

## 2023-02-14 MED ORDER — DEXAMETHASONE SODIUM PHOSPHATE 10 MG/ML IJ SOLN
10.0000 mg | Freq: Once | INTRAMUSCULAR | Status: AC
  Administered 2023-02-14: 10 mg via INTRAMUSCULAR

## 2023-02-14 NOTE — Discharge Instructions (Addendum)
Por favor, consulte con su mdico de cabecera dentro de la prxima [employer] efectos secundarios de sus medicamentos. Le dimos un medicamento intramuscular para ver si esto le ayuda con su dolor de cuello continuo.  Por favor, tome la mitad de la tableta de atorvastatina para una dosis de 40 mg en lugar de 80 mg, ya que creo que esto est causando sus dolores y [employer].  Robaxin puede causar somnolencia, ha estado tomndolo tres [employer]. Le sugiero que intente tomarlo una vez por la noche para ver si esto le ayuda con sus mareos.  Cymbalta tambin puede estar causando sus mareos, ya que comenz a tomarlo en su ltima visita. Tmelo por la noche para ver si esto le ayuda a minimizar sus mareos.  Hoy hemos realizado anlisis de laboratorio y nos comunicaremos con usted si hay alguna emergencia. Su mdico de cabecera podr ver esta visita y [employer].  Please follow-up with your primary care provider within the next week regarding the side effects of your medications. We gave you an intramuscular medication to see if this will help with your continued neck pain.   Please 1/2 the atorvastatin tablet for a dose of 40 mg instead of the 80 mg as I believe this is causing your muscle aches and pains.   Robaxin may cause drowsiness, you have been taking this three times daily. I suggest trying to take this one time at night to see if this helps with your dizziness.   The Cymbalta also may be causing your dizziness, as you started this at your last visit. Please take this at night to see if this will help minimize your dizziness.   We have drawn labs today and we will contact you if there is anything emergent.  Your primary care provider will be able to see this visit and review the results.

## 2023-02-14 NOTE — Telephone Encounter (Signed)
Noted  

## 2023-02-14 NOTE — ED Triage Notes (Signed)
Pt reports Rt arm pain and feeling dizzy for 4 days. Pt has a dose increased of atorvastatin,duloxetine,meloxicam and methocarbamol. Pt was instructed by PCP office to come to [facility] to see if any of the new meds could cause him to feel dizzy

## 2023-02-14 NOTE — ED Provider Notes (Signed)
[facility]    CSN: [id number] Arrival date & time: 02/14/23  1234      History   Chief Complaint Chief Complaint  Patient presents with   Arm Pain   Dizziness    HPI Andrew Sandoval is a 42 y.o. male.   Patient presents to clinic for right arm pain and dizziness for the past four days Feels like his right arm is cramping. Was taking atorvastatin 40 mg, was increased to 80mg  d/t cholesterol levels.   Has ongoing neck pain, known cervical radiculopathy. Started on Cymbalta for neuropathic pain. Also started on Robaxin, has been taking 3x daily.   Medications were altered 15 days prior to visit today.   Denies abdominal pain, chest pain, syncope, cough, fever or congestion.      The history is provided by the patient and medical records. The history is limited by a language barrier. A language interpreter was used.  Arm Pain Pertinent negatives include no chest pain, no abdominal pain and no shortness of breath.  Dizziness Associated symptoms: no chest pain, no diarrhea, no nausea, no shortness of breath and no vomiting     Past Medical History:  Diagnosis Date   Cocaine use 2006-2010   Hyperlipidemia 06/09/2017    Patient Active Problem List   Diagnosis Date Noted   Bilateral carpal tunnel syndrome 02/02/2020   Chronic pruritic rash in adult 10/21/2019   Pain in both hands 08/18/2019   Acute pain of right shoulder 08/18/2019   Hyperlipidemia 06/09/2017   Enlarged tonsils 11/11/2016   Tinea pedis of right foot 11/11/2016   Numbness 11/11/2016   Positive H. pylori test 07/23/2016   Chronic nasal congestion 01/18/2015    Past Surgical History:  Procedure Laterality Date   No prior surgery         Home Medications    Prior to Admission medications   Medication Sig Start Date End Date Taking? Authorizing Provider  betamethasone dipropionate 0.05 % cream Apply topically 2 (two) times daily. 01/23/23  Yes Hoy Register, MD  DULoxetine  (CYMBALTA) 60 MG capsule Take 1 capsule (60 mg total) by mouth daily. For cervical radiculopathy 01/23/23  Yes Hoy Register, MD  meloxicam (MOBIC) 7.5 MG tablet Take 1 tablet (7.5 mg total) by mouth daily. 01/23/23  Yes Hoy Register, MD  methocarbamol (ROBAXIN-750) 750 MG tablet Take 1 tablet (750 mg total) by mouth 3 (three) times daily as needed for muscle spasms. 01/23/23  Yes Hoy Register, MD  atorvastatin (LIPITOR) 80 MG tablet Take 1 tablet (80 mg total) by mouth daily. 11/06/22   Hoy Register, MD  fenofibrate (TRICOR) 145 MG tablet Take 1 tablet (145 mg total) by mouth daily. 11/06/22   Hoy Register, MD    Family History Family History  Problem Relation Age of Onset   Diabetes Neg Hx     Social History Social History   Tobacco Use   Smoking status: Former   Smokeless tobacco: Never   Tobacco comments:    quit 12 years ago  Vaping Use   Vaping status: Never Used  Substance Use Topics   Alcohol use: No    Alcohol/week: 0.0 standard drinks of alcohol    Comment: quit 12 years ago   Drug use: Not Currently    Types: Cocaine, Marijuana    Comment: Prior cocaine; none in 11 years     Allergies   Tylenol [acetaminophen]   Review of Systems Review of Systems  HENT:  Negative for sore  throat.   Respiratory:  Negative for cough and shortness of breath.   Cardiovascular:  Negative for chest pain.  Gastrointestinal:  Negative for abdominal pain, diarrhea, nausea and vomiting.  Musculoskeletal:  Positive for myalgias.  Neurological:  Positive for dizziness.     Physical Exam Triage Vital Signs ED Triage Vitals  Encounter Vitals Group     BP 02/14/23 1426 (!) 136/90     Systolic BP Percentile --      Diastolic BP Percentile --      Pulse Rate 02/14/23 1426 66     Resp 02/14/23 1426 18     Temp 02/14/23 1426 98.2 F (36.8 C)     Temp src --      SpO2 02/14/23 1426 97 %     Weight --      Height --      Head Circumference --      Peak Flow --       Pain Score 02/14/23 1419 0     Pain Loc --      Pain Education --      Exclude from Growth Chart --    No data found.  Updated Vital Signs BP (!) 136/90   Pulse 66   Temp 98.2 F (36.8 C)   Resp 18   SpO2 97%   Visual Acuity Right Eye Distance:   Left Eye Distance:   Bilateral Distance:    Right Eye Near:   Left Eye Near:    Bilateral Near:     Physical Exam Vitals and nursing note reviewed.  Constitutional:      Appearance: Normal appearance.  HENT:     Head: Normocephalic and atraumatic.     Right Ear: External ear normal.     Left Ear: External ear normal.     Nose: Nose normal.     Mouth/Throat:     Mouth: Mucous membranes are moist.  Eyes:     Conjunctiva/sclera: Conjunctivae normal.  Cardiovascular:     Rate and Rhythm: Normal rate and regular rhythm.     Heart sounds: Normal heart sounds. No murmur heard. Pulmonary:     Effort: Pulmonary effort is normal. No respiratory distress.     Breath sounds: Normal breath sounds.  Musculoskeletal:        General: No swelling or deformity. Normal range of motion.  Skin:    General: Skin is warm and dry.  Neurological:     General: No focal deficit present.     Mental Status: He is alert and oriented to person, place, and time.  Psychiatric:        Mood and Affect: Mood normal.        Behavior: Behavior normal.      UC Treatments / Results  Labs (all labs ordered are listed, but only abnormal results are displayed) Labs Reviewed  COMPREHENSIVE METABOLIC PANEL  CBC WITH DIFFERENTIAL/PLATELET  CK    EKG   Radiology No results found.  Procedures Procedures (including critical care time)  Medications Ordered in UC Medications  dexamethasone (DECADRON) injection 10 mg (has no administration in time range)    Initial Impression / Assessment and Plan / UC Course  I have reviewed the triage vital signs and the nursing notes.  Pertinent labs & imaging results that were available during my care of  the patient were reviewed by me and considered in my medical decision making (see chart for details).  Vitals and triage reviewed, patient is hemodynamically stable.  Suspect muscle aches and pains due to recent increase in atorvastatin, advised to half the dose and follow-up with PCP for high cholesterol.  Dizziness could be from Robaxin, this was a continuation from his previous visit, so less likely.  Dizziness can be coming from Cymbalta.  Advised to take this at night to see if this helps.  Basic lab work obtained today to ensure no hepatic injury, metabolic or electrolyte abnormalities.  Cervical radiculopathy continues, will trial one-time intramuscular steroid injection for pain and inflammation.  Encouraged to follow-up with PCP within the next week.  Plan of care, follow-up care and return precautions given, no questions at this time.     Final Clinical Impressions(s) / UC Diagnoses   Final diagnoses:  Muscle cramping  Medication side effect, initial encounter  Dizziness  Cervical radiculopathy     Discharge Instructions      Por favor, consulte con su mdico de cabecera dentro de la prxima [employer] efectos secundarios de sus medicamentos. Le dimos un medicamento intramuscular para ver si esto le ayuda con su dolor de cuello continuo.  Por favor, tome la mitad de la tableta de atorvastatina para una dosis de 40 mg en lugar de 80 mg, ya que creo que esto est causando sus dolores y [employer].  Robaxin puede causar somnolencia, ha estado tomndolo tres [employer]. Le sugiero que intente tomarlo una vez por la noche para ver si esto le ayuda con sus mareos.  Cymbalta tambin puede estar causando sus mareos, ya que comenz a tomarlo en su ltima visita. Tmelo por la noche para ver si esto le ayuda a minimizar sus mareos.  Hoy hemos realizado anlisis de laboratorio y nos comunicaremos con usted si hay alguna emergencia. Su mdico de cabecera podr ver esta  visita y [employer].  Please follow-up with your primary care provider within the next week regarding the side effects of your medications. We gave you an intramuscular medication to see if this will help with your continued neck pain.   Please 1/2 the atorvastatin tablet for a dose of 40 mg instead of the 80 mg as I believe this is causing your muscle aches and pains.   Robaxin may cause drowsiness, you have been taking this three times daily. I suggest trying to take this one time at night to see if this helps with your dizziness.   The Cymbalta also may be causing your dizziness, as you started this at your last visit. Please take this at night to see if this will help minimize your dizziness.   We have drawn labs today and we will contact you if there is anything emergent.  Your primary care provider will be able to see this visit and review the results.          ED Prescriptions   None    PDMP not reviewed this encounter.   Jamilee Lafosse, [place], [place] 02/14/23 1515

## 2023-02-14 NOTE — Telephone Encounter (Signed)
  Spoke to pt's about coming back in on 9/7 for a redraw since their CK and CMP hemolyzed. Pt agreed and said they would be there in the morning

## 2023-02-14 NOTE — Telephone Encounter (Signed)
Spoke with patient. Patient voiced that he was still having dizziness, right arm tingling chill and sweating. Voices it a little better but not much. Voiced that he is not taking Lipitor 80 mg because he feels it makes him sick. Asked if he feels better when he is not taking Lipitor , patient voiced it about the same. Advised patient  that he should got to the nearest UC to be seen. Patient said that he will go.

## 2023-02-15 ENCOUNTER — Telehealth (HOSPITAL_COMMUNITY): Payer: Self-pay | Admitting: Urgent Care

## 2023-02-15 ENCOUNTER — Encounter (HOSPITAL_COMMUNITY): Payer: Self-pay

## 2023-02-15 ENCOUNTER — Ambulatory Visit (HOSPITAL_COMMUNITY)
Admission: EM | Admit: 2023-02-15 | Discharge: 2023-02-15 | Disposition: A | Discharge status: Home / Self Care | Payer: Self-pay | Attending: Urgent Care | Admitting: Urgent Care

## 2023-02-15 DIAGNOSIS — D582 Other hemoglobinopathies: Secondary | ICD-10-CM | POA: Insufficient documentation

## 2023-02-15 DIAGNOSIS — R252 Cramp and spasm: Secondary | ICD-10-CM | POA: Insufficient documentation

## 2023-02-15 DIAGNOSIS — M5412 Radiculopathy, cervical region: Secondary | ICD-10-CM | POA: Insufficient documentation

## 2023-02-15 LAB — COMPREHENSIVE METABOLIC PANEL
ALT: 44 U/L (ref 0–44)
AST: 35 U/L (ref 15–41)
Albumin: 4 g/dL (ref 3.5–5.0)
Alkaline Phosphatase: 91 U/L (ref 38–126)
Anion gap: 12 (ref 5–15)
BUN: 20 mg/dL (ref 6–20)
CO2: 21 mmol/L — ABNORMAL LOW (ref 22–32)
Calcium: 9.5 mg/dL (ref 8.9–10.3)
Chloride: 102 mmol/L (ref 98–111)
Creatinine, Ser: 0.94 mg/dL (ref 0.61–1.24)
GFR, Estimated: >60 mL/min (ref >60)
Glucose, Bld: 93 mg/dL (ref 70–99)
Potassium: 4.7 mmol/L (ref 3.5–5.1)
Sodium: 135 mmol/L (ref 135–145)
Total Bilirubin: 1.5 mg/dL — ABNORMAL HIGH (ref 0.3–1.2)
Total Protein: 7.3 g/dL (ref 6.5–8.1)

## 2023-02-15 LAB — CBC WITH DIFFERENTIAL/PLATELET
Abs Immature Granulocytes: 0.04 10*3/uL (ref 0.00–0.07)
Basophils Absolute: 0 10*3/uL (ref 0.0–0.1)
Basophils Relative: 0 %
Eosinophils Absolute: 0 10*3/uL (ref 0.0–0.5)
Eosinophils Relative: 0 %
HCT: 48.7 % (ref 39.0–52.0)
Hemoglobin: 17.2 g/dL — ABNORMAL HIGH (ref 13.0–17.0)
Immature Granulocytes: 0 %
Lymphocytes Relative: 13 %
Lymphs Abs: 1.5 10*3/uL (ref 0.7–4.0)
MCH: 32.2 pg (ref 26.0–34.0)
MCHC: 35.3 g/dL (ref 30.0–36.0)
MCV: 91.2 fL (ref 80.0–100.0)
Monocytes Absolute: 0.8 10*3/uL (ref 0.1–1.0)
Monocytes Relative: 7 %
Neutro Abs: 9.3 10*3/uL — ABNORMAL HIGH (ref 1.7–7.7)
Neutrophils Relative %: 80 %
Platelets: 332 10*3/uL (ref 150–400)
RBC: 5.34 MIL/uL (ref 4.22–5.81)
RDW: 13 % (ref 11.5–15.5)
WBC: 11.6 10*3/uL — ABNORMAL HIGH (ref 4.0–10.5)
nRBC: 0 % (ref 0.0–0.2)

## 2023-02-15 LAB — CK: Total CK: 270 U/L (ref 49–397)

## 2023-02-15 NOTE — ED Provider Notes (Signed)
[facility]    CSN: [id number] Arrival date & time: 02/15/23  1002      History   Chief Complaint Chief Complaint  Patient presents with   Follow-up    HPI Andrew Sandoval is a 42 y.o. male.   Pleasant 42 year old male presents today due to concerns of repeat blood work.  He was seen yesterday, in which case the CMP and CK were unable to be processed in the lab.  CBC resulted showing hemoglobin level 17.9.  He states he does not smoke cigarettes.  He does not use testosterone replacement therapy.  He denies symptoms of polycythemia vera.  Did have a history of elevated hemoglobin 11 years ago, normal since then.  He was seen yesterday due to arm pain and dizziness.  It was deemed to be a medication side effect and he was told to stop taking his Lipitor.  States since yesterday he has not had any change to his symptoms, but also denies any new symptoms.  He is being followed by his primary care physician for neck pain and right arm radicular symptoms.  He has an upcoming CT scan to further evaluate his cervical spine.  No additional concerns or complaints     Past Medical History:  Diagnosis Date   Cocaine use 2006-2010   Hyperlipidemia 06/09/2017    Patient Active Problem List   Diagnosis Date Noted   Bilateral carpal tunnel syndrome 02/02/2020   Chronic pruritic rash in adult 10/21/2019   Pain in both hands 08/18/2019   Acute pain of right shoulder 08/18/2019   Hyperlipidemia 06/09/2017   Enlarged tonsils 11/11/2016   Tinea pedis of right foot 11/11/2016   Numbness 11/11/2016   Positive H. pylori test 07/23/2016   Chronic nasal congestion 01/18/2015    Past Surgical History:  Procedure Laterality Date   No prior surgery         Home Medications    Prior to Admission medications   Medication Sig Start Date End Date Taking? Authorizing Provider  atorvastatin (LIPITOR) 80 MG tablet Take 1 tablet (80 mg total) by mouth daily. 11/06/22   Hoy Register, MD  betamethasone dipropionate 0.05 % cream Apply topically 2 (two) times daily. 01/23/23   Hoy Register, MD  DULoxetine (CYMBALTA) 60 MG capsule Take 1 capsule (60 mg total) by mouth daily. For cervical radiculopathy 01/23/23   Hoy Register, MD  fenofibrate (TRICOR) 145 MG tablet Take 1 tablet (145 mg total) by mouth daily. 11/06/22   Hoy Register, MD  meloxicam (MOBIC) 7.5 MG tablet Take 1 tablet (7.5 mg total) by mouth daily. 01/23/23   Hoy Register, MD  methocarbamol (ROBAXIN-750) 750 MG tablet Take 1 tablet (750 mg total) by mouth 3 (three) times daily as needed for muscle spasms. 01/23/23   Hoy Register, MD    Family History Family History  Problem Relation Age of Onset   Diabetes Neg Hx     Social History Social History   Tobacco Use   Smoking status: Former   Smokeless tobacco: Never   Tobacco comments:    quit 12 years ago  Vaping Use   Vaping status: Never Used  Substance Use Topics   Alcohol use: No    Alcohol/week: 0.0 standard drinks of alcohol    Comment: quit 12 years ago   Drug use: Not Currently    Types: Cocaine, Marijuana    Comment: Prior cocaine; none in 11 years     Allergies   Tylenol [acetaminophen]  Review of Systems Review of Systems As per HPI  Physical Exam Triage Vital Signs ED Triage Vitals [02/15/23 1012]  Encounter Vitals Group     BP (!) 158/82     Systolic BP Percentile      Diastolic BP Percentile      Pulse Rate 90     Resp 16     Temp 98.2 F (36.8 C)     Temp Source Oral     SpO2 96 %     Weight      Height      Head Circumference      Peak Flow      Pain Score      Pain Loc      Pain Education      Exclude from Growth Chart    No data found.  Updated Vital Signs BP (!) 158/82 (BP Location: Left Arm)   Pulse 90   Temp 98.2 F (36.8 C) (Oral)   Resp 16   SpO2 96%   Visual Acuity Right Eye Distance:   Left Eye Distance:   Bilateral Distance:    Right Eye Near:   Left Eye Near:     Bilateral Near:     Physical Exam Vitals and nursing note reviewed.  Constitutional:      General: He is not in acute distress.    Appearance: Normal appearance. He is normal weight. He is not ill-appearing or toxic-appearing.  HENT:     Head: Normocephalic and atraumatic.  Cardiovascular:     Rate and Rhythm: Normal rate.  Pulmonary:     Effort: Pulmonary effort is normal. No respiratory distress.  Skin:    General: Skin is warm.     Findings: No erythema or rash.  Neurological:     Mental Status: He is alert and oriented to person, place, and time.      UC Treatments / Results  Labs (all labs ordered are listed, but only abnormal results are displayed) Labs Reviewed  CBC WITH DIFFERENTIAL/PLATELET  COMPREHENSIVE METABOLIC PANEL  CK    EKG   Radiology No results found.  Procedures Procedures (including critical care time)  Medications Ordered in UC Medications - No data to display  Initial Impression / Assessment and Plan / UC Course  I have reviewed the triage vital signs and the nursing notes.  Pertinent labs & imaging results that were available during my care of the patient were reviewed by me and considered in my medical decision making (see chart for details).     Elevated Hgb -CBC yesterday showed a hemoglobin level of 17.9.  Denies cigarette smoking, testosterone replacement therapy.  OSA not documented in his chart however enlarged tonsils are.  Will recheck today to ensure improvement noted. Right cervical radiculopathy. -Patient is under the care of his PCP, has a CT scan pending in the near future to further assess his neck pain and radicular symptoms. Muscle cramping -CMP and CK level were ordered yesterday, however the labs coagulated and were unable to be run.  He is here today primarily to recheck these labs.  He did stop his statin, but states no improvement to his symptoms.  However this was just yesterday. No new complaints today.   Final  Clinical Impressions(s) / UC Diagnoses   Final diagnoses:  Abnormal hemoglobin (HCC)  Right cervical radiculopathy  Muscle cramping     Discharge Instructions      You are here today primarily for lab recheck. Please keep  your appointment with your PCP for further evaluation of your neck We will call with the results of your labs today once available.      ED Prescriptions   None    PDMP not reviewed this encounter.   Maretta Bees, [place] 02/15/23 1116

## 2023-02-15 NOTE — Discharge Instructions (Signed)
You are here today primarily for lab recheck. Please keep your appointment with your PCP for further evaluation of your neck We will call with the results of your labs today once available.

## 2023-02-15 NOTE — Telephone Encounter (Signed)
Called pt at 5:25pm to discuss lab results. Two identifiers used to verify pt identity. No concerning acute findings, but will need to keep his appointment on 9/18 with his PCP. He has stopped taking all of his medications due to muscle cramping and dizziness - states he is starting to feel better, has not taken anything in 7 days. Has CT scan of neck scheduled in near future. All questions/ concerns addressed.

## 2023-02-26 ENCOUNTER — Encounter: Payer: Self-pay | Admitting: Family Medicine

## 2023-02-26 ENCOUNTER — Ambulatory Visit: Payer: Self-pay | Attending: Family Medicine | Admitting: Family Medicine

## 2023-02-26 ENCOUNTER — Other Ambulatory Visit: Payer: Self-pay

## 2023-02-26 ENCOUNTER — Telehealth: Payer: Self-pay | Admitting: Orthopaedic Surgery

## 2023-02-26 VITALS — BP 125/87 | HR 62 | Ht 62.0 in | Wt 173.6 lb

## 2023-02-26 DIAGNOSIS — Z23 Encounter for immunization: Secondary | ICD-10-CM

## 2023-02-26 DIAGNOSIS — M79601 Pain in right arm: Secondary | ICD-10-CM

## 2023-02-26 DIAGNOSIS — E7800 Pure hypercholesterolemia, unspecified: Secondary | ICD-10-CM

## 2023-02-26 DIAGNOSIS — M5412 Radiculopathy, cervical region: Secondary | ICD-10-CM

## 2023-02-26 DIAGNOSIS — E781 Pure hyperglyceridemia: Secondary | ICD-10-CM

## 2023-02-26 MED ORDER — METHOCARBAMOL 750 MG PO TABS
750.0000 mg | ORAL_TABLET | Freq: Three times a day (TID) | ORAL | 1 refills | Status: DC | PRN
  Filled 2023-02-26: qty 60, 20d supply, fill #0

## 2023-02-26 MED ORDER — MELOXICAM 7.5 MG PO TABS
7.5000 mg | ORAL_TABLET | Freq: Every day | ORAL | 1 refills | Status: DC
  Filled 2023-02-26: qty 30, 30d supply, fill #0

## 2023-02-26 NOTE — Telephone Encounter (Signed)
Pt came in stating he has not heard from anyone for his CT scan and would like the contact info so he can set an appt

## 2023-02-26 NOTE — Progress Notes (Signed)
Subjective:  Patient ID: Andrew Sandoval, male    DOB: [birth year]  Age: 42 y.o. MRN: [id number]  CC: Medical Management of Chronic Issues (Neck pain/Headaches/New dosage of Lipitor making him dizzy.)   HPI Josehua Litalien is a 42 y.o. year old male with a history of hyperlipidemia, cervical radiculopathy.   Interval History: Discussed the use of AI scribe software for clinical note transcription with the patient, who gave verbal consent to proceed.  He presents for a follow-up visit. He reports discontinuing atorvastatin 80mg  due to dizziness after only five days of use. He was previously on atorvastatin 40mg , but the dose was increased due to uncontrolled cholesterol levels. He also reports not taking prescribed fenofibrate due to concerns of dizziness. He was advised by a nurse in the emergency department that the medication could cause dizziness.  In addition to hyperlipidemia, the patient has been experiencing persistent neck pain on the left side from his cervical radiculopathy. He reports a significant headache on the left side. He has not followed up with the recommended orthopedic specialist for further evaluation of his neck pain. He has pain medication at home, which he also stopped taking because he said it caused dizziness. He saw Dr. Roda Shutters last month and has been referred to PT.  I had advised him to follow-up with his orthopedic at his last visit.       Past Medical History:  Diagnosis Date   Cocaine use 2006-2010   Hyperlipidemia 06/09/2017    Past Surgical History:  Procedure Laterality Date   No prior surgery      Family History  Problem Relation Age of Onset   Diabetes Neg Hx     Social History   Socioeconomic History   Marital status: Single    Spouse name: Not on file   Number of children: Not on file   Years of education: Not on file   Highest education level: Not on file  Occupational History    Comment: [employer]  Tobacco Use   Smoking  status: Former   Smokeless tobacco: Never   Tobacco comments:    quit 12 years ago  Vaping Use   Vaping status: Never Used  Substance and Sexual Activity   Alcohol use: No    Alcohol/week: 0.0 standard drinks of alcohol    Comment: quit 12 years ago   Drug use: Not Currently    Types: Cocaine, Marijuana    Comment: Prior cocaine; none in 11 years   Sexual activity: Yes  Other Topics Concern   Not on file  Social History Narrative   Not on file   Social Determinants of Health   Financial Resource Strain: Not on file  Food Insecurity: Not on file  Transportation Needs: Not on file  Physical Activity: Not on file  Stress: Not on file  Social Connections: Not on file    Allergies  Allergen Reactions   Tylenol [Acetaminophen] Hives    Outpatient Medications Prior to Visit  Medication Sig Dispense Refill   atorvastatin (LIPITOR) 80 MG tablet Take 1 tablet (80 mg total) by mouth daily. 90 tablet 1   betamethasone dipropionate 0.05 % cream Apply topically 2 (two) times daily. 45 g 1   DULoxetine (CYMBALTA) 60 MG capsule Take 1 capsule (60 mg total) by mouth daily. For cervical radiculopathy 30 capsule 3   fenofibrate (TRICOR) 145 MG tablet Take 1 tablet (145 mg total) by mouth daily. 90 tablet 1   meloxicam (MOBIC) 7.5 MG tablet  Take 1 tablet (7.5 mg total) by mouth daily. 30 tablet 1   methocarbamol (ROBAXIN-750) 750 MG tablet Take 1 tablet (750 mg total) by mouth 3 (three) times daily as needed for muscle spasms. 60 tablet 1   No facility-administered medications prior to visit.     ROS Review of Systems  Constitutional:  Negative for activity change and appetite change.  HENT:  Negative for sinus pressure and sore throat.   Respiratory:  Negative for chest tightness, shortness of breath and wheezing.   Cardiovascular:  Negative for chest pain and palpitations.  Gastrointestinal:  Negative for abdominal distention, abdominal pain and constipation.  Genitourinary:  Negative.   Musculoskeletal:  Positive for neck pain.  Psychiatric/Behavioral:  Negative for behavioral problems and dysphoric mood.     Objective:  BP 125/87   Pulse 62   Ht 5\' 2"  (1.575 m)   Wt 173 lb 9.6 oz (78.7 kg)   SpO2 97%   BMI 31.75 kg/m      02/26/2023    9:04 AM 02/15/2023   10:12 AM 02/14/2023    2:26 PM  BP/Weight  Systolic BP 125 158 136  Diastolic BP 87 82 90  Wt. (Lbs) 173.6    BMI 31.75 kg/m2        Physical Exam Constitutional:      Appearance: He is well-developed.  Cardiovascular:     Rate and Rhythm: Normal rate.     Heart sounds: Normal heart sounds. No murmur heard. Pulmonary:     Effort: Pulmonary effort is normal.     Breath sounds: Normal breath sounds. No wheezing or rales.  Chest:     Chest wall: No tenderness.  Abdominal:     General: Bowel sounds are normal. There is no distension.     Palpations: Abdomen is soft. There is no mass.     Tenderness: There is no abdominal tenderness.  Musculoskeletal:        General: Normal range of motion.     Right lower leg: No edema.     Left lower leg: No edema.  Neurological:     Mental Status: He is alert and oriented to person, place, and time.  Psychiatric:        Mood and Affect: Mood normal.        Latest Ref Rng & Units 02/15/2023   10:25 AM 11/05/2022   10:20 AM 03/20/2022    3:00 PM  CMP  Glucose 70 - 99 mg/dL 93  97  87   BUN 6 - 20 mg/dL 20  16  10    Creatinine 0.61 - 1.24 mg/dL 1.61  0.96  0.45   Sodium 135 - 145 mmol/L 135  CANCELED  142   Potassium 3.5 - 5.1 mmol/L 4.7  CANCELED  4.1   Chloride 98 - 111 mmol/L 102  119  104   CO2 22 - 32 mmol/L 21  CANCELED  23   Calcium 8.9 - 10.3 mg/dL 9.5  9.4  9.1   Total Protein 6.5 - 8.1 g/dL 7.3  7.2    Total Bilirubin 0.3 - 1.2 mg/dL 1.5  0.2    Alkaline Phos 38 - 126 U/L 91  127    AST 15 - 41 U/L 35  37    ALT 0 - 44 U/L 44  52      Lipid Panel     Component Value Date/Time   CHOL 279 (H) 11/05/2022 1020   TRIG 1,320 (HH)  11/05/2022  1020   HDL 27 (L) 11/05/2022 1020   CHOLHDL 4.5 06/20/2021 1024   LDLCALC Comment (A) 11/05/2022 1020    CBC    Component Value Date/Time   WBC 11.6 (H) 02/15/2023 1025   RBC 5.34 02/15/2023 1025   HGB 17.2 (H) 02/15/2023 1025   HGB 16.3 03/20/2022 1500   HCT 48.7 02/15/2023 1025   HCT 47.3 03/20/2022 1500   PLT 332 02/15/2023 1025   PLT 269 03/20/2022 1500   MCV 91.2 02/15/2023 1025   MCV 91 03/20/2022 1500   MCH 32.2 02/15/2023 1025   MCHC 35.3 02/15/2023 1025   RDW 13.0 02/15/2023 1025   RDW 12.5 03/20/2022 1500   LYMPHSABS 1.5 02/15/2023 1025   LYMPHSABS 1.5 03/20/2022 1500   MONOABS 0.8 02/15/2023 1025   EOSABS 0.0 02/15/2023 1025   EOSABS 0.1 03/20/2022 1500   BASOSABS 0.0 02/15/2023 1025   BASOSABS 0.0 03/20/2022 1500    Lab Results  Component Value Date   HGBA1C 5.5 05/15/2022    Assessment & Plan:      Hyperlipidemia/hypertriglyceridemia Patient reported dizziness after starting Atorvastatin 80mg , which was increased from 40mg  due to uncontrolled cholesterol levels. Patient also reported not taking Fenofibrate due to fear of dizziness. -Order cholesterol panel today. -If cholesterol levels remain high, consider changing lovastatin to Crestor -He just picked up a big bottle of atorvastatin and is unsure if he would like to switch to Crestor.  He is willing to retry atorvastatin. -Advise patient to take medications at different times to identify which may be causing dizziness.  Cervical radiculopathy Patient reported ongoing neck pain. Previously seen by orthopedics last month and I have advised him to call Dr. Warren Danes office.  We have provided him with the number to do so. -Advise patient to follow up with specialist for management of neck pain.  Medication Compliance Patient reported inconsistent medication use due to fear of side effects. -Encourage patient to use a systematic approach to identify which medication may be causing  dizziness. -Emphasize importance of consistent medication use for management of hyperlipidemia and neck pain.          Meds ordered this encounter  Medications   meloxicam (MOBIC) 7.5 MG tablet    Sig: Take 1 tablet (7.5 mg total) by mouth daily.    Dispense:  30 tablet    Refill:  1   methocarbamol (ROBAXIN-750) 750 MG tablet    Sig: Take 1 tablet (750 mg total) by mouth 3 (three) times daily as needed for muscle spasms.    Dispense:  60 tablet    Refill:  1    Follow-up: Return in about 6 months (around 08/26/2023).       Hoy Register, MD, FAAFP. [facility] [place], [place] [phone]   02/26/2023, 9:56 AM

## 2023-02-26 NOTE — Patient Instructions (Signed)
[address], [place], [place] [address] Hours:  Open ? Closes 5?PM Phone: [phone]

## 2023-02-27 LAB — LP+NON-HDL CHOLESTEROL
Cholesterol, Total: 267 mg/dL — ABNORMAL HIGH (ref 100–199)
HDL: 31 mg/dL — ABNORMAL LOW (ref >39)
LDL Chol Calc (NIH): 152 mg/dL — ABNORMAL HIGH (ref 0–99)
Total Non-HDL-Chol (LDL+VLDL): 236 mg/dL — ABNORMAL HIGH (ref 0–129)
Triglycerides: 440 mg/dL — ABNORMAL HIGH (ref 0–149)
VLDL Cholesterol Cal: 84 mg/dL — ABNORMAL HIGH (ref 5–40)

## 2023-03-03 NOTE — Telephone Encounter (Signed)
Sent message to [place] and AP with [facility] scheduling

## 2023-03-04 ENCOUNTER — Ambulatory Visit (HOSPITAL_COMMUNITY)
Admission: RE | Admit: 2023-03-04 | Discharge: 2023-03-04 | Disposition: A | Discharge status: Home / Self Care | Payer: Self-pay | Source: Ambulatory Visit | Attending: Orthopaedic Surgery | Admitting: Orthopaedic Surgery

## 2023-03-04 DIAGNOSIS — M542 Cervicalgia: Secondary | ICD-10-CM

## 2023-03-11 ENCOUNTER — Ambulatory Visit (INDEPENDENT_AMBULATORY_CARE_PROVIDER_SITE_OTHER): Payer: Self-pay | Admitting: Physician Assistant

## 2023-03-11 ENCOUNTER — Other Ambulatory Visit: Payer: Self-pay

## 2023-03-11 DIAGNOSIS — M542 Cervicalgia: Secondary | ICD-10-CM

## 2023-03-11 MED ORDER — PREDNISONE 10 MG (21) PO TBPK
ORAL_TABLET | ORAL | 0 refills | Status: DC
  Filled 2023-03-11: qty 21, 6d supply, fill #0

## 2023-03-11 MED ORDER — TRAMADOL HCL 50 MG PO TABS
50.0000 mg | ORAL_TABLET | Freq: Two times a day (BID) | ORAL | 2 refills | Status: DC | PRN
  Filled 2023-03-11: qty 30, 15d supply, fill #0

## 2023-03-11 NOTE — Progress Notes (Signed)
Office Visit Note   Patient: Andrew Sandoval           Date of Birth: 29-Sep-1980           MRN: [id number] Visit Date: 03/11/2023              Requested by: Hoy Register, MD [address] [place] [address] [place],  [place] [address] PCP: Hoy Register, MD   Assessment & Plan: Visit Diagnoses:  1. 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  At this point, would like to make referral to Dr. Alvester Morin for [facility].  He will follow-up with Dr. Christell Constant should his symptoms persist.  Follow-Up Instructions: Return for f/u with Dr. Alvester Morin.   Orders:  Orders Placed This Encounter  Procedures   Ambulatory referral to Physical Medicine Rehab   Meds ordered this encounter  Medications   predniSONE (STERAPRED UNI-PAK 21 TAB) 10 MG (21) TBPK tablet    Sig: Take as directed    Dispense:  21 tablet    Refill:  0   traMADol (ULTRAM) 50 MG tablet    Sig: Take 1 tablet (50 mg total) by mouth every 12 (twelve) hours as needed.    Dispense:  30 tablet    Refill:  2      Procedures: No procedures performed   Clinical Data: No additional findings.   Subjective: Chief Complaint  Patient presents with   Other     Review MRI    HPI patient is a pleasant 42 year old Spanish-speaking gentleman who is here today with an interpreter.  He is here today to discuss MRI results of the cervical spine.  MRI of the cervical spine from 03/04/2023 shows mild to moderate spinal stenosis C3-4 through C7-T1.  He also has multilevel foraminal narrowing with moderate right C3 foraminal stenosis and severe left C4 and C5 foraminal narrowing, moderate C7 foraminal stenosis and moderate C8 foraminal narrowing.  Patient has previously undergone physical therapy as well as taking prescription medications without relief.  No previous ESI.  Review of Systems as detailed in HPI.  All others reviewed and are negative.   Objective: Vital Signs: There were no vitals  taken for this visit.  Physical Exam well-developed well-nourished gentleman in no acute distress.  Alert and oriented x 3.  Ortho Exam unchanged cervical spine exam  Specialty Comments:  No specialty comments available.  Imaging: No new imaging   PMFS History: Patient Active Problem List   Diagnosis Date Noted   Bilateral carpal tunnel syndrome 02/02/2020   Chronic pruritic rash in adult 10/21/2019   Pain in both hands 08/18/2019   Acute pain of right shoulder 08/18/2019   Hyperlipidemia 06/09/2017   Enlarged tonsils 11/11/2016   Tinea pedis of right foot 11/11/2016   Numbness 11/11/2016   Positive H. pylori test 07/23/2016   Chronic nasal congestion 01/18/2015   Past Medical History:  Diagnosis Date   Cocaine use 2006-2010   Hyperlipidemia 06/09/2017    Family History  Problem Relation Age of Onset   Diabetes Neg Hx     Past Surgical History:  Procedure Laterality Date   No prior surgery     Social History   Occupational History    Comment: [employer]  Tobacco Use   Smoking status: Former   Smokeless tobacco: Never   Tobacco comments:    quit 12 years ago  Vaping Use   Vaping status: Never Used  Substance and Sexual  Activity   Alcohol use: No    Alcohol/week: 0.0 standard drinks of alcohol    Comment: quit 12 years ago   Drug use: Not Currently    Types: Cocaine, Marijuana    Comment: Prior cocaine; none in 11 years   Sexual activity: Yes

## 2023-03-12 ENCOUNTER — Other Ambulatory Visit: Payer: Self-pay

## 2023-03-13 ENCOUNTER — Other Ambulatory Visit: Payer: Self-pay | Admitting: Family Medicine

## 2023-03-13 ENCOUNTER — Other Ambulatory Visit: Payer: Self-pay

## 2023-03-13 DIAGNOSIS — J328 Other chronic sinusitis: Secondary | ICD-10-CM

## 2023-03-13 NOTE — Telephone Encounter (Signed)
Unable to refill per protocol, Rx expired. Discontinued 02/14/23.  Requested Prescriptions  Pending Prescriptions Disp Refills   fluticasone (FLONASE) 50 MCG/ACT nasal spray 16 g 1    Sig: Place 2 sprays into both nostrils daily.     Ear, Nose, and Throat: Nasal Preparations - Corticosteroids Passed - 03/13/2023 10:01 AM      Passed - Valid encounter within last 12 months    Recent Outpatient Visits           2 weeks ago Hypertriglyceridemia   [place] [facility] Hoy Register, MD   1 month ago Cervical radiculopathy   [facility] Hoy Register, MD   4 months ago Neck pain   [place] [facility] Hoy Register, MD   6 months ago Other chronic nonsuppurative otitis media of left ear   [place] [facility] Hoy Register, MD   10 months ago Pure hypercholesterolemia   [facility] Hoy Register, MD

## 2023-03-18 ENCOUNTER — Other Ambulatory Visit: Payer: Self-pay

## 2023-03-26 ENCOUNTER — Other Ambulatory Visit: Payer: Self-pay

## 2023-04-01 ENCOUNTER — Encounter: Payer: Self-pay | Admitting: Physical Medicine and Rehabilitation

## 2023-04-01 ENCOUNTER — Ambulatory Visit (INDEPENDENT_AMBULATORY_CARE_PROVIDER_SITE_OTHER): Payer: Self-pay | Admitting: Physical Medicine and Rehabilitation

## 2023-04-01 VITALS — BP 117/79 | HR 60

## 2023-04-01 DIAGNOSIS — M47812 Spondylosis without myelopathy or radiculopathy, cervical region: Secondary | ICD-10-CM

## 2023-04-01 DIAGNOSIS — M542 Cervicalgia: Secondary | ICD-10-CM

## 2023-04-01 NOTE — Progress Notes (Signed)
Dravyn Redwood Trion - 42 y.o. male MRN [id number]  Date of birth: 04-17-1981  Office Visit Note: Visit Date: 04/01/2023 PCP: Hoy Register, MD Referred by: Hoy Register, MD  Subjective: No chief complaint on file.  HPI: Andrew Sandoval is a 42 y.o. male who comes in today per the request of Dr. Glee Arvin for evaluation of of chronic, worsening and severe left sided neck pain, pain started about 6 months ago. Spanish interpreter present during our visit today. His pain worsens with side to side rotation of his neck, describes pain as sore and aching sensation, currently rates as 8 out of 10. States his pain is constant and causes difficulty driving and sleeping. Some relief of pain with home exercise regimen, rest and use of medications. Minimal relief of pain with recent formal physical therapy/dry needling. Recent cervical MRI imaging exhibits reactive edema about the left C3-C4 and C4-C5 facets, there is also mild to moderate central canal stenosis at C6-C7. No history of lumbar surgery/injections. He is currently working full time as Public affairs consultant. Patient denies focal weakness, numbness and tingling. No recent trauma or falls.    Review of Systems  Musculoskeletal:  Positive for myalgias and neck pain.  Neurological:  Negative for tingling, sensory change, focal weakness and weakness.  All other systems reviewed and are negative.  Otherwise per HPI.  Assessment & Plan: Visit Diagnoses:    ICD-10-CM   1. Cervicalgia  M54.2     2. 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  No radicular symptoms down the arms.  Patient continues to have severe pain despite good conservative therapies such as formal physical therapy/dry needing, home exercise regimen, rest and use of medications.  I discussed recent cervical MRI today using imaging and spine model.  Patient's clinical presentation and exam are consistent with  facet mediated pain.  He does have severe pain with side-to-side rotation of his neck.  There is reactive facet marrow edema noted at the levels of C3-C4 and C4-C5 on recent cervical MRI imaging.  Next step is to perform diagnostic and hopefully therapeutic left C3-C4 and C4-C5 facet joint injections under fluoroscopic guidance.  If good relief of pain with facet joint injections we discussed possibility of longer sustained pain relief with radiofrequency ablation.  I discussed injection procedure with patient today in detail using spine model, he has no questions at this time.  We will see him back for injection pending insurance approval.  He can remain active as tolerated and can continue working.  Should his symptoms declare themselves as more radicular in nature we would consider performing cervical epidural steroid injection, no red flag symptoms noted upon exam today.     Meds & Orders: No orders of the defined types were placed in this encounter.  No orders of the defined types were placed in this encounter.   Follow-up: Return for Left C3-C4 and C4-C5 facet joint injections.   Procedures: No procedures performed      Clinical History: MRI CERVICAL SPINE WITHOUT CONTRAST   TECHNIQUE: Multiplanar, multisequence MR imaging of the cervical spine was performed. No intravenous contrast was administered.   COMPARISON:  Prior radiograph from 01/15/2023.   FINDINGS: Alignment: Reversal of the normal cervical lordosis with underlying dextroscoliosis. Trace degenerative anterolisthesis of C4 on C5 and C7 on T1.   Vertebrae: Vertebral body height maintained without acute or chronic fracture. Bone marrow signal  intensity within normal limits. No worrisome osseous lesions. Reactive edema present about the left C3-4 and C4-5 facets, as well as the right C7-T1 facet due to facet arthritis.   Cord: Normal signal and morphology.   Posterior Fossa, vertebral arteries, paraspinal  tissues: Unremarkable.   Disc levels:   C2-C3: Mild endplate and uncovertebral spurring without significant disc bulge. Severe right with mild left facet arthrosis. No spinal stenosis. Mild left with moderate right C3 foraminal stenosis.   C3-C4: Small central disc protrusion indents the ventral thecal sac (series 8, image 40). Moderate left with mild right facet arthrosis. Mild spinal stenosis. Severe left with mild right C4 foraminal narrowing.   C4-C5: Left eccentric disc bulge with endplate and uncovertebral spurring. Severe left facet arthrosis. Resultant mild spinal stenosis. Severe left with mild right C5 foraminal narrowing.   C5-C6: Mild degenerative intervertebral disc space narrowing with diffuse disc osteophyte complex. Flattening and partial effacement of the ventral thecal sac without significant spinal stenosis. Mild right C6 foraminal narrowing. Left neural foramen remains patent.   C6-C7: Degenerative vertebral disc space narrowing with diffuse disc osteophyte complex. Flattening and partial effacement of the ventral thecal sac with resultant mild to moderate spinal stenosis. Moderate left worse than right C7 foraminal narrowing.   C7-T1: Mild disc bulge with superimposed small central disc protrusion. Moderate right worse than left facet arthrosis. Mild spinal stenosis. Moderate right with mild left C8 foraminal narrowing.   IMPRESSION: 1. Multilevel cervical spondylosis with resultant mild to moderate diffuse spinal stenosis at C3-4 through C7-T1 as above. 2. Multifactorial degenerative changes with resultant multilevel foraminal narrowing as above. Notable findings include moderate right C3 foraminal stenosis, severe left C4 and C5 foraminal narrowing, moderate left worse than right C7 foraminal stenosis, with moderate right C8 foraminal narrowing. 3. Reactive edema about the left C3-4 and C4-5 facets, as well as the right C7-T1 facet due to facet  arthritis. Findings could contribute to underlying neck pain.     Electronically Signed   By: Rise Mu M.D.   On: 03/10/2023 04:34   He reports that he has quit smoking. He has never used smokeless tobacco.  Recent Labs    05/15/22 0951  HGBA1C 5.5    Objective:  VS:  HT:    WT:   BMI:     BP:117/79  HR:60bpm  TEMP: ( )  RESP:  Physical Exam Vitals and nursing note reviewed.  HENT:     Head: Normocephalic and atraumatic.     Right Ear: External ear normal.     Left Ear: External ear normal.     Nose: Nose normal.     Mouth/Throat:     Mouth: Mucous membranes are moist.  Eyes:     Extraocular Movements: Extraocular movements intact.  Cardiovascular:     Rate and Rhythm: Normal rate.     Pulses: Normal pulses.  Pulmonary:     Effort: Pulmonary effort is normal.  Abdominal:     General: Abdomen is flat. There is no distension.  Musculoskeletal:        General: Tenderness present.     Cervical back: Tenderness present.     Comments: Discomfort noted with side-to-side rotation. Patient has good strength in the upper extremities including 5 out of 5 strength in wrist extension, long finger flexion and APB. Shoulder range of motion is full bilaterally without any sign of impingement. There is no atrophy of the hands intrinsically. Sensation intact bilaterally. Tenderness noted to left levator scapulae  region. Negative Hoffman's sign. Negative Spurling's sign.     Skin:    General: Skin is warm and dry.     Capillary Refill: Capillary refill takes less than 2 seconds.  Neurological:     General: No focal deficit present.     Mental Status: He is alert and oriented to person, place, and time.  Psychiatric:        Mood and Affect: Mood normal.        Behavior: Behavior normal.     Ortho Exam  Imaging: No results found.  Past Medical/Family/Surgical/Social History: Medications & Allergies reviewed per EMR, new medications updated. Patient Active Problem  List   Diagnosis Date Noted   Bilateral carpal tunnel syndrome 02/02/2020   Chronic pruritic rash in adult 10/21/2019   Pain in both hands 08/18/2019   Acute pain of right shoulder 08/18/2019   Hyperlipidemia 06/09/2017   Enlarged tonsils 11/11/2016   Tinea pedis of right foot 11/11/2016   Numbness 11/11/2016   Positive H. pylori test 07/23/2016   Chronic nasal congestion 01/18/2015   Past Medical History:  Diagnosis Date   Cocaine use 2006-2010   Hyperlipidemia 06/09/2017   Family History  Problem Relation Age of Onset   Diabetes Neg Hx    Past Surgical History:  Procedure Laterality Date   No prior surgery     Social History   Occupational History    Comment: [employer]  Tobacco Use   Smoking status: Former   Smokeless tobacco: Never   Tobacco comments:    quit 12 years ago  Vaping Use   Vaping status: Never Used  Substance and Sexual Activity   Alcohol use: No    Alcohol/week: 0.0 standard drinks of alcohol    Comment: quit 12 years ago   Drug use: Not Currently    Types: Cocaine, Marijuana    Comment: Prior cocaine; none in 11 years   Sexual activity: Yes

## 2023-04-01 NOTE — Progress Notes (Signed)
Functional Pain Scale - descriptive words and definitions   Severe (9)  Cannot do any ADL's even with assistance can barely talk/unable to sleep and unable to use distraction. Severe range order  Average Pain 9  Patient is having neck pain that causing him to be dizzy. Pain has been happening 6-41months. Pain is worse at night and it does not radiate to arms.

## 2023-04-18 ENCOUNTER — Ambulatory Visit (INDEPENDENT_AMBULATORY_CARE_PROVIDER_SITE_OTHER): Payer: Self-pay | Admitting: Orthopaedic Surgery

## 2023-04-18 DIAGNOSIS — G5602 Carpal tunnel syndrome, left upper limb: Secondary | ICD-10-CM | POA: Insufficient documentation

## 2023-04-18 DIAGNOSIS — G5601 Carpal tunnel syndrome, right upper limb: Secondary | ICD-10-CM

## 2023-04-18 NOTE — Progress Notes (Signed)
Office Visit Note   Patient: Andrew Sandoval           Date of Birth: [birth year]           MRN: [id number] Visit Date: 04/18/2023              Requested by: Andrew Register, MD [address] [place],  [place] [address] PCP: Andrew Register, MD   Assessment & Plan: Visit Diagnoses:  1. Right carpal tunnel syndrome   2. Left carpal tunnel syndrome     Plan: Patient is a 41 year old gentleman with moderate to severe bilateral carpal tunnel syndrome.  He has had 6 cortisone injections in the right hand 4 on the left.   At this point I do not believe that any more injections are indicated and I have recommended surgical treatment.  He will think about his options and let us know how he wants to proceed.  Interpreter was present today which increased complexity of the visit.  Total face to face encounter time was greater than 25 minutes and over half of this time was spent in counseling and/or coordination of care.  Follow-Up Instructions: No follow-ups on file.   Orders:  No orders of the defined types were placed in this encounter.  No orders of the defined types were placed in this encounter.     Procedures: No procedures performed   Clinical Data: No additional findings.   Subjective: Chief Complaint  Patient presents with   Left Wrist - Pain   Right Wrist - Pain    HPI Andrew Sandoval returns today for bilateral carpal tunnel syndrome.  He has moderate to severe disease from a nerve conduction study 3 years ago.  He feels his symptoms have gotten worse.  Requesting repeat injections today.  He states that the injections give about 4 months of relief.  Review of Systems  Constitutional: Negative.   HENT: Negative.    Eyes: Negative.   Respiratory: Negative.    Cardiovascular: Negative.   Gastrointestinal: Negative.   Endocrine: Negative.   Genitourinary: Negative.   Skin: Negative.   Allergic/Immunologic: Negative.   Neurological: Negative.    Hematological: Negative.   Psychiatric/Behavioral: Negative.    All other systems reviewed and are negative.    Objective: Vital Signs: There were no vitals taken for this visit.  Physical Exam Vitals and nursing note reviewed.  Constitutional:      Appearance: He is well-developed.  HENT:     Head: Normocephalic and atraumatic.  Eyes:     Pupils: Pupils are equal, round, and reactive to light.  Pulmonary:     Effort: Pulmonary effort is normal.  Abdominal:     Palpations: Abdomen is soft.  Musculoskeletal:        General: Normal range of motion.     Cervical back: Neck supple.  Skin:    General: Skin is warm.  Neurological:     Mental Status: He is alert and oriented to person, place, and time.  Psychiatric:        Behavior: Behavior normal.        Thought Content: Thought content normal.        Judgment: Judgment normal.     Ortho Exam Exam of the right hand shows mild flattening of the thenar compartment. Exam of the left hand shows no changes from prior visit. Specialty Comments:  MRI CERVICAL SPINE WITHOUT CONTRAST   TECHNIQUE: Multiplanar, multisequence MR imaging of the cervical spine was performed.  No intravenous contrast was administered.   COMPARISON:  Prior radiograph from 01/15/2023.   FINDINGS: Alignment: Reversal of the normal cervical lordosis with underlying dextroscoliosis. Trace degenerative anterolisthesis of C4 on C5 and C7 on T1.   Vertebrae: Vertebral body height maintained without acute or chronic fracture. Bone marrow signal intensity within normal limits. No worrisome osseous lesions. Reactive edema present about the left C3-4 and C4-5 facets, as well as the right C7-T1 facet due to facet arthritis.   Cord: Normal signal and morphology.   Posterior Fossa, vertebral arteries, paraspinal tissues: Unremarkable.   Disc levels:   C2-C3: Mild endplate and uncovertebral spurring without significant disc bulge. Severe right with  mild left facet arthrosis. No spinal stenosis. Mild left with moderate right C3 foraminal stenosis.   C3-C4: Small central disc protrusion indents the ventral thecal sac (series 8, image 40). Moderate left with mild right facet arthrosis. Mild spinal stenosis. Severe left with mild right C4 foraminal narrowing.   C4-C5: Left eccentric disc bulge with endplate and uncovertebral spurring. Severe left facet arthrosis. Resultant mild spinal stenosis. Severe left with mild right C5 foraminal narrowing.   C5-C6: Mild degenerative intervertebral disc space narrowing with diffuse disc osteophyte complex. Flattening and partial effacement of the ventral thecal sac without significant spinal stenosis. Mild right C6 foraminal narrowing. Left neural foramen remains patent.   C6-C7: Degenerative vertebral disc space narrowing with diffuse disc osteophyte complex. Flattening and partial effacement of the ventral thecal sac with resultant mild to moderate spinal stenosis. Moderate left worse than right C7 foraminal narrowing.   C7-T1: Mild disc bulge with superimposed small central disc protrusion. Moderate right worse than left facet arthrosis. Mild spinal stenosis. Moderate right with mild left C8 foraminal narrowing.   IMPRESSION: 1. Multilevel cervical spondylosis with resultant mild to moderate diffuse spinal stenosis at C3-4 through C7-T1 as above. 2. Multifactorial degenerative changes with resultant multilevel foraminal narrowing as above. Notable findings include moderate right C3 foraminal stenosis, severe left C4 and C5 foraminal narrowing, moderate left worse than right C7 foraminal stenosis, with moderate right C8 foraminal narrowing. 3. Reactive edema about the left C3-4 and C4-5 facets, as well as the right C7-T1 facet due to facet arthritis. Findings could contribute to underlying neck pain.     Electronically Signed   By: Andrew Sandoval M.D.   On: 03/10/2023  04:34  Imaging: No results found.   PMFS History: Patient Active Problem List   Diagnosis Date Noted   Left carpal tunnel syndrome 04/18/2023   Right carpal tunnel syndrome 02/02/2020   Chronic pruritic rash in adult 10/21/2019   Pain in both hands 08/18/2019   Acute pain of right shoulder 08/18/2019   Hyperlipidemia 06/09/2017   Enlarged tonsils 11/11/2016   Tinea pedis of right foot 11/11/2016   Numbness 11/11/2016   Positive H. pylori test 07/23/2016   Chronic nasal congestion 01/18/2015   Past Medical History:  Diagnosis Date   Cocaine use 2006-2010   Hyperlipidemia 06/09/2017    Family History  Problem Relation Age of Onset   Diabetes Neg Hx     Past Surgical History:  Procedure Laterality Date   No prior surgery     Social History   Occupational History    Comment: [employer]  Tobacco Use   Smoking status: Former   Smokeless tobacco: Never   Tobacco comments:    quit 12 years ago  Vaping Use   Vaping status: Never Used  Substance and Sexual Activity   Alcohol  use: No    Alcohol/week: 0.0 standard drinks of alcohol    Comment: quit 12 years ago   Drug use: Not Currently    Types: Cocaine, Marijuana    Comment: Prior cocaine; none in 11 years   Sexual activity: Yes

## 2023-04-24 ENCOUNTER — Other Ambulatory Visit: Payer: Self-pay

## 2023-04-24 ENCOUNTER — Ambulatory Visit (INDEPENDENT_AMBULATORY_CARE_PROVIDER_SITE_OTHER): Payer: Self-pay | Admitting: Orthopedic Surgery

## 2023-04-24 ENCOUNTER — Encounter: Payer: Self-pay | Admitting: Orthopedic Surgery

## 2023-04-24 VITALS — BP 128/80 | HR 58 | Ht 62.0 in | Wt 174.0 lb

## 2023-04-24 DIAGNOSIS — M542 Cervicalgia: Secondary | ICD-10-CM

## 2023-04-24 MED ORDER — METHYLPREDNISOLONE 4 MG PO TBPK
ORAL_TABLET | ORAL | 0 refills | Status: DC
  Filled 2023-04-24: qty 21, 6d supply, fill #0

## 2023-04-24 MED ORDER — METHOCARBAMOL 500 MG PO TABS
500.0000 mg | ORAL_TABLET | Freq: Four times a day (QID) | ORAL | 0 refills | Status: DC | PRN
  Filled 2023-04-24: qty 50, 13d supply, fill #0

## 2023-04-24 NOTE — Progress Notes (Signed)
Orthopedic Spine Surgery Office Note  Assessment: Patient is a 42 y.o. male with neck pain that radiates into the left trapezius   Plan: -Explained that initially conservative treatment is tried as a significant number of patients may experience relief with these treatment modalities. Discussed that the conservative treatments include:  -activity modification  -physical therapy  -over the counter pain medications  -medrol dosepak  -cervical steroid injections -Patient has tried PT, topical gels -Prescribed a medrol dose pak and robaxin -Recommended diagnostic/therapeutic injection with Dr. Alvester Morin. Referral provided -Patient should return to office in 6 weeks, x-rays at next visit: none   Patient expressed understanding of the plan and all questions were answered to the patient's satisfaction.   ___________________________________________________________________________   History:  Patient is a 42 y.o. male who presents today for cervical spine.  Patient has had pain in his neck for about a year now.  There is no trauma or injury that preceded the onset of pain.  The pain is felt on the left side of the neck near the paraspinal muscles.  It radiates into the medial aspect of the trapezius muscle.  It does not radiate past that point.  He has no pain radiating into the right upper extremity.  He says he feels pain on a daily basis.  He notes that it is worse with motion at the neck.  He says especially rotation to the left makes it worse.  Rest makes it slightly better but does not go away.   Weakness: Denies Difficulty with fine motor skills (e.g., buttoning shirts, handwriting): Denies Symptoms of imbalance: Denies Paresthesias and numbness: Yes, in his bilateral hands.  Has been seeing Dr. Roda Shutters for carpal tunnel syndrome.  No other numbness or paresthesias Bowel or bladder incontinence: Denies Saddle anesthesia: Denies  Treatments tried: PT, topical gels  Review of  systems: Denies fevers and chills, night sweats, unexplained weight loss, history of cancer, pain that wakes them at night  Past medical history: HLD  Allergies: tylenol  Past surgical history:  None  Social history: Denies use of nicotine product (smoking, vaping, patches, smokeless) Alcohol use: Denies Denies recreational drug use   Physical Exam:  BMI of 31.8  General: no acute distress, appears stated age Neurologic: alert, answering questions appropriately, following commands Respiratory: unlabored breathing on room air, symmetric chest rise Psychiatric: appropriate affect, normal cadence to speech   MSK (spine):  -Strength exam      Left  Right Grip strength                5/5  5/5 Interosseus   5/5   5/5 Wrist extension  5/5  5/5 Wrist flexion   5/5  5/5 Elbow flexion   5/5  5/5 Deltoid    5/5  5/5  EHL    5/5  5/5 TA    5/5  5/5 GSC    5/5  5/5 Knee extension  5/5  5/5 Hip flexion   5/5  5/5  -Sensory exam    Sensation intact to light touch in L3-S1 nerve distributions of bilateral lower extremities  Sensation intact to light touch in C5-T1 nerve distributions of bilateral upper extremities  -Brachioradialis DTR: 2/4 on the left, 2/4 on the right -Biceps DTR: 22/4 on the left, 2/4 on the right -Achilles DTR: 2/4 on the left, 2/4 on the right -Patellar tendon DTR: 2/4 on the left, 2/4 on the right  -Spurling: Negative bilaterally -Hoffman sign: Negative bilaterally -Clonus: No beats bilaterally -Interosseous wasting:  None seen -Grip and release test: Negative -Romberg: Negative -Gait: Normal  Left shoulder exam: No pain through range of motion, negative Jobe, negative belly press, no weakness with external rotation with arm at side Right shoulder exam: No pain through range of motion  Imaging: XRs of the cervical spine from 04/24/2023 were independently reviewed and interpreted, showing disc height loss at C5/6 and C6/7 with anterior  osteophyte formation. No fracture or dislocation seen. No evidence of instability on flexion/extension views.   MRI of the cervical spine from 03/04/2023 was independently reviewed and interpreted, showing Left sided foraminal stenosis at C3/4. Left paracentral disc herniation at C4/5. Central stenosis and left sided foraminal stenosis at C4/5. Central stenosis and left sided foraminal stenosis at C6/7. DDD at C5/6 and C6/7.    Patient name: Andrew Sandoval Patient MRN: [id number] Date of visit: 04/24/23

## 2023-05-27 ENCOUNTER — Ambulatory Visit (INDEPENDENT_AMBULATORY_CARE_PROVIDER_SITE_OTHER): Payer: Self-pay | Admitting: Physical Medicine and Rehabilitation

## 2023-05-27 ENCOUNTER — Other Ambulatory Visit: Payer: Self-pay

## 2023-05-27 VITALS — BP 122/77 | HR 58

## 2023-05-27 DIAGNOSIS — M47812 Spondylosis without myelopathy or radiculopathy, cervical region: Secondary | ICD-10-CM

## 2023-05-27 MED ORDER — METHYLPREDNISOLONE ACETATE 40 MG/ML IJ SUSP
40.0000 mg | Freq: Once | INTRAMUSCULAR | Status: AC
Start: 2023-05-27 — End: 2023-05-27
  Administered 2023-05-27: 40 mg

## 2023-05-27 NOTE — Progress Notes (Signed)
Functional Pain Scale - descriptive words and definitions  Immobilizing (10)   Unable to move or talk due to intensity of pain/unable to sleep and unable to use distraction. Severe range order  Average Pain 10    -Driver, -BT, -Dye Allergies.  Pain in neck ROM

## 2023-05-27 NOTE — Patient Instructions (Signed)

## 2023-06-05 ENCOUNTER — Ambulatory Visit: Payer: Self-pay | Admitting: Orthopedic Surgery

## 2023-06-05 ENCOUNTER — Other Ambulatory Visit: Payer: Self-pay

## 2023-06-05 DIAGNOSIS — M542 Cervicalgia: Secondary | ICD-10-CM

## 2023-06-05 MED ORDER — METHOCARBAMOL 500 MG PO TABS
500.0000 mg | ORAL_TABLET | Freq: Three times a day (TID) | ORAL | 1 refills | Status: DC | PRN
  Filled 2023-06-05: qty 60, 20d supply, fill #0

## 2023-06-05 MED ORDER — LIDOCAINE 5 % EX PTCH
1.0000 | MEDICATED_PATCH | CUTANEOUS | 0 refills | Status: DC
  Filled 2023-06-05: qty 30, 30d supply, fill #0

## 2023-06-05 NOTE — Progress Notes (Signed)
Orthopedic Spine Surgery Office Note   Assessment: Patient is a 42 y.o. male with left-sided neck pain.  No radicular symptoms     Plan: -Patient has tried PT, topical gels, medrol dose pak, robaxin, cervical facet injections -Provided him with a refill of Robaxin.  Sent in a prescription for lidocaine patches -Given his pain is localized to the neck, do not recommend any cervical spine surgery at this time -Patient should return to office in 8 weeks, x-rays at next visit: none     Patient expressed understanding of the plan and all questions were answered to the patient's satisfaction.    ___________________________________________________________________________     History:   Patient is a 42 y.o. male who presents today for follow-up on his cervical spine.  After our last visit, patient tried Robaxin and a cervical facet injection.  He feels that the facet injections were helpful and the Robaxin has been riding with some relief.  He says that his pain is about 50% better since starting his treatments.  He is not having any pain radiating into either upper extremity.  His pain is localized to the left side of his neck.  He has not developed any new symptoms since he was last seen.    Treatments tried: PT, topical gels, medrol dose pak, robaxin, cervical facet injections    Physical Exam:   General: no acute distress, appears stated age Neurologic: alert, answering questions appropriately, following commands Respiratory: unlabored breathing on room air, symmetric chest rise Psychiatric: appropriate affect, normal cadence to speech     MSK (spine):   -Strength exam                                                   Left                  Right Grip strength                5/5                  5/5 Interosseus                  5/5                  5/5 Wrist extension            5/5                  5/5 Wrist flexion                 5/5                  5/5 Elbow flexion                 5/5                  5/5 Deltoid                          5/5                  5/5   -Sensory exam                           Sensation intact  to light touch in C5-T1 nerve distributions of bilateral upper extremities   -Spurling: Negative bilaterally -Hoffman sign: Negative bilaterally -Clonus: No beats bilaterally -Interosseous wasting: None seen -Grip and release test: Negative -Romberg: Negative -Gait: Normal   Imaging: XRs of the cervical spine from 04/24/2023 were previously independently reviewed and interpreted, showing disc height loss at C5/6 and C6/7 with anterior osteophyte formation. No fracture or dislocation seen. No evidence of instability on flexion/extension views.    MRI of the cervical spine from 03/04/2023 was previously independently reviewed and interpreted, showing Left sided foraminal stenosis at C3/4. Left paracentral disc herniation at C4/5. Central stenosis and left sided foraminal stenosis at C4/5. Central stenosis and left sided foraminal stenosis at C6/7. DDD at C5/6 and C6/7.      Patient name: Andrew Sandoval Patient MRN: [id number] Date of visit: 06/05/23

## 2023-06-08 NOTE — Progress Notes (Signed)
Andrew Sandoval - 42 y.o. male MRN [id number]  Date of birth: 1980/12/09  Office Visit Note: Visit Date: 05/27/2023 PCP: Hoy Register, MD Referred by: London Sheer, MD  Subjective: Chief Complaint  Patient presents with   Neck - Pain   HPI:  Andrew Sandoval is a 42 y.o. male who comes in today at the request of Dr. Willia Craze for planned Left  C3-4 and C4-5 Cervical facet/medial branch block with fluoroscopic guidance.  The patient has failed conservative care including home exercise, medications, time and activity modification.  This injection will be diagnostic and hopefully therapeutic.  Please see requesting physician notes for further details and justification.  Exam has shown concordant pain with facet joint loading.   ROS Otherwise per HPI.  Assessment & Plan: Visit Diagnoses:    ICD-10-CM   1. Cervical spondylosis without myelopathy  M47.812 XR C-ARM NO REPORT    Facet Injection    methylPREDNISolone acetate (DEPO-MEDROL) injection 40 mg      Plan: No additional findings.   Meds & Orders:  Meds ordered this encounter  Medications   methylPREDNISolone acetate (DEPO-MEDROL) injection 40 mg    Orders Placed This Encounter  Procedures   Facet Injection   XR C-ARM NO REPORT    Follow-up: Return for visit to requesting provider as needed.   Procedures: No procedures performed  Cervical Facet Joint Intra-Articular Injection with Fluoroscopic Guidance  Patient: Andrew Sandoval      Date of Birth: 1980/11/10 MRN: [id number] PCP: Hoy Register, MD      Visit Date: 05/27/2023   Universal Protocol:    Date/Time: 12/29/244:02 PM  Consent Given By: the patient  Position: PRONE  Additional Comments: Vital signs were monitored before and after the procedure. Patient was prepped and draped in the usual sterile fashion. The correct patient, procedure, and site was verified.   Injection Procedure Details:  Procedure Site One Meds  Administered:  Meds ordered this encounter  Medications   methylPREDNISolone acetate (DEPO-MEDROL) injection 40 mg     Laterality: Left  Location/Site:  C3-4 C4-5  Needle size: 25 G  Needle type: Spinal  Needle Placement: Articular  Findings:  -Contrast Used: 0.5 mL iohexol 180 mg iodine/mL   -Comments: Excellent flow of contrast producing a partial arthrogram.  Procedure Details: The region overlying the facet joints mentioned above were localized under fluoroscopic visualization. The needle was inserted down to the level of the lateral mass of the superior articular process of the facet joint to be injected. Then, the needle was "walked off" inferiorly into the lateral aspect of the facet joint. Bi-planar images were used for confirming placement and spot radiographs were documented.  A 0.25 ml volume of Omnipaque-240 was injected into the facet joint and a standard partial arthrogram was obtained. Radiographs were obtained of the arthrogram. A 0.5 ml. volume of the steroid/anesthetic solution was injected into the joint. This procedure was repeated for each facet joint injected.   Additional Comments:  No complications occurred Dressing: Band-Aid    Post-procedure details: Patient was observed during the procedure. Post-procedure instructions were reviewed.  Patient left the clinic in stable condition.   Clinical History: MRI CERVICAL SPINE WITHOUT CONTRAST   TECHNIQUE: Multiplanar, multisequence MR imaging of the cervical spine was performed. No intravenous contrast was administered.   COMPARISON:  Prior radiograph from 01/15/2023.   FINDINGS: Alignment: Reversal of the normal cervical lordosis with underlying dextroscoliosis. Trace degenerative anterolisthesis of C4 on C5 and C7  on T1.   Vertebrae: Vertebral body height maintained without acute or chronic fracture. Bone marrow signal intensity within normal limits. No worrisome osseous lesions. Reactive edema  present about the left C3-4 and C4-5 facets, as well as the right C7-T1 facet due to facet arthritis.   Cord: Normal signal and morphology.   Posterior Fossa, vertebral arteries, paraspinal tissues: Unremarkable.   Disc levels:   C2-C3: Mild endplate and uncovertebral spurring without significant disc bulge. Severe right with mild left facet arthrosis. No spinal stenosis. Mild left with moderate right C3 foraminal stenosis.   C3-C4: Small central disc protrusion indents the ventral thecal sac (series 8, image 40). Moderate left with mild right facet arthrosis. Mild spinal stenosis. Severe left with mild right C4 foraminal narrowing.   C4-C5: Left eccentric disc bulge with endplate and uncovertebral spurring. Severe left facet arthrosis. Resultant mild spinal stenosis. Severe left with mild right C5 foraminal narrowing.   C5-C6: Mild degenerative intervertebral disc space narrowing with diffuse disc osteophyte complex. Flattening and partial effacement of the ventral thecal sac without significant spinal stenosis. Mild right C6 foraminal narrowing. Left neural foramen remains patent.   C6-C7: Degenerative vertebral disc space narrowing with diffuse disc osteophyte complex. Flattening and partial effacement of the ventral thecal sac with resultant mild to moderate spinal stenosis. Moderate left worse than right C7 foraminal narrowing.   C7-T1: Mild disc bulge with superimposed small central disc protrusion. Moderate right worse than left facet arthrosis. Mild spinal stenosis. Moderate right with mild left C8 foraminal narrowing.   IMPRESSION: 1. Multilevel cervical spondylosis with resultant mild to moderate diffuse spinal stenosis at C3-4 through C7-T1 as above. 2. Multifactorial degenerative changes with resultant multilevel foraminal narrowing as above. Notable findings include moderate right C3 foraminal stenosis, severe left C4 and C5 foraminal narrowing, moderate left  worse than right C7 foraminal stenosis, with moderate right C8 foraminal narrowing. 3. Reactive edema about the left C3-4 and C4-5 facets, as well as the right C7-T1 facet due to facet arthritis. Findings could contribute to underlying neck pain.     Electronically Signed   By: Rise Mu M.D.   On: 03/10/2023 04:34     Objective:  VS:  HT:    WT:   BMI:     BP:122/77  HR:(!) 58bpm  TEMP: ( )  RESP:  Physical Exam Vitals and nursing note reviewed.  Constitutional:      General: He is not in acute distress.    Appearance: Normal appearance. He is not ill-appearing.  HENT:     Head: Normocephalic and atraumatic.     Right Ear: External ear normal.     Left Ear: External ear normal.  Eyes:     Extraocular Movements: Extraocular movements intact.  Cardiovascular:     Rate and Rhythm: Normal rate.     Pulses: Normal pulses.  Abdominal:     General: There is no distension.     Palpations: Abdomen is soft.  Musculoskeletal:        General: No signs of injury.     Cervical back: Neck supple. Tenderness present. No rigidity.     Right lower leg: No edema.     Left lower leg: No edema.     Comments: Patient has good strength in the upper extremities with 5 out of 5 strength in wrist extension long finger flexion APB.  No intrinsic hand muscle atrophy.  Negative Hoffmann's test.  Lymphadenopathy:     Cervical: No cervical adenopathy.  Skin:    Findings: No erythema or rash.  Neurological:     General: No focal deficit present.     Mental Status: He is alert and oriented to person, place, and time.     Sensory: No sensory deficit.     Motor: No weakness or abnormal muscle tone.     Coordination: Coordination normal.  Psychiatric:        Mood and Affect: Mood normal.        Behavior: Behavior normal.      Imaging: No results found.

## 2023-06-08 NOTE — Procedures (Signed)
Cervical Facet Joint Intra-Articular Injection with Fluoroscopic Guidance  Patient: Andrew Sandoval      Date of Birth: Sep 29, 1980 MRN: [id number] PCP: Hoy Register, MD      Visit Date: 05/27/2023   Universal Protocol:    Date/Time: 12/29/244:02 PM  Consent Given By: the patient  Position: PRONE  Additional Comments: Vital signs were monitored before and after the procedure. Patient was prepped and draped in the usual sterile fashion. The correct patient, procedure, and site was verified.   Injection Procedure Details:  Procedure Site One Meds Administered:  Meds ordered this encounter  Medications   methylPREDNISolone acetate (DEPO-MEDROL) injection 40 mg     Laterality: Left  Location/Site:  C3-4 C4-5  Needle size: 25 G  Needle type: Spinal  Needle Placement: Articular  Findings:  -Contrast Used: 0.5 mL iohexol 180 mg iodine/mL   -Comments: Excellent flow of contrast producing a partial arthrogram.  Procedure Details: The region overlying the facet joints mentioned above were localized under fluoroscopic visualization. The needle was inserted down to the level of the lateral mass of the superior articular process of the facet joint to be injected. Then, the needle was "walked off" inferiorly into the lateral aspect of the facet joint. Bi-planar images were used for confirming placement and spot radiographs were documented.  A 0.25 ml volume of Omnipaque-240 was injected into the facet joint and a standard partial arthrogram was obtained. Radiographs were obtained of the arthrogram. A 0.5 ml. volume of the steroid/anesthetic solution was injected into the joint. This procedure was repeated for each facet joint injected.   Additional Comments:  No complications occurred Dressing: Band-Aid    Post-procedure details: Patient was observed during the procedure. Post-procedure instructions were reviewed.  Patient left the clinic in stable condition.

## 2023-06-18 ENCOUNTER — Other Ambulatory Visit: Payer: Self-pay

## 2023-08-06 ENCOUNTER — Other Ambulatory Visit: Payer: Self-pay

## 2023-08-06 ENCOUNTER — Ambulatory Visit (INDEPENDENT_AMBULATORY_CARE_PROVIDER_SITE_OTHER): Payer: Self-pay | Admitting: Orthopedic Surgery

## 2023-08-06 DIAGNOSIS — M542 Cervicalgia: Secondary | ICD-10-CM

## 2023-08-06 MED ORDER — LIDOCAINE 5 % EX PTCH
1.0000 | MEDICATED_PATCH | CUTANEOUS | 0 refills | Status: DC
  Filled 2023-08-06 – 2023-08-20 (×2): qty 30, 30d supply, fill #0

## 2023-08-06 NOTE — Progress Notes (Signed)
 Orthopedic Spine Surgery Office Note   Assessment: Patient is a 43 y.o. male neck pain has resolved but he still having significant left trapezius pain.  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  No signs or symptoms of myelopathy     Plan: -Patient has tried PT, topical gels, medrol dose pak, robaxin, cervical facet injections, lidocaine patches -Since his pain is now seeming more radicular in nature and he is not having as much neck pain, talked about ACDF at C4/5 as a treatment option.  He was not interested in any kind of surgery -Sent in a prescription for lidocaine patches and provided him with a referral to pain management -Patient should return to office on an as-needed basis     Patient expressed understanding of the plan and all questions were answered to the patient's satisfaction.    ___________________________________________________________________________     History:   Patient is a 43 y.o. male who presents today for follow-up on his cervical spine.  Patient states since his last visit, his neck pain has resolved.  He has pain radiating into his left trapezius area.  He does not notice any pain past the shoulder on that side.  No pain radiating to his right upper extremity.  He feels the pain at all times.  It does not get better with rest.  He has not noticed any trouble with fine motor skills in the hand.  He has not had any unsteadiness or imbalance.  No bowel or bladder incontinence.    Treatments tried: PT, topical gels, medrol dose pak, robaxin, cervical facet injections, lidocaine patches     Physical Exam:   General: no acute distress, appears stated age Neurologic: alert, answering questions appropriately, following commands Respiratory: unlabored breathing on room air, symmetric chest rise Psychiatric: appropriate affect, normal cadence to speech     MSK (spine):   -Strength exam                                                    Left                  Right Grip strength                5/5                  5/5 Interosseus                  5/5                  5/5 Wrist extension            5/5                  5/5 Wrist flexion                 5/5                  5/5 Elbow flexion                5/5                  5/5 Deltoid  5/5                  5/5   -Sensory exam                           Sensation intact to light touch in C5-T1 nerve distributions of bilateral upper extremities   -Spurling: Negative bilaterally -Hoffman sign: Negative bilaterally -Clonus: No beats bilaterally -Interosseous wasting: None seen -Grip and release test: Negative -Romberg: Negative -Gait: Normal  -Left shoulder exam: No pain through range of motion, negative Jobe, no weakness with external rotation with arm at side, negative belly press   Imaging: XRs of the cervical spine from 04/24/2023 were previously independently reviewed and interpreted, showing disc height loss at C5/6 and C6/7 with anterior osteophyte formation. No fracture or dislocation seen. No evidence of instability on flexion/extension views.    MRI of the cervical spine from 03/04/2023 was previously independently reviewed and interpreted, showing Left sided foraminal stenosis at C3/4. Left paracentral disc herniation at C4/5. Central stenosis and left sided foraminal stenosis at C4/5. Central stenosis and left sided foraminal stenosis at C6/7. DDD at C5/6 and C6/7.      Patient name: Andrew Sandoval Patient MRN: [id number] Date of visit: 08/06/23

## 2023-08-18 ENCOUNTER — Other Ambulatory Visit: Payer: Self-pay

## 2023-08-20 ENCOUNTER — Other Ambulatory Visit: Payer: Self-pay | Admitting: Family Medicine

## 2023-08-20 ENCOUNTER — Encounter: Payer: Self-pay | Admitting: Physician Assistant

## 2023-08-20 ENCOUNTER — Encounter (HOSPITAL_COMMUNITY): Payer: Self-pay

## 2023-08-20 ENCOUNTER — Other Ambulatory Visit: Payer: Self-pay

## 2023-08-20 ENCOUNTER — Telehealth: Payer: Self-pay | Admitting: Family Medicine

## 2023-08-20 ENCOUNTER — Ambulatory Visit (HOSPITAL_COMMUNITY)
Admission: EM | Admit: 2023-08-20 | Discharge: 2023-08-20 | Disposition: A | Discharge status: Home / Self Care | Payer: Self-pay | Attending: Sports Medicine | Admitting: Sports Medicine

## 2023-08-20 ENCOUNTER — Ambulatory Visit: Payer: Self-pay | Attending: Physician Assistant | Admitting: Physician Assistant

## 2023-08-20 VITALS — BP 126/80 | HR 58 | Temp 98.2°F | Resp 16 | Ht 62.0 in | Wt 167.0 lb

## 2023-08-20 DIAGNOSIS — J328 Other chronic sinusitis: Secondary | ICD-10-CM

## 2023-08-20 DIAGNOSIS — M25512 Pain in left shoulder: Secondary | ICD-10-CM

## 2023-08-20 DIAGNOSIS — M5412 Radiculopathy, cervical region: Secondary | ICD-10-CM

## 2023-08-20 DIAGNOSIS — M79601 Pain in right arm: Secondary | ICD-10-CM

## 2023-08-20 DIAGNOSIS — E7800 Pure hypercholesterolemia, unspecified: Secondary | ICD-10-CM

## 2023-08-20 DIAGNOSIS — M7542 Impingement syndrome of left shoulder: Secondary | ICD-10-CM

## 2023-08-20 DIAGNOSIS — L309 Dermatitis, unspecified: Secondary | ICD-10-CM

## 2023-08-20 DIAGNOSIS — E781 Pure hyperglyceridemia: Secondary | ICD-10-CM

## 2023-08-20 DIAGNOSIS — L308 Other specified dermatitis: Secondary | ICD-10-CM

## 2023-08-20 DIAGNOSIS — Z758 Other problems related to medical facilities and other health care: Secondary | ICD-10-CM

## 2023-08-20 DIAGNOSIS — Z09 Encounter for follow-up examination after completed treatment for conditions other than malignant neoplasm: Secondary | ICD-10-CM

## 2023-08-20 DIAGNOSIS — T7840XD Allergy, unspecified, subsequent encounter: Secondary | ICD-10-CM

## 2023-08-20 DIAGNOSIS — M25511 Pain in right shoulder: Secondary | ICD-10-CM

## 2023-08-20 DIAGNOSIS — J302 Other seasonal allergic rhinitis: Secondary | ICD-10-CM

## 2023-08-20 DIAGNOSIS — M5010 Cervical disc disorder with radiculopathy, unspecified cervical region: Secondary | ICD-10-CM

## 2023-08-20 MED ORDER — MELOXICAM 7.5 MG PO TABS
7.5000 mg | ORAL_TABLET | Freq: Every day | ORAL | 1 refills | Status: DC
Start: 2023-08-20 — End: 2024-04-07
  Filled 2023-08-20: qty 30, 30d supply, fill #0

## 2023-08-20 MED ORDER — FENOFIBRATE 145 MG PO TABS
145.0000 mg | ORAL_TABLET | Freq: Every day | ORAL | 1 refills | Status: DC
  Filled 2023-08-20: qty 90, 90d supply, fill #0

## 2023-08-20 MED ORDER — BETAMETHASONE DIPROPIONATE AUG 0.05 % EX OINT
TOPICAL_OINTMENT | Freq: Two times a day (BID) | CUTANEOUS | 2 refills | Status: DC
Start: 2023-08-20 — End: 2024-04-07
  Filled 2023-08-20: qty 50, 30d supply, fill #0
  Filled 2023-12-25: qty 50, 30d supply, fill #1

## 2023-08-20 MED ORDER — LIDOCAINE HCL (PF) 1 % IJ SOLN
INTRAMUSCULAR | Status: AC
  Filled 2023-08-20: qty 2

## 2023-08-20 MED ORDER — FLUTICASONE PROPIONATE 50 MCG/ACT NA SUSP
2.0000 | Freq: Every day | NASAL | 6 refills | Status: DC
Start: 2023-08-20 — End: 2024-04-07
  Filled 2023-08-20 (×2): qty 16, 30d supply, fill #0
  Filled 2023-09-24: qty 16, 30d supply, fill #1
  Filled 2023-10-28: qty 16, 30d supply, fill #2
  Filled 2023-12-25: qty 16, 30d supply, fill #3
  Filled 2024-02-26: qty 16, 30d supply, fill #4

## 2023-08-20 MED ORDER — ATORVASTATIN CALCIUM 80 MG PO TABS
80.0000 mg | ORAL_TABLET | Freq: Every day | ORAL | 1 refills | Status: DC
  Filled 2023-08-20 (×2): qty 90, 90d supply, fill #0
  Filled 2023-11-27: qty 30, 30d supply, fill #1
  Filled 2023-12-25: qty 30, 30d supply, fill #2
  Filled 2024-02-26: qty 30, 30d supply, fill #3

## 2023-08-20 MED ORDER — METHYLPREDNISOLONE ACETATE 40 MG/ML IJ SUSP
40.0000 mg | Freq: Once | INTRAMUSCULAR | Status: AC
  Administered 2023-08-20: 40 mg via INTRA_ARTICULAR

## 2023-08-20 MED ORDER — METHYLPREDNISOLONE ACETATE 40 MG/ML IJ SUSP
INTRAMUSCULAR | Status: AC
  Filled 2023-08-20: qty 1

## 2023-08-20 MED ORDER — LIDOCAINE HCL (PF) 1 % IJ SOLN
2.0000 mL | Freq: Once | INTRAMUSCULAR | Status: AC
  Administered 2023-08-20: 2 mL

## 2023-08-20 MED ORDER — CETIRIZINE HCL 10 MG PO TABS
10.0000 mg | ORAL_TABLET | Freq: Every day | ORAL | 11 refills | Status: DC
Start: 2023-08-20 — End: 2024-03-24
  Filled 2023-08-20 (×2): qty 30, 30d supply, fill #0
  Filled 2023-09-24 (×2): qty 30, 30d supply, fill #1
  Filled 2023-10-28: qty 30, 30d supply, fill #2
  Filled 2023-12-25: qty 30, 30d supply, fill #3
  Filled 2024-02-26: qty 30, 30d supply, fill #4
  Filled 2024-03-23: qty 30, 30d supply, fill #5

## 2023-08-20 MED ORDER — METHYLPREDNISOLONE SODIUM SUCC 125 MG IJ SOLR
INTRAMUSCULAR | Status: AC
  Filled 2023-08-20: qty 2

## 2023-08-20 NOTE — ED Provider Notes (Addendum)
 [facility]    CSN: [id number] Arrival date & time: 08/20/23  1052      History   Chief Complaint Chief Complaint  Patient presents with   Arm Pain    HPI Andrew Sandoval is a 43 y.o. male.   Andrew Sandoval is a 43yo spanish speaking male here with 4 days of left shoulder pain. He is RHD. Denies inciting injury. Endorses constant shoulder pain and pain with overhead movement. Denies weakness. Has been using ibuprofen 800mg  daily without much benefit. On chart review he also has history of significant cervical neck pain with cervical disc herniation at C4/5. He follows with Dr. Christell Constant at [facility] for this. He endorses chronic intermittent radicular cramping down his left arm with numbness and tingling in his left hand. He has previously gotten neck injections which he states were helpful.  The history is provided by the patient. The history is limited by a language barrier. A language interpreter was used.  Arm Pain    Past Medical History:  Diagnosis Date   Cocaine use 2006-2010   Hyperlipidemia 06/09/2017    Patient Active Problem List   Diagnosis Date Noted   Left carpal tunnel syndrome 04/18/2023   Right carpal tunnel syndrome 02/02/2020   Chronic pruritic rash in adult 10/21/2019   Pain in both hands 08/18/2019   Acute pain of right shoulder 08/18/2019   Hyperlipidemia 06/09/2017   Enlarged tonsils 11/11/2016   Tinea pedis of right foot 11/11/2016   Numbness 11/11/2016   Positive H. pylori test 07/23/2016   Chronic nasal congestion 01/18/2015    Past Surgical History:  Procedure Laterality Date   No prior surgery         Home Medications    Prior to Admission medications   Medication Sig Start Date End Date Taking? Authorizing Provider  atorvastatin (LIPITOR) 80 MG tablet Take 1 tablet (80 mg total) by mouth daily. 11/06/22   Hoy Register, MD  betamethasone dipropionate 0.05 % cream Apply topically 2 (two) times daily. 01/23/23   Hoy Register,  MD  DULoxetine (CYMBALTA) 60 MG capsule Take 1 capsule (60 mg total) by mouth daily. For cervical radiculopathy 01/23/23   Hoy Register, MD  fenofibrate (TRICOR) 145 MG tablet Take 1 tablet (145 mg total) by mouth daily. 11/06/22   Hoy Register, MD  lidocaine (LIDODERM) 5 % Place 1 patch onto the skin daily. Remove & Discard patch within 12 hours or as directed by MD 08/06/23   London Sheer, MD  meloxicam (MOBIC) 7.5 MG tablet Take 1 tablet (7.5 mg total) by mouth daily. 02/26/23   Hoy Register, MD  methocarbamol (ROBAXIN) 500 MG tablet Take 1 tablet (500 mg total) by mouth every 8 (eight) hours as needed (pain, muscle spasms). 06/05/23   London Sheer, MD  methylPREDNISolone (MEDROL DOSEPAK) 4 MG TBPK tablet Take as prescribed on the box 04/24/23   London Sheer, MD  traMADol (ULTRAM) 50 MG tablet Take 1 tablet (50 mg total) by mouth every 12 (twelve) hours as needed. 03/11/23   Cristie Hem, PA-C    Family History Family History  Problem Relation Age of Onset   Diabetes Neg Hx     Social History Social History   Tobacco Use   Smoking status: Former   Smokeless tobacco: Never   Tobacco comments:    quit 12 years ago  Vaping Use   Vaping status: Never Used  Substance Use Topics   Alcohol use: No  Alcohol/week: 0.0 standard drinks of alcohol    Comment: quit 12 years ago   Drug use: Not Currently    Types: Cocaine, Marijuana    Comment: Prior cocaine; none in 11 years     Allergies   Tylenol [acetaminophen]   Review of Systems Review of Systems  Musculoskeletal:  Positive for arthralgias and neck pain.  Neurological:  Positive for numbness.     Physical Exam Triage Vital Signs ED Triage Vitals  Encounter Vitals Group     BP 08/20/23 1131 137/77     Systolic BP Percentile --      Diastolic BP Percentile --      Pulse Rate 08/20/23 1131 60     Resp 08/20/23 1131 16     Temp 08/20/23 1131 97.8 F (36.6 C)     Temp Source 08/20/23 1131 Oral      SpO2 08/20/23 1131 96 %     Weight --      Height --      Head Circumference --      Peak Flow --      Pain Score 08/20/23 1135 8     Pain Loc --      Pain Education --      Exclude from Growth Chart --    No data found.  Updated Vital Signs BP 137/77 (BP Location: Right Arm)   Pulse 60   Temp 97.8 F (36.6 C) (Oral)   Resp 16   SpO2 96%   Visual Acuity Right Eye Distance:   Left Eye Distance:   Bilateral Distance:    Right Eye Near:   Left Eye Near:    Bilateral Near:     Physical Exam Constitutional:      General: He is not in acute distress.    Appearance: Normal appearance. He is normal weight. He is not toxic-appearing.  Neck:     Comments: No gross deformity, swelling, bruising. TTP along left paraspinals and trap.  minimal midline/bony TTP. ROM limited. RUE strength 5/5.  LUE strength as examined in MSK section. Sensation intact to light touch.   2+ equal reflexes in triceps, biceps, brachioradialis tendons. Grossly + spurlings to the left. NV intact distal BUEs.  Musculoskeletal:     Comments: Left Shoulder: No swelling, ecchymoses.  No gross deformity. TTP along trapezius and over lateral shoulder. No AC joint or collar bone TTP. ROM slightly limited by pain with flexion and abduction, about 160d both. ER and IR normal. Positive Hawkins, Neers. Negative Yergasons. Strength 4+/5 with empty can and resisted internal/external rotation compared to right, mildly uncomfortable with each of these.  Neurological:     Mental Status: He is alert.    MRI of cervical spine from 02/2023 reviewed as below: 1. Multilevel cervical spondylosis with resultant mild to moderate diffuse spinal stenosis at C3-4 through C7-T1 as above. 2. Multifactorial degenerative changes with resultant multilevel foraminal narrowing as above. Notable findings include moderate right C3 foraminal stenosis, severe left C4 and C5 foraminal narrowing, moderate left worse than right C7  foraminal stenosis, with moderate right C8 foraminal narrowing. 3. Reactive edema about the left C3-4 and C4-5 facets, as well as the right C7-T1 facet due to facet arthritis. Findings could contribute to underlying neck pain.  UC Treatments / Results  Labs (all labs ordered are listed, but only abnormal results are displayed) Labs Reviewed - No data to display  EKG   Radiology No results found.  Procedures Procedures (including critical care  time) Left Subacromial Injection Procedure: After informed written consent timeout was performed, patient was in seated position on exam table.  Left shoulder was prepped with alcohol swab x2. Ethyl chloride spray used for topical anesthetic. Utilizing the posterior approach and a 25g needle, the left subacromial space was injected with 2:1 lidocaine 1% and depomedrol 40mg /mL.  Following the injection a bandage was applied to the area. Patient tolerated procedure well without immediate complications. The patient was counseled as to the expected post-injection course, including the possibility of worsening of pain with steroid flare. Instructed as to concerning symptoms and advised to contact the office if these should arise.   Medications Ordered in UC Medications  lidocaine (PF) (XYLOCAINE) 1 % injection 2 mL (2 mLs Other Given 08/20/23 1231)  methylPREDNISolone acetate (DEPO-MEDROL) injection 40 mg (40 mg Intra-articular Given 08/20/23 1234)    Initial Impression / Assessment and Plan / UC Course  I have reviewed the triage vital signs and the nursing notes.  Pertinent labs & imaging results that were available during my care of the patient were reviewed by me and considered in my medical decision making (see chart for details).    Patient is well-appearing, normotensive, afebrile, not tachycardic, not tachypneic, oxygenating well on room air.   Cervical disc disorder with radiculopathy of cervical region  Shoulder impingement syndrome,  left Overall, vitals and exam are reassuring. He has chronic cervical radiculopathy with multilevel facet arthropathy and foraminal stenosis, most notably at C4-5. I suspect this is the main driver of his pain and radicular symptoms, though his main complaint today is left shoulder pain and does have some testing c/w impingement.  -Offered PO prednisone course f/b NSAIDs to target both his neck and shoulder vs cortisone injection in the shoulder alone and he elected on the latter. Injection performed as above. -Encouraged follow-up with Dr. Christell Constant as I suspect he would benefit from operative management vs consideration of injection under fluoroscopy. -Return and ER precautions discussed Patient's questions were answered and they are in agreement with this plan  Final Clinical Impressions(s) / UC Diagnoses   Final diagnoses:  Cervical disc disorder with radiculopathy of cervical region  Shoulder impingement syndrome, left     Discharge Instructions      Hoy le inyectamos cortisona en el hombro izquierdo. El dolor en el brazo y el dolor de cuello no van a mejorar con esta inyeccin, ya que estn relacionados con nervios pinzados en el cuello. Me gustara que consultara con el Dr. Christell Constant de [facility] para hablar sobre una ciruga u otra inyeccin en el cuello. Llame a su consultorio para programarla (Phone: [phone]).   ED Prescriptions   None    PDMP not reviewed this encounter.   Marisa Cyphers, MD 08/20/23 1301    Marisa Cyphers, MD 08/20/23 [phone]

## 2023-08-20 NOTE — Progress Notes (Signed)
 Medication refill:  Cholesterol, medication for fungal infection on feet  Left arm cramping localized to the left arm. 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

## 2023-08-20 NOTE — Progress Notes (Signed)
 Patient ID: Andrew Sandoval, male   DOB: [birth year], 43 y.o.   MRN: [id number]   Andrew Sandoval, is a 43 y.o. male  NWG:[id number]  [id number]:[id number]  DOB - 25-Apr-1981  Chief Complaint  Patient presents with   fungus infection skin   Allergies   arm cramping       Subjective:   Andrew Sandoval is a 43 y.o. male here today for meds for shoulder discomfort.  He was seen in the ED today and given an prednisone injection into his R shoulder.  Needs a med to help with pain until injection kicks in.  Also c/o long standing rash on B feet for about 4 years. Started on the top of his R foot and now on both feet.  He has had a biopsy and has seen dermatology but nothing makes it go away.  The only result I am able to see from 2021 is that it was NOT fungal.  There is itching.  He does not have the rash elsewhere.  He has a derm appt in 2 months.    Needs RF nasal spray for allergies that are flaring up with pollen.   No problems updated.  ALLERGIES: Allergies  Allergen Reactions   Tylenol [Acetaminophen] Hives    PAST MEDICAL HISTORY: Past Medical History:  Diagnosis Date   Cocaine use 2006-2010   Hyperlipidemia 06/09/2017    MEDICATIONS AT HOME: Prior to Admission medications   Medication Sig Start Date End Date Taking? Authorizing Provider  augmented betamethasone dipropionate (DIPROLENE) 0.05 % ointment Apply topically 2 (two) times daily. 08/20/23  Yes Anders Simmonds, PA-C  cetirizine (ZYRTEC) 10 MG tablet Take 1 tablet (10 mg total) by mouth daily. For rash and itching 08/20/23  Yes Anders Simmonds, PA-C  fluticasone [facility]) 50 MCG/ACT nasal spray Place 2 sprays into both nostrils daily. 08/20/23  Yes Georgian Co M, PA-C  atorvastatin (LIPITOR) 80 MG tablet Take 1 tablet (80 mg total) by mouth daily. 08/20/23   Anders Simmonds, PA-C  DULoxetine (CYMBALTA) 60 MG capsule Take 1 capsule (60 mg total) by mouth daily. For cervical radiculopathy 01/23/23   Hoy Register, MD  fenofibrate (TRICOR) 145 MG tablet Take 1 tablet (145 mg total) by mouth daily. 08/20/23   Anders Simmonds, PA-C  lidocaine (LIDODERM) 5 % Place 1 patch onto the skin daily. Remove & Discard patch within 12 hours or as directed by MD 08/06/23   London Sheer, MD  meloxicam (MOBIC) 7.5 MG tablet Take 1 tablet (7.5 mg total) by mouth daily. 08/20/23   Anders Simmonds, PA-C  methocarbamol (ROBAXIN) 500 MG tablet Take 1 tablet (500 mg total) by mouth every 8 (eight) hours as needed (pain, muscle spasms). 06/05/23   London Sheer, MD  traMADol (ULTRAM) 50 MG tablet Take 1 tablet (50 mg total) by mouth every 12 (twelve) hours as needed. 03/11/23   Cristie Hem, PA-C    ROS: Neg HEENT Neg resp Neg cardiac Neg GI Neg GU Neg psych Neg neuro  Objective:   Vitals:   08/20/23 1440  BP: 126/80  Pulse: (!) 58  Resp: 16  Temp: 98.2 F (36.8 C)  TempSrc: Oral  Weight: 167 lb (75.8 kg)  Height: 5\' 2"  (1.575 m)   Exam General appearance : Awake, alert, not in any distress. Speech Clear. Not toxic looking HEENT: Atraumatic and Normocephalic Neck: Supple, no JVD. No cervical lymphadenopathy.  Chest: Good air entry bilaterally, CTAB.  No rales/rhonchi/wheezing CVS: S1 S2 regular, no murmurs.  Abdomen: Bowel sounds present, Non tender and not distended with no gaurding, rigidity or rebound. Extremities: B/L Lower Ext shows no edema, both legs are warm to touch Neurology: Awake alert, and oriented X 3, CN II-XII intact, Non focal Skin: dyshidrotic appearing rash on tops of feet with some lichenification.  No erythema  Data Review Lab Results  Component Value Date   HGBA1C 5.5 05/15/2022   HGBA1C 5.4 09/25/2016   HGBA1C 5.50 01/18/2015    Assessment & Plan   1. Cervical radiculopathy (Primary) S/p injection at ED this am - meloxicam (MOBIC) 7.5 MG tablet; Take 1 tablet (7.5 mg total) by mouth daily.  Dispense: 30 tablet; Refill: 1  2. Right arm pain - meloxicam  (MOBIC) 7.5 MG tablet; Take 1 tablet (7.5 mg total) by mouth daily.  Dispense: 30 tablet; Refill: 1  3. Acute pain of right shoulder   4. Dermatitis Fresh aloe from grocery(showed photos)-apply several times a day.  Good hydration.  Keep derm appt - cetirizine (ZYRTEC) 10 MG tablet; Take 1 tablet (10 mg total) by mouth daily. For rash and itching  Dispense: 30 tablet; Refill: 11 - augmented betamethasone dipropionate (DIPROLENE) 0.05 % ointment; Apply topically 2 (two) times daily.  Dispense: 50 g; Refill: 2  5. Other eczema - cetirizine (ZYRTEC) 10 MG tablet; Take 1 tablet (10 mg total) by mouth daily. For rash and itching  Dispense: 30 tablet; Refill: 11 - augmented betamethasone dipropionate (DIPROLENE) 0.05 % ointment; Apply topically 2 (two) times daily.  Dispense: 50 g; Refill: 2  6. Language barrier [employer]" interpreters used and additional time performing visit was required.   7. Hypertriglyceridemia - fenofibrate (TRICOR) 145 MG tablet; Take 1 tablet (145 mg total) by mouth daily.  Dispense: 90 tablet; Refill: 1  8. Allergy, subsequent encounter - cetirizine (ZYRTEC) 10 MG tablet; Take 1 tablet (10 mg total) by mouth daily. For rash and itching  Dispense: 30 tablet; Refill: 11 - fluticasone (FLONASE) 50 MCG/ACT nasal spray; Place 2 sprays into both nostrils daily.  Dispense: 16 g; Refill: 6  9. Pure hypercholesterolemia - atorvastatin (LIPITOR) 80 MG tablet; Take 1 tablet (80 mg total) by mouth daily.  Dispense: 90 tablet; Refill: 1  10. Encounter for examination following treatment at hospital    Return in about 3 months (around 11/20/2023) for PCP for chronic conditions-[place].  The patient was given clear instructions to go to ER or return to medical center if symptoms don't improve, worsen or new problems develop. The patient verbalized understanding. The patient was told to call to get lab results if they haven't heard anything in the next week.      Georgian Co, PA-C [facility] [place], [place] [phone]   08/20/2023, 3:16 PM

## 2023-08-20 NOTE — Telephone Encounter (Signed)
 error

## 2023-08-20 NOTE — ED Triage Notes (Signed)
 Per Interpreter- Fabricio [phone]   Patient c/o intermittent left arm pain and numbness x 4 days. Patient states the pain is cramps in his left arm. Patient denies any injury.  Patient has not taken any medication for his symptoms.

## 2023-08-20 NOTE — Patient Instructions (Addendum)
 Aloe vera leaf Drink 64 to 80 ounces water daily  Hoja de aloe vera Beba de 64 a 80 onzas de agua al [employer].    Piel inflamada (dermatitis atpica): Qu debe saber Inflamed Skin (Atopic Dermatitis): What to Know La dermatitis atpica es una afeccin de la piel que produce sequedad, picazn e inflamacin en la piel. Es el tipo ms frecuente de [place], el cual es un grupo de afecciones cutneas que hacen que la piel se sienta spera e hinchada. Esta afeccin a menudo empeora en invierno y mejora en verano. Habitualmente la dermatitis atpica comienza a manifestarse en la niez y puede durar hasta la Estate manager/land agent. No es contagiosa, as que no se transmite de [place] persona a [place]. Los sntomas pueden empeorar cuando se tiene un brote. Durante un brote, los sntomas pueden empeorar y [place]. Cules son las causas? Se desconoce la causa exacta de esta afeccin. Los brotes pueden desencadenarse por: Contacto con las cosas a la que es sensible o Best boy. Librarian, academic. Algunos alimentos. Clima muy clido o muy fro. Jabones y sustancias qumicas fuertes. Aire seco. Cloro. Qu incrementa el riesgo? Es ms probable que presente esta afeccin si usted tiene antecedentes familiares de lo siguiente: Eccema. Alergias. Asma. Fiebre del heno. Cules son los signos o sntomas?  Piel seca y escamosa. Erupcin roja, marrn, prpura o griscea. Picazn. Piel que se engrosa y se agrieta con Museum/gallery conservator. Cmo se diagnostica? Esta afeccin se diagnostica en funcin de lo siguiente: Sntomas. Examen fsico. Antecedentes mdicos. Cmo se trata? No hay cura para esta afeccin, pero usted [employer]. Hgalo de esta forma: Controle la picazn y la necesidad de rascarse con antihistamnicos o cremas con corticoesteroides. Evite los alrgenos o desencadenantes. Controlar el estrs. Pruebe la terapia con luz, tambin llamada fototerapia, si otros tratamientos no funcionan o si tiene la afeccin en  todo el cuerpo. Siga estas indicaciones en su casa: Cuidado de la piel  Mantenga la piel hidratada. Para hacer esto: Utilice lociones sin fragancia que contengan vaselina. Evite las lociones con alcohol o agua. Estas pueden secar ms la piel. Dese duchas o baos de inmersin cortos (que duren menos de 5 minutos). Use agua tibia en lugar de agua caliente. Use jabones suaves sin fragancia. Evite el bao de burbujas. Aplquese locin inmediatamente despus de baarse. No se ponga nada sobre la piel sin Science writer a su mdico. Indicaciones generales [place] o aplquese los medicamentos solamente como se lo hayan indicado. Use ropa de algodn o mezcla de algodn. Vstase con ropa ligera para evitar la picazn que Haematologist. Cuando lave la ropa, [address] [address] para quitar todo el [place]. Use jabn que no tenga tintes ni perfumes. Evite los factores desencadenantes que le causen brotes. Evite rascarse. Puede empeorar la erupcin y Higher education careers adviser, y provocar una infeccin. Mantenga las uas cortas para no lastimar la piel al rascarse. Evite a las personas que tengan herpes labial o ampollas febriles. Estas infecciones pueden empeorar la afeccin. Concurra a todas las visitas de seguimiento para asegurarse de que el plan de tratamiento est dando resultado. Comunquese con un mdico si: La picazn le afecta el sueo. La erupcin empeora o no mejora despus de una semana de [place]. Tiene fiebre. Le aparece una erupcin despus de estar cerca de alguien con llagas o ampollas febriles. Siente calor o le sale pus de la zona de la erupcin cutnea. Le aparecen pus o costras amarillas en la zona de la erupcin. Esta informacin no tiene como fin  reemplazar el consejo del mdico. Asegrese de hacerle al mdico cualquier pregunta que tenga. Document Revised: 12/09/2022 Document Reviewed: 12/09/2022 Elsevier Patient Education  2024 [employer].

## 2023-08-20 NOTE — Discharge Instructions (Addendum)
 Hoy le inyectamos cortisona en el hombro izquierdo. El dolor en el brazo y el dolor de cuello no van a mejorar con esta inyeccin, ya que estn relacionados con nervios pinzados en el cuello. Me gustara que consultara con el Dr. Christell Constant de [facility] para hablar sobre una ciruga u otra inyeccin en el cuello. Llame a su consultorio para programarla (Phone: [phone]).

## 2023-08-22 ENCOUNTER — Other Ambulatory Visit: Payer: Self-pay

## 2023-09-03 ENCOUNTER — Ambulatory Visit (INDEPENDENT_AMBULATORY_CARE_PROVIDER_SITE_OTHER): Payer: Self-pay | Admitting: Orthopedic Surgery

## 2023-09-03 DIAGNOSIS — M5412 Radiculopathy, cervical region: Secondary | ICD-10-CM

## 2023-09-03 NOTE — Progress Notes (Signed)
 Orthopedic Spine Surgery Office Note   Assessment: Patient is a 43 y.o. male neck pain has resolved but he still having significant left trapezius pain.  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  Initially had been having more neck pain but now is having pain radiating into his left upper extremity to the level of the hand     Plan: -Patient has tried PT, topical gels, medrol dose pak, robaxin, cervical injections, lidocaine patches -Talk about his option.  He has tried multiple nonoperative treatments.  We have previously talked about surgery but he was not interested.  He still is not wanting to proceed with surgery.  He asked about an injection.  I feel that this is a reasonable nonoperative treatment especially for his radicular type pain.  A referral was provided to him today -Patient should to office in 8 weeks, x-rays at next visit: AP/lateral/flex/ex cervical     Patient expressed understanding of the plan and all questions were answered to the patient's satisfaction.    ___________________________________________________________________________     History:   Patient is a 43 y.o. male who presents today for follow-up on his cervical spine.  Patient had been previously seen for neck pain in the office.  He had some pain in his trapezius.  His pain is now changed.  He is now having pain radiating into the left upper extremity.  He feels a going along the anterior arm and into the anterior forearm.  He says he gets cramps in the same distribution as the pain.  He is not having any pain in his right upper extremity.  He says he feels this pain and cramping with activity and at rest.  He has not developed any unsteadiness with gait.  He has no trouble with fine motor skills in his hands.  He rates the pain as an 8 out of 10.   Treatments tried: PT, topical gels, medrol dose pak, robaxin, cervical facet injections, lidocaine patches     Physical Exam:    General: no acute distress, appears stated age Neurologic: alert, answering questions appropriately, following commands Respiratory: unlabored breathing on room air, symmetric chest rise Psychiatric: appropriate affect, normal cadence to speech     MSK (spine):   -Strength exam                                                   Left                  Right Grip strength                5/5                  5/5 Interosseus                  5/5                  5/5 Wrist extension            5/5                  5/5 Wrist flexion                 5/5  5/5 Elbow flexion                5/5                  5/5 Deltoid                          5/5                  5/5   -Sensory exam                           Sensation intact to light touch in C4-T1 nerve distributions of bilateral upper extremities   -Spurling: Negative bilaterally -Hoffman sign: Negative bilaterally -Clonus: No beats bilaterally -Interosseous wasting: None seen -Grip and release test: Negative -Romberg: Negative -Gait: Normal   -Left shoulder exam: No pain through range of motion, negative Jobe, no weakness with external rotation with arm at side, negative belly press   Imaging: XRs of the cervical spine from 04/24/2023 were previously independently reviewed and interpreted, showing disc height loss at C5/6 and C6/7 with anterior osteophyte formation. No fracture or dislocation seen. No evidence of instability on flexion/extension views.    MRI of the cervical spine from 03/04/2023 was previously independently reviewed and interpreted, showing Left sided foraminal stenosis at C3/4. Left paracentral disc herniation at C4/5. Central stenosis and left sided foraminal stenosis at C4/5. Central stenosis and left sided foraminal stenosis at C6/7. DDD at C5/6 and C6/7.      Patient name: Andrew Sandoval Patient MRN: [id number] Date of visit: 09/03/23

## 2023-09-24 ENCOUNTER — Other Ambulatory Visit: Payer: Self-pay

## 2023-10-03 ENCOUNTER — Telehealth: Payer: Self-pay

## 2023-10-03 NOTE — Telephone Encounter (Signed)
 Patient was scheduled to have right C7-T1 interlam esi on 10/15/2023. Unable to come in that day but would like to R/S. Please call to Reschedule appt. I can help call him. He speaks spanish only.

## 2023-10-07 NOTE — Telephone Encounter (Signed)
Called patient again no answer. Will try again later. 

## 2023-10-07 NOTE — Telephone Encounter (Signed)
Called patient no answer. LMOM with details.  

## 2023-10-08 ENCOUNTER — Encounter: Payer: Self-pay | Admitting: Physical Medicine and Rehabilitation

## 2023-10-08 NOTE — Telephone Encounter (Signed)
 Is this still available?

## 2023-10-08 NOTE — Telephone Encounter (Signed)
 Called patient no answer.  Per Mity  May 8th at 2 pm available.

## 2023-10-08 NOTE — Telephone Encounter (Signed)
Already has an appt

## 2023-10-15 ENCOUNTER — Encounter: Payer: Self-pay | Admitting: Physical Medicine and Rehabilitation

## 2023-10-16 ENCOUNTER — Encounter: Payer: Self-pay | Admitting: Physical Medicine and Rehabilitation

## 2023-10-22 ENCOUNTER — Encounter: Payer: Self-pay | Admitting: Physical Medicine and Rehabilitation

## 2023-10-22 ENCOUNTER — Ambulatory Visit: Payer: Self-pay | Admitting: Physical Medicine and Rehabilitation

## 2023-10-22 ENCOUNTER — Other Ambulatory Visit: Payer: Self-pay

## 2023-10-22 VITALS — BP 131/81 | HR 55

## 2023-10-22 DIAGNOSIS — M5412 Radiculopathy, cervical region: Secondary | ICD-10-CM

## 2023-10-22 MED ORDER — METHYLPREDNISOLONE ACETATE 40 MG/ML IJ SUSP
40.0000 mg | Freq: Once | INTRAMUSCULAR | Status: AC
  Administered 2023-10-22: 40 mg

## 2023-10-22 NOTE — Progress Notes (Signed)
 Pain Scale   Average Pain 7 Patient advised his neck pain radiates to his left shoulder area, without relief        +Driver, -BT, -Dye Allergies.

## 2023-10-22 NOTE — Patient Instructions (Signed)

## 2023-10-28 ENCOUNTER — Other Ambulatory Visit: Payer: Self-pay

## 2023-10-28 ENCOUNTER — Ambulatory Visit: Payer: Self-pay | Admitting: Dermatology

## 2023-10-28 ENCOUNTER — Ambulatory Visit: Payer: Self-pay | Admitting: Orthopedic Surgery

## 2023-10-29 ENCOUNTER — Other Ambulatory Visit: Payer: Self-pay

## 2023-10-29 ENCOUNTER — Ambulatory Visit: Payer: Self-pay | Admitting: Orthopedic Surgery

## 2023-10-29 NOTE — Procedures (Signed)
 Cervical Epidural Steroid Injection - Interlaminar Approach with Fluoroscopic Guidance  Patient: Andrew Sandoval      Date of Birth: 10-09-1980 MRN: [id number] PCP: Joaquin Mulberry, MD      Visit Date: 10/22/2023   Universal Protocol:    Date/Time: 05/21/256:03 AM  Consent Given By: the patient  Position: PRONE  Additional Comments: Vital signs were monitored before and after the procedure. Patient was prepped and draped in the usual sterile fashion. The correct patient, procedure, and site was verified.   Injection Procedure Details:   Procedure diagnoses: Radiculopathy, cervical region [M54.12]    Meds Administered:  Meds ordered this encounter  Medications   methylPREDNISolone  acetate (DEPO-MEDROL ) injection 40 mg     Laterality: Right  Location/Site: C7-T1  Needle: 3.5 in., 20 ga. Tuohy  Needle Placement: Paramedian epidural space  Findings:  -Comments: Excellent flow of contrast into the epidural space.  Procedure Details: Using a paramedian approach from the side mentioned above, the region overlying the inferior lamina was localized under fluoroscopic visualization and the soft tissues overlying this structure were infiltrated with 4 ml. of 1% Lidocaine  without Epinephrine. A # 20 gauge, Tuohy needle was inserted into the epidural space using a paramedian approach.  The epidural space was localized using loss of resistance along with contralateral oblique bi-planar fluoroscopic views.  After negative aspirate for air, blood, and CSF, a 2 ml. volume of Isovue-250 was injected into the epidural space and the flow of contrast was observed. Radiographs were obtained for documentation purposes.   The injectate was administered into the level noted above.  Additional Comments:  The patient tolerated the procedure well Dressing: 2 x 2 sterile gauze and Band-Aid    Post-procedure details: Patient was observed during the procedure. Post-procedure instructions  were reviewed.  Patient left the clinic in stable condition.

## 2023-10-29 NOTE — Progress Notes (Signed)
 Farouk Vivero Cruger - 43 y.o. male MRN [id number]  Date of birth: 12-02-1980  Office Visit Note: Visit Date: 10/22/2023 PCP: Joaquin Mulberry, MD Referred by: Diedra Fowler, MD  Subjective: Chief Complaint  Patient presents with   Neck - Pain   HPI:  Praneel Haisley is a 43 y.o. male who comes in today at the request of Dr. Colette Davies for planned Right C7-T1 Cervical Interlaminar epidural steroid injection with fluoroscopic guidance.  The patient has failed conservative care including home exercise, medications, time and activity modification.  This injection will be diagnostic and hopefully therapeutic.  Please see requesting physician notes for further details and justification.   ROS Otherwise per HPI.  Assessment & Plan: Visit Diagnoses:    ICD-10-CM   1. Radiculopathy, cervical region  M54.12 XR C-ARM NO REPORT    Epidural Steroid injection    methylPREDNISolone  acetate (DEPO-MEDROL ) injection 40 mg      Plan: No additional findings.   Meds & Orders:  Meds ordered this encounter  Medications   methylPREDNISolone  acetate (DEPO-MEDROL ) injection 40 mg    Orders Placed This Encounter  Procedures   XR C-ARM NO REPORT   Epidural Steroid injection    Follow-up: Return for visit to requesting provider as needed.   Procedures: No procedures performed  Cervical Epidural Steroid Injection - Interlaminar Approach with Fluoroscopic Guidance  Patient: Heber Hoog      Date of Birth: 18-Jun-1980 MRN: [id number] PCP: Joaquin Mulberry, MD      Visit Date: 10/22/2023   Universal Protocol:    Date/Time: 05/21/256:03 AM  Consent Given By: the patient  Position: PRONE  Additional Comments: Vital signs were monitored before and after the procedure. Patient was prepped and draped in the usual sterile fashion. The correct patient, procedure, and site was verified.   Injection Procedure Details:   Procedure diagnoses: Radiculopathy, cervical region [M54.12]     Meds Administered:  Meds ordered this encounter  Medications   methylPREDNISolone  acetate (DEPO-MEDROL ) injection 40 mg     Laterality: Right  Location/Site: C7-T1  Needle: 3.5 in., 20 ga. Tuohy  Needle Placement: Paramedian epidural space  Findings:  -Comments: Excellent flow of contrast into the epidural space.  Procedure Details: Using a paramedian approach from the side mentioned above, the region overlying the inferior lamina was localized under fluoroscopic visualization and the soft tissues overlying this structure were infiltrated with 4 ml. of 1% Lidocaine  without Epinephrine. A # 20 gauge, Tuohy needle was inserted into the epidural space using a paramedian approach.  The epidural space was localized using loss of resistance along with contralateral oblique bi-planar fluoroscopic views.  After negative aspirate for air, blood, and CSF, a 2 ml. volume of Isovue-250 was injected into the epidural space and the flow of contrast was observed. Radiographs were obtained for documentation purposes.   The injectate was administered into the level noted above.  Additional Comments:  The patient tolerated the procedure well Dressing: 2 x 2 sterile gauze and Band-Aid    Post-procedure details: Patient was observed during the procedure. Post-procedure instructions were reviewed.  Patient left the clinic in stable condition.   Clinical History: MRI CERVICAL SPINE WITHOUT CONTRAST   TECHNIQUE: Multiplanar, multisequence MR imaging of the cervical spine was performed. No intravenous contrast was administered.   COMPARISON:  Prior radiograph from 01/15/2023.   FINDINGS: Alignment: Reversal of the normal cervical lordosis with underlying dextroscoliosis. Trace degenerative anterolisthesis of C4 on C5 and C7 on T1.  Vertebrae: Vertebral body height maintained without acute or chronic fracture. Bone marrow signal intensity within normal limits. No worrisome osseous  lesions. Reactive edema present about the left C3-4 and C4-5 facets, as well as the right C7-T1 facet due to facet arthritis.   Cord: Normal signal and morphology.   Posterior Fossa, vertebral arteries, paraspinal tissues: Unremarkable.   Disc levels:   C2-C3: Mild endplate and uncovertebral spurring without significant disc bulge. Severe right with mild left facet arthrosis. No spinal stenosis. Mild left with moderate right C3 foraminal stenosis.   C3-C4: Small central disc protrusion indents the ventral thecal sac (series 8, image 40). Moderate left with mild right facet arthrosis. Mild spinal stenosis. Severe left with mild right C4 foraminal narrowing.   C4-C5: Left eccentric disc bulge with endplate and uncovertebral spurring. Severe left facet arthrosis. Resultant mild spinal stenosis. Severe left with mild right C5 foraminal narrowing.   C5-C6: Mild degenerative intervertebral disc space narrowing with diffuse disc osteophyte complex. Flattening and partial effacement of the ventral thecal sac without significant spinal stenosis. Mild right C6 foraminal narrowing. Left neural foramen remains patent.   C6-C7: Degenerative vertebral disc space narrowing with diffuse disc osteophyte complex. Flattening and partial effacement of the ventral thecal sac with resultant mild to moderate spinal stenosis. Moderate left worse than right C7 foraminal narrowing.   C7-T1: Mild disc bulge with superimposed small central disc protrusion. Moderate right worse than left facet arthrosis. Mild spinal stenosis. Moderate right with mild left C8 foraminal narrowing.   IMPRESSION: 1. Multilevel cervical spondylosis with resultant mild to moderate diffuse spinal stenosis at C3-4 through C7-T1 as above. 2. Multifactorial degenerative changes with resultant multilevel foraminal narrowing as above. Notable findings include moderate right C3 foraminal stenosis, severe left C4 and C5  foraminal narrowing, moderate left worse than right C7 foraminal stenosis, with moderate right C8 foraminal narrowing. 3. Reactive edema about the left C3-4 and C4-5 facets, as well as the right C7-T1 facet due to facet arthritis. Findings could contribute to underlying neck pain.     Electronically Signed   By: Virgia Griffins M.D.   On: 03/10/2023 04:34     Objective:  VS:  HT:    WT:   BMI:     BP:131/81  HR:(!) 55bpm  TEMP: ( )  RESP:  Physical Exam Vitals and nursing note reviewed.  Constitutional:      General: He is not in acute distress.    Appearance: Normal appearance. He is not ill-appearing.  HENT:     Head: Normocephalic and atraumatic.     Right Ear: External ear normal.     Left Ear: External ear normal.  Eyes:     Extraocular Movements: Extraocular movements intact.  Cardiovascular:     Rate and Rhythm: Normal rate.     Pulses: Normal pulses.  Abdominal:     General: There is no distension.     Palpations: Abdomen is soft.  Musculoskeletal:        General: No signs of injury.     Cervical back: Neck supple. Tenderness present. No rigidity.     Right lower leg: No edema.     Left lower leg: No edema.     Comments: Patient has good strength in the upper extremities with 5 out of 5 strength in wrist extension long finger flexion APB.  No intrinsic hand muscle atrophy.  Negative Hoffmann's test.  Lymphadenopathy:     Cervical: No cervical adenopathy.  Skin:  Findings: No erythema or rash.  Neurological:     General: No focal deficit present.     Mental Status: He is alert and oriented to person, place, and time.     Sensory: No sensory deficit.     Motor: No weakness or abnormal muscle tone.     Coordination: Coordination normal.  Psychiatric:        Mood and Affect: Mood normal.        Behavior: Behavior normal.      Imaging: No results found.

## 2023-11-06 ENCOUNTER — Other Ambulatory Visit: Payer: Self-pay

## 2023-11-06 ENCOUNTER — Ambulatory Visit (INDEPENDENT_AMBULATORY_CARE_PROVIDER_SITE_OTHER): Payer: Self-pay | Admitting: Orthopedic Surgery

## 2023-11-06 VITALS — BP 122/79 | HR 57

## 2023-11-06 DIAGNOSIS — M542 Cervicalgia: Secondary | ICD-10-CM

## 2023-11-06 NOTE — Progress Notes (Addendum)
 Orthopedic Spine Surgery Office Note   Assessment: Patient is a 43 y.o. male neck pain has resolved but he still having significant left trapezius pain.  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  Initially had been having more neck pain but now his pain is mostly in the left upper extremity     Plan: -Patient has tried PT, topical gels, medrol  dose pak, robaxin , cervical injections, lidocaine  patches - I went over again that he has has tried multiple conservative treatments without any lasting relief.  I talked about pain management as surgery as his remaining options.  He was not interested in either option today.  He wanted to think about it more.  He wants to revisit the office and go over things after he is had some time to think -Patient should to office in 4 weeks, x-rays at next visit: None     Patient expressed understanding of the plan and all questions were answered to the patient's satisfaction.    ___________________________________________________________________________     History:   Patient is a 43 y.o. male who presents today for follow-up on his cervical spine.  Patient continues to have neck pain that radiates into the left upper extremity.  He feels the going along the lateral aspect of the arm and into the forearm.  He notices cramps in the same distribution as the pain.  Is not having pain in his right upper extremity.  He feels the pain going all the way to the level of the hand.  He has not noticed any changes in his symptoms since he was last seen.  He tried an injection which provided him with some relief but it did not last.   Treatments tried: PT, topical gels, medrol  dose pak, robaxin , cervical facet injections, lidocaine  patches     Physical Exam:   General: no acute distress, appears stated age Neurologic: alert, answering questions appropriately, following commands Respiratory: unlabored breathing on room air, symmetric chest  rise Psychiatric: appropriate affect, normal cadence to speech     MSK (spine):   -Strength exam                                                   Left                  Right Grip strength                5/5                  5/5 Interosseus                  5/5                  5/5 Wrist extension            5/5                  5/5 Wrist flexion                 5/5                  5/5 Elbow flexion                5/5  5/5 Deltoid                          5/5                  5/5   -Sensory exam                           Sensation intact to light touch in C4-T1 nerve distributions of bilateral upper extremities   -Left shoulder exam: No pain through range of motion, negative Jobe, no weakness with external rotation with arm at side, negative belly press   Imaging: XRs of the cervical spine from 11/06/2023 were independently reviewed and interpreted, showing disc height loss with anterior osteophyte formation at C5/6 and C6/7.  No other significant degenerative changes seen.  No evidence of instability on flexion/extension views.  No fracture or dislocation seen.   MRI of the cervical spine from 03/04/2023 was previously independently reviewed and interpreted, showing Left sided foraminal stenosis at C3/4. Left paracentral disc herniation at C4/5. Central stenosis and left sided foraminal stenosis at C4/5. Central stenosis and left sided foraminal stenosis at C6/7. DDD at C5/6 and C6/7.      Patient name: Andrew Sandoval Patient MRN: [id number] Date of visit: 11/06/23

## 2023-11-12 ENCOUNTER — Encounter: Payer: Self-pay | Admitting: Physician Assistant

## 2023-11-12 ENCOUNTER — Ambulatory Visit: Payer: Self-pay | Admitting: Physician Assistant

## 2023-11-12 VITALS — BP 124/78

## 2023-11-12 DIAGNOSIS — L700 Acne vulgaris: Secondary | ICD-10-CM

## 2023-11-12 DIAGNOSIS — L280 Lichen simplex chronicus: Secondary | ICD-10-CM

## 2023-11-12 NOTE — Patient Instructions (Signed)

## 2023-11-12 NOTE — Progress Notes (Signed)
   New Patient Visit   Subjective  Andrew Sandoval is a 43 y.o. male who presents for the following: Rash of bilateral feet x 4 years. He was treated for onychomycosis which did not help. In April of 2021, he had a KOH test which was negative and he was given Halobetasol . He states it only helped a little bit. 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 He does not have any pain but it is very itchy.   He also has a cyst of his left post neck that he would to have checked.  Accompanied by interpreter today.   The following portions of the chart were reviewed this encounter and updated as appropriate: medications, allergies, medical history  Review of Systems:  No other skin or systemic complaints except as noted in HPI or Assessment and Plan.  Objective  Well appearing patient in no apparent distress; mood and affect are within normal limits.   A focused examination was performed of the following areas: Feet   Relevant exam findings are noted in the Assessment and Plan.         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   Recommend changing shoes from Crocs to avoid rubbing. Recommend regular shoes that you tie.    CLOSED COMEDONE OF LEFT POST NECK  Treatment Plan: Recommend warm compression  LICHEN SIMPLEX CHRONICUS   CLOSED COMEDONE    Return if symptoms worsen or fail to improve.  I, Eliot Guernsey, CMA, am acting as scribe for Dante Roudebush K, PA-C .   Documentation: I have reviewed the above documentation for accuracy and completeness, and I agree with the above.  Marlissa Emerick K, PA-C

## 2023-11-25 ENCOUNTER — Encounter: Payer: Self-pay | Admitting: Family Medicine

## 2023-11-25 ENCOUNTER — Ambulatory Visit: Payer: Self-pay | Attending: Family Medicine | Admitting: Family Medicine

## 2023-11-25 ENCOUNTER — Other Ambulatory Visit: Payer: Self-pay

## 2023-11-25 VITALS — BP 120/79 | HR 64 | Temp 98.3°F | Ht 62.0 in | Wt 170.8 lb

## 2023-11-25 DIAGNOSIS — Z131 Encounter for screening for diabetes mellitus: Secondary | ICD-10-CM

## 2023-11-25 DIAGNOSIS — E7800 Pure hypercholesterolemia, unspecified: Secondary | ICD-10-CM

## 2023-11-25 DIAGNOSIS — E781 Pure hyperglyceridemia: Secondary | ICD-10-CM

## 2023-11-25 DIAGNOSIS — J019 Acute sinusitis, unspecified: Secondary | ICD-10-CM

## 2023-11-25 DIAGNOSIS — Z1159 Encounter for screening for other viral diseases: Secondary | ICD-10-CM

## 2023-11-25 MED ORDER — BENZONATATE 100 MG PO CAPS
100.0000 mg | ORAL_CAPSULE | Freq: Two times a day (BID) | ORAL | 0 refills | Status: DC | PRN
  Filled 2023-11-25: qty 20, 10d supply, fill #0

## 2023-11-25 MED ORDER — AMOXICILLIN 500 MG PO CAPS
500.0000 mg | ORAL_CAPSULE | Freq: Three times a day (TID) | ORAL | 0 refills | Status: AC
  Filled 2023-11-25: qty 30, 10d supply, fill #0

## 2023-11-25 NOTE — Progress Notes (Signed)
 Subjective:  Patient ID: Andrew Sandoval, male    DOB: [birth year]  Age: 43 y.o. MRN: [id number]  CC: No chief complaint on file.     Discussed the use of AI scribe software for clinical note transcription with the patient, who gave verbal consent to proceed.  History of Present Illness Andrew Sandoval is a 43 year old male with a history of hyperlipidemia, cervical radiculopathy. who presents with cough and fever.  He has had a dry cough for four days, with occasional yellow mucus. There is a sensation of mucus in the throat that is difficult to expel. He experiences body aches and a sensation of warmth, suggesting fever, although he has not measured his temperature. He denies chills and joint pain.  He is currently on Zyrtec  and Flonase . He is on medications for cholesterol and triglycerides (atorvastatin  and fenofibrate ), last lipid panel revealed elevated cholesterol and elevated triglyceride levels.    Past Medical History:  Diagnosis Date   Cocaine use 2006-2010   Hyperlipidemia 06/09/2017    Past Surgical History:  Procedure Laterality Date   No prior surgery      Family History  Problem Relation Age of Onset   Diabetes Neg Hx     Social History   Socioeconomic History   Marital status: Single    Spouse name: Not on file   Number of children: Not on file   Years of education: Not on file   Highest education level: Not on file  Occupational History    Comment: [employer]  Tobacco Use   Smoking status: Former   Smokeless tobacco: Never   Tobacco comments:    quit 12 years ago  Vaping Use   Vaping status: Never Used  Substance and Sexual Activity   Alcohol use: No    Alcohol/week: 0.0 standard drinks of alcohol    Comment: quit 12 years ago   Drug use: Not Currently    Types: Cocaine, Marijuana    Comment: Prior cocaine; none in 11 years   Sexual activity: Yes  Other Topics Concern   Not on file  Social History Narrative   Not on file    Social Drivers of Health   Financial Resource Strain: Not on file  Food Insecurity: Not on file  Transportation Needs: Not on file  Physical Activity: Not on file  Stress: Not on file  Social Connections: Not on file    Allergies  Allergen Reactions   Tylenol [Acetaminophen] Hives    Outpatient Medications Prior to Visit  Medication Sig Dispense Refill   atorvastatin  (LIPITOR) 80 MG tablet Take 1 tablet (80 mg total) by mouth daily. 90 tablet 1   augmented betamethasone  dipropionate (DIPROLENE ) 0.05 % ointment Apply topically 2 (two) times daily. 50 g 2   cetirizine  (ZYRTEC ) 10 MG tablet Take 1 tablet (10 mg total) by mouth daily. For rash and itching 30 tablet 11   DULoxetine  (CYMBALTA ) 60 MG capsule Take 1 capsule (60 mg total) by mouth daily. For cervical radiculopathy 30 capsule 3   fenofibrate  (TRICOR ) 145 MG tablet Take 1 tablet (145 mg total) by mouth daily. 90 tablet 1   fluticasone  (FLONASE ) 50 MCG/ACT nasal spray Place 2 sprays into both nostrils daily. 16 g 6   lidocaine  (LIDODERM ) 5 % Place 1 patch onto the skin daily. Remove & Discard patch within 12 hours or as directed by MD 30 patch 0   meloxicam  (MOBIC ) 7.5 MG tablet Take 1 tablet (7.5 mg total) by  mouth daily. 30 tablet 1   methocarbamol  (ROBAXIN ) 500 MG tablet Take 1 tablet (500 mg total) by mouth every 8 (eight) hours as needed (pain, muscle spasms). 60 tablet 1   traMADol  (ULTRAM ) 50 MG tablet Take 1 tablet (50 mg total) by mouth every 12 (twelve) hours as needed. 30 tablet 2   No facility-administered medications prior to visit.     ROS Review of Systems  Constitutional:  Positive for fever. Negative for activity change and appetite change.  HENT:  Positive for postnasal drip. Negative for sinus pressure and sore throat.   Respiratory:  Positive for cough. Negative for chest tightness, shortness of breath and wheezing.   Cardiovascular:  Negative for chest pain and palpitations.  Gastrointestinal:   Negative for abdominal distention, abdominal pain and constipation.  Genitourinary: Negative.   Musculoskeletal: Negative.   Psychiatric/Behavioral:  Negative for behavioral problems and dysphoric mood.     Objective:  BP 120/79 (Patient Position: Sitting, Cuff Size: Normal)   Pulse 64   Temp 98.3 F (36.8 C)   Ht 5' 2 (1.575 m)   Wt 170 lb 12.8 oz (77.5 kg)   SpO2 97%   BMI 31.24 kg/m      11/25/2023    8:45 AM 11/12/2023    2:31 PM 11/06/2023    9:07 AM  BP/Weight  Systolic BP 120 124 122  Diastolic BP 79 78 79  Wt. (Lbs) 170.8    BMI 31.24 kg/m2        Physical Exam Constitutional:      Appearance: He is well-developed.  HENT:     Right Ear: Tympanic membrane normal.     Left Ear: Tympanic membrane normal.     Mouth/Throat:     Comments: Slightly erythematous with a lot of mucus in oropharynx  Cardiovascular:     Rate and Rhythm: Normal rate.     Heart sounds: Normal heart sounds. No murmur heard. Pulmonary:     Effort: Pulmonary effort is normal.     Breath sounds: Normal breath sounds. No wheezing or rales.  Chest:     Chest wall: No tenderness.  Abdominal:     General: Bowel sounds are normal. There is no distension.     Palpations: Abdomen is soft. There is no mass.     Tenderness: There is no abdominal tenderness.   Musculoskeletal:        General: Normal range of motion.     Right lower leg: No edema.     Left lower leg: No edema.   Neurological:     Mental Status: He is alert and oriented to person, place, and time.   Psychiatric:        Mood and Affect: Mood normal.        Latest Ref Rng & Units 02/15/2023   10:25 AM 11/05/2022   10:20 AM 03/20/2022    3:00 PM  CMP  Glucose 70 - 99 mg/dL 93  97  87   BUN 6 - 20 mg/dL 20  16  10    Creatinine 0.61 - 1.24 mg/dL 1.61  0.96  0.45   Sodium 135 - 145 mmol/L 135  CANCELED  142   Potassium 3.5 - 5.1 mmol/L 4.7  CANCELED  4.1   Chloride 98 - 111 mmol/L 102  119  104   CO2 22 - 32 mmol/L 21   CANCELED  23   Calcium  8.9 - 10.3 mg/dL 9.5  9.4  9.1   Total Protein  6.5 - 8.1 g/dL 7.3  7.2    Total Bilirubin 0.3 - 1.2 mg/dL 1.5  0.2    Alkaline Phos 38 - 126 U/L 91  127    AST 15 - 41 U/L 35  37    ALT 0 - 44 U/L 44  52      Lipid Panel     Component Value Date/Time   CHOL 267 (H) 02/26/2023 0939   TRIG 440 (H) 02/26/2023 0939   HDL 31 (L) 02/26/2023 0939   CHOLHDL 4.5 06/20/2021 1024   LDLCALC 152 (H) 02/26/2023 0939    CBC    Component Value Date/Time   WBC 11.6 (H) 02/15/2023 1025   RBC 5.34 02/15/2023 1025   HGB 17.2 (H) 02/15/2023 1025   HGB 16.3 03/20/2022 1500   HCT 48.7 02/15/2023 1025   HCT 47.3 03/20/2022 1500   PLT 332 02/15/2023 1025   PLT 269 03/20/2022 1500   MCV 91.2 02/15/2023 1025   MCV 91 03/20/2022 1500   MCH 32.2 02/15/2023 1025   MCHC 35.3 02/15/2023 1025   RDW 13.0 02/15/2023 1025   RDW 12.5 03/20/2022 1500   LYMPHSABS 1.5 02/15/2023 1025   LYMPHSABS 1.5 03/20/2022 1500   MONOABS 0.8 02/15/2023 1025   EOSABS 0.0 02/15/2023 1025   EOSABS 0.1 03/20/2022 1500   BASOSABS 0.0 02/15/2023 1025   BASOSABS 0.0 03/20/2022 1500    Lab Results  Component Value Date   HGBA1C 5.5 05/15/2022      1. Acute sinusitis, recurrence not specified, unspecified location (Primary) - CBC with Differential/Platelet - benzonatate  (TESSALON ) 100 MG capsule; Take 1 capsule (100 mg total) by mouth 2 (two) times daily as needed for cough.  Dispense: 20 capsule; Refill: 0 - amoxicillin  (AMOXIL ) 500 MG capsule; Take 1 capsule (500 mg total) by mouth 3 (three) times daily for 10 days.  Dispense: 30 capsule; Refill: 0  2. Need for hepatitis C screening test - HCV Ab w Reflex to Quant PCR  3. Hypertriglyceridemia Uncontrolled Currently on fenofibrate  Low-cholesterol diet - LP+Non-HDL Cholesterol  4. Pure hypercholesterolemia Uncontrolled Currently on atorvastatin  80 mg Will check lipid panel and if still uncontrolled we will switch from atorvastatin  to  Crestor Low-cholesterol diet - LP+Non-HDL Cholesterol - CMP14+EGFR  5. Screening for diabetes mellitus - Hemoglobin A1c   Meds ordered this encounter  Medications   benzonatate  (TESSALON ) 100 MG capsule    Sig: Take 1 capsule (100 mg total) by mouth 2 (two) times daily as needed for cough.    Dispense:  20 capsule    Refill:  0   amoxicillin  (AMOXIL ) 500 MG capsule    Sig: Take 1 capsule (500 mg total) by mouth 3 (three) times daily for 10 days.    Dispense:  30 capsule    Refill:  0    Follow-up: Return in about 6 months (around 05/26/2024) for Chronic medical conditions.       Joaquin Mulberry, MD, FAAFP. [facility] [place], [place] [phone]   11/25/2023, 9:47 AM

## 2023-11-25 NOTE — Patient Instructions (Signed)
 Infeccin de los senos paranasales en los adultos Sinus Infection, Adult La infeccin de los senos paranasales, tambin llamada sinusitis, es la inflamacin de los senos paranasales. Los senos paranasales son espacios vacos en los huesos alrededor del rostro. Los senos paranasales se encuentran en estos lugares: Alrededor de los ojos. En la mitad de la frente. Detrs de Architectural technologist. En los pmulos. Normalmente, la mucosidad drena a travs de los senos. Cuando los tejidos nasales se inflaman o hinchan, la mucosidad puede quedar atrapada o bloqueada. Esto fomenta la proliferacin de bacterias, virus y hongos, lo que produce infecciones. La mayora de las infecciones de los senos paranasales son provocadas por un virus. Una infeccin de los senos paranasales puede desarrollarse rpidamente. Puede durar hasta 4 semanas (aguda) o ms de 12 semanas (crnica). Con frecuencia, la infeccin de los senos paranasales aparece despus de un resfriado. Cules son las causas? Esta afeccin es causada por cualquier sustancia que inflame los senos o evite que la mucosidad drene. Esto incluye: Alergias. Asma. Infeccin por bacterias o virus. Deformidades u obstrucciones en la nariz o los senos paranasales. Crecimientos anormales en la nariz (plipos nasales). Agentes contaminantes, como sustancias qumicas o irritantes presentes en el aire. Infeccin por hongos. Esto es poco frecuente. Qu incrementa el riesgo? Es ms probable que tenga esta afeccin si: Tienen debilitado el sistema de defensa del organismo (sistema inmunitario). Nada o bucea mucho. Abusa de los [employer]. Fuma. Cules son los signos o sntomas? Los principales sntomas de esta afeccin son dolor y sensacin de presin alrededor de los senos paranasales afectados. Otros sntomas incluyen: Nariz tapada o congestin que dificulta la respiracin por la nariz. Secrecin espesa de color amarillo o verdoso que sale de la Clinical cytogeneticist. Dolor,  hinchazn y Airline pilot en los senos paranasales afectados. Tos que puede empeorar por la noche. Disminucin del sentido del olfato y del gusto. Mucosidad excesiva que se acumula en la garganta o la parte posterior de la nariz (goteo posnasal) y causa dolor de garganta o mal aliento. Cansancio (fatiga). Grant Ruts. Cmo se diagnostica? Esta afeccin se diagnostica en funcin de lo siguiente: Los sntomas. Sus antecedentes mdicos. Un examen fsico. Pruebas para averiguar si la afeccin es [place] o crnica. Pueden incluir: Revisar la nariz para ver si tiene plipos nasales. Observar los senos paranasales con un dispositivo que tiene una luz (endoscopio). Hacer pruebas para detectar alergias o bacterias. Pruebas de diagnstico por imgenes, como Health visitor (RM) o una exploracin por tomografa computarizada (TC). En contadas ocasiones, se puede realizar una biopsia de hueso para descartar tipos ms graves de infecciones por hongos en los senos paranasales. Cmo se trata? El tratamiento para la infeccin de los senos paranasales depende de la causa y de si la afeccin es [place]. Si la causa es un virus, los sntomas Teaching laboratory technician solos en el trmino de [address]. Pueden darle medicamentos para aliviar los sntomas. Incluyen lo siguiente: Medicamentos para encoger las fosas nasales hinchadas (descongestivos). Un aerosol que [employer] inflamacin de las fosas nasales (corticoesteroide intranasal tpico). Enjuagues que ayudan a eliminar la mucosidad espesa de la nariz (lavados con solucin salina nasal). Medicamentos para tratar alergias (antihistamnicos). Analgsicos de [employer]. Si la causa son bacterias, el mdico puede recomendarle que espere para ver si los sntomas mejoran. La mayora de las infecciones bacterianas mejoran sin medicamentos antibiticos. Posiblemente le den antibiticos si usted tiene: Una infeccin grave. El sistema inmunitario debilitado. Si la causa  es un estrechamiento de las fosas nasales o la  presencia de plipos nasales, es posible que necesite [place]. Siga estas instrucciones en su casa: [employer], use o aplquese los medicamentos de venta libre y Building control surveyor como se lo haya indicado el mdico. Estos pueden incluir aerosoles nasales. Si le recetaron un antibitico, tmelo como se lo haya indicado el mdico. No deje de tomar el antibitico aunque comience a sentirse mejor. Hidrtese y humidifique los [employer] suficiente lquido como para Pharmacologist la orina de color amarillo plido. Mantenerse hidratado lo ayudar a [employer]. Use un humidificador de vapor fro para mantener la humedad de su hogar por encima del 50 %. Realice inhalaciones de vapor por 10 a 15 minutos, de 3 a 4 veces al da, o como se lo haya indicado el mdico. Puede hacer esto en el bao con el vapor del agua caliente de la ducha. Limite la exposicin al aire fro o seco. Reposo Descanse todo lo que pueda. Duerma con la cabeza levantada (elevada). Asegrese de dormir lo suficiente cada noche. Instrucciones generales  Aplquese un pao tibio y hmedo en la cara 3 o 4 veces al da o como se lo haya indicado el mdico. Esto ayuda a Optician, dispensing las [place]. Hgase lavados con solucin salina nasal con la frecuencia que le haya indicado el mdico. Lvese las manos frecuentemente con agua y jabn para reducir la exposicin a los grmenes. Use desinfectante para manos si no dispone de [place] y [place]. No fume. Evite estar cerca de personas que fuman (fumador pasivo). Concurra a todas las visitas de seguimiento. Esto es importante. Comunquese con un mdico si: Tiene fiebre. Sus sntomas empeoran. Los sntomas no mejoran en el trmino de [address]. Solicite ayuda de inmediato si: Tiene un dolor de cabeza intenso. Tiene vmitos persistentes. Tiene dolor intenso o hinchazn en la zona del rostro o los ojos. Tiene problemas de visin. Presenta  confusin. Tiene el cuello rgido. Tiene dificultad para respirar. Estos sntomas pueden Customer service manager. Solicite ayuda de inmediato. Llame al 911. No espere a ver si los sntomas desaparecen. No conduzca por sus propios medios [employer]. Resumen La infeccin de los senos paranasales es el dolor y la inflamacin de los senos paranasales. Los senos paranasales son espacios vacos en los huesos alrededor del rostro. La causa de esta afeccin es la inflamacin o hinchazn de los tejidos [place]. La hinchazn atrapa u obstruye el flujo de la mucosidad. Esto fomenta la proliferacin de bacterias, virus y hongos, lo que produce infecciones. Si le recetaron un antibitico, tmelo como se lo haya indicado el mdico. No deje de tomar el antibitico aunque comience a sentirse mejor. Concurra a todas las visitas de seguimiento. Esto es importante. Esta informacin no tiene Theme park manager el consejo del mdico. Asegrese de hacerle al mdico cualquier pregunta que tenga. Document Revised: 05/16/2021 Document Reviewed: 05/16/2021 Elsevier Patient Education  2024 [employer].

## 2023-11-26 ENCOUNTER — Other Ambulatory Visit: Payer: Self-pay

## 2023-11-26 ENCOUNTER — Ambulatory Visit: Payer: Self-pay | Admitting: Family Medicine

## 2023-11-26 LAB — CMP14+EGFR
ALT: 36 IU/L (ref 0–44)
AST: 24 IU/L (ref 0–40)
Albumin: 4.2 g/dL (ref 4.1–5.1)
Alkaline Phosphatase: 144 IU/L — ABNORMAL HIGH (ref 44–121)
BUN/Creatinine Ratio: 15 (ref 9–20)
BUN: 15 mg/dL (ref 6–24)
Bilirubin Total: 0.5 mg/dL (ref 0.0–1.2)
CO2: 18 mmol/L — ABNORMAL LOW (ref 20–29)
Calcium: 9.3 mg/dL (ref 8.7–10.2)
Chloride: 105 mmol/L (ref 96–106)
Creatinine, Ser: 1 mg/dL (ref 0.76–1.27)
Globulin, Total: 2.9 g/dL (ref 1.5–4.5)
Glucose: 92 mg/dL (ref 70–99)
Potassium: 4.1 mmol/L (ref 3.5–5.2)
Sodium: 140 mmol/L (ref 134–144)
Total Protein: 7.1 g/dL (ref 6.0–8.5)
eGFR: 95 mL/min/{1.73_m2} (ref >59)

## 2023-11-26 LAB — CBC WITH DIFFERENTIAL/PLATELET
Basophils Absolute: 0 10*3/uL (ref 0.0–0.2)
Basos: 0 %
EOS (ABSOLUTE): 0.2 10*3/uL (ref 0.0–0.4)
Eos: 2 %
Hematocrit: 51.3 % — ABNORMAL HIGH (ref 37.5–51.0)
Hemoglobin: 16.6 g/dL (ref 13.0–17.7)
Immature Grans (Abs): 0 10*3/uL (ref 0.0–0.1)
Immature Granulocytes: 0 %
Lymphocytes Absolute: 2.3 10*3/uL (ref 0.7–3.1)
Lymphs: 24 %
MCH: 31.3 pg (ref 26.6–33.0)
MCHC: 32.4 g/dL (ref 31.5–35.7)
MCV: 97 fL (ref 79–97)
Monocytes Absolute: 0.7 10*3/uL (ref 0.1–0.9)
Monocytes: 7 %
Neutrophils Absolute: 6.4 10*3/uL (ref 1.4–7.0)
Neutrophils: 67 %
Platelets: 316 10*3/uL (ref 150–450)
RBC: 5.31 x10E6/uL (ref 4.14–5.80)
RDW: 12.9 % (ref 11.6–15.4)
WBC: 9.5 10*3/uL (ref 3.4–10.8)

## 2023-11-26 LAB — LP+NON-HDL CHOLESTEROL
Cholesterol, Total: 137 mg/dL (ref 100–199)
HDL: 31 mg/dL — ABNORMAL LOW (ref >39)
LDL Chol Calc (NIH): 62 mg/dL (ref 0–99)
Total Non-HDL-Chol (LDL+VLDL): 106 mg/dL (ref 0–129)
Triglycerides: 276 mg/dL — ABNORMAL HIGH (ref 0–149)
VLDL Cholesterol Cal: 44 mg/dL — ABNORMAL HIGH (ref 5–40)

## 2023-11-26 LAB — HEMOGLOBIN A1C
Est. average glucose Bld gHb Est-mCnc: 114 mg/dL
Hgb A1c MFr Bld: 5.6 % (ref 4.8–5.6)

## 2023-11-26 LAB — HCV AB W REFLEX TO QUANT PCR: HCV Ab: NONREACTIVE

## 2023-11-26 LAB — HCV INTERPRETATION

## 2023-11-27 ENCOUNTER — Other Ambulatory Visit: Payer: Self-pay

## 2023-12-03 ENCOUNTER — Ambulatory Visit (INDEPENDENT_AMBULATORY_CARE_PROVIDER_SITE_OTHER): Payer: Self-pay | Admitting: Orthopedic Surgery

## 2023-12-03 ENCOUNTER — Other Ambulatory Visit: Payer: Self-pay

## 2023-12-03 DIAGNOSIS — M5412 Radiculopathy, cervical region: Secondary | ICD-10-CM

## 2023-12-03 MED ORDER — CYCLOBENZAPRINE HCL 10 MG PO TABS
10.0000 mg | ORAL_TABLET | Freq: Three times a day (TID) | ORAL | 1 refills | Status: DC | PRN
  Filled 2023-12-03: qty 60, 20d supply, fill #0
  Filled 2023-12-25: qty 60, 20d supply, fill #1

## 2023-12-03 NOTE — Progress Notes (Signed)
 Orthopedic Office Note  Patient has been seen multiple times in the office.  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  He has tried multiple conservative treatments without any relief.  We have talked about surgery as an option but he is not interested in that.  His most recent symptoms include cramping in the left arm.  He feels that mostly in the arm but sometimes in the forearm as well.  He said he feels it every 2-3 hours.  He has not tried any muscle relaxers recently.  He is interested in trying that.  He is still not interested in any kind of surgical intervention.  On exam, he is in no acute distress.  He has 5 out of 5 strength in all myotomes in bilateral upper extremities.  Sensation intact to light touch in C5-T1 nerve distributions bilaterally.  Hands warm well-perfused.  Plan:  -Prescribed Flexeril  to try for his cramping type pain -C4/5 ACDF discussed as an option but he is not interested -I do not have any other conservative treatments to try so would probably next refer him to pain management -Can return to the office on an as needed basis   Andrew DELENA Ada, MD Orthopedic Surgeon

## 2023-12-25 ENCOUNTER — Other Ambulatory Visit: Payer: Self-pay

## 2024-02-26 ENCOUNTER — Other Ambulatory Visit: Payer: Self-pay

## 2024-02-26 ENCOUNTER — Other Ambulatory Visit: Payer: Self-pay | Admitting: Family Medicine

## 2024-02-26 MED ORDER — CYCLOBENZAPRINE HCL 10 MG PO TABS
10.0000 mg | ORAL_TABLET | Freq: Three times a day (TID) | ORAL | 1 refills | Status: DC | PRN
  Filled 2024-02-26: qty 60, 20d supply, fill #0

## 2024-02-27 ENCOUNTER — Other Ambulatory Visit: Payer: Self-pay

## 2024-03-23 ENCOUNTER — Other Ambulatory Visit: Payer: Self-pay

## 2024-03-23 ENCOUNTER — Ambulatory Visit: Payer: Self-pay | Attending: Family Medicine

## 2024-03-23 DIAGNOSIS — Z23 Encounter for immunization: Secondary | ICD-10-CM

## 2024-03-24 ENCOUNTER — Ambulatory Visit (HOSPITAL_COMMUNITY)
Admission: EM | Admit: 2024-03-24 | Discharge: 2024-03-24 | Disposition: A | Discharge status: Home / Self Care | Payer: Self-pay | Attending: Family Medicine | Admitting: Family Medicine

## 2024-03-24 ENCOUNTER — Other Ambulatory Visit: Payer: Self-pay

## 2024-03-24 ENCOUNTER — Encounter (HOSPITAL_COMMUNITY): Payer: Self-pay | Admitting: Emergency Medicine

## 2024-03-24 DIAGNOSIS — J069 Acute upper respiratory infection, unspecified: Secondary | ICD-10-CM

## 2024-03-24 DIAGNOSIS — H65112 Acute and subacute allergic otitis media (mucoid) (sanguinous) (serous), left ear: Secondary | ICD-10-CM

## 2024-03-24 DIAGNOSIS — M546 Pain in thoracic spine: Secondary | ICD-10-CM

## 2024-03-24 MED ORDER — AMOXICILLIN-POT CLAVULANATE 875-125 MG PO TABS
1.0000 | ORAL_TABLET | Freq: Two times a day (BID) | ORAL | 0 refills | Status: DC
  Filled 2024-03-24: qty 14, 7d supply, fill #0

## 2024-03-24 MED ORDER — PROMETHAZINE-DM 6.25-15 MG/5ML PO SYRP
5.0000 mL | ORAL_SOLUTION | Freq: Four times a day (QID) | ORAL | 0 refills | Status: DC | PRN
  Filled 2024-03-24: qty 118, 6d supply, fill #0

## 2024-03-24 NOTE — Discharge Instructions (Addendum)
 Le revisaron por sntomas de las vas respiratorias altas. Hoy le recet un antibitico y un jarabe para la tos. Le recomiendo que use Motrin para el dolor de espalda y una almohadilla trmica. Descanse bien y aumente la ingesta de lquidos.  You were seen for upper respiratory symptoms.  I have sent out an antibiotic and cough syrup today.  I recommend you use motrin for back pain as well as a heating pad. Please get plenty of rest and increase fluids.

## 2024-03-24 NOTE — ED Triage Notes (Signed)
 Symptoms started Friday-03/19/2024.  Patient has a cough, bilateral ear and  left back pain.  No runny nose, no fever.  Cough is non-productive.    Patient is not taking any medications for symptoms

## 2024-03-24 NOTE — ED Provider Notes (Signed)
 [facility]    CSN: [id number] Arrival date & time: 03/24/24  1045      History   Chief Complaint Chief Complaint  Patient presents with   Cough    HPI Andrew Sandoval is a 43 y.o. male.    Cough Associated symptoms: ear pain and myalgias   Associated symptoms: no shortness of breath and no wheezing    Patient is here for a cough x 5 days.  Also having bilateral ear pain, and left mid back pain.  No fevers/chills.  No runny nose, congestion.  When he is coughing he has a hard time breathing.  No otc medications used.        Past Medical History:  Diagnosis Date   Cocaine use 2006-2010   Hyperlipidemia 06/09/2017    Patient Active Problem List   Diagnosis Date Noted   Left carpal tunnel syndrome 04/18/2023   Right carpal tunnel syndrome 02/02/2020   Chronic pruritic rash in adult 10/21/2019   Pain in both hands 08/18/2019   Acute pain of right shoulder 08/18/2019   Hyperlipidemia 06/09/2017   Enlarged tonsils 11/11/2016   Tinea pedis of right foot 11/11/2016   Numbness 11/11/2016   Positive H. pylori test 07/23/2016   Chronic nasal congestion 01/18/2015    Past Surgical History:  Procedure Laterality Date   No prior surgery         Home Medications    Prior to Admission medications   Medication Sig Start Date End Date Taking? Authorizing Provider  atorvastatin  (LIPITOR) 80 MG tablet Take 1 tablet (80 mg total) by mouth daily. 08/20/23   Danton Jon HERO, PA-C  augmented betamethasone  dipropionate (DIPROLENE ) 0.05 % ointment Apply topically 2 (two) times daily. 08/20/23   Danton Jon HERO, PA-C  benzonatate  (TESSALON ) 100 MG capsule Take 1 capsule (100 mg total) by mouth 2 (two) times daily as needed for cough. 11/25/23   Newlin, Enobong, MD  cetirizine  (ZYRTEC ) 10 MG tablet Take 1 tablet (10 mg total) by mouth daily. For rash and itching 08/20/23   Danton Jon HERO, PA-C  cyclobenzaprine  (FLEXERIL ) 10 MG tablet Take 1 tablet (10  mg total) by mouth 3 (three) times daily as needed for muscle spasms. 02/26/24   Newlin, Enobong, MD  DULoxetine  (CYMBALTA ) 60 MG capsule Take 1 capsule (60 mg total) by mouth daily. For cervical radiculopathy 01/23/23   Delbert Clam, MD  fenofibrate  (TRICOR ) 145 MG tablet Take 1 tablet (145 mg total) by mouth daily. 08/20/23   Danton Jon HERO, PA-C  fluticasone  (FLONASE ) 50 MCG/ACT nasal spray Place 2 sprays into both nostrils daily. 08/20/23   Danton Jon HERO, PA-C  lidocaine  (LIDODERM ) 5 % Place 1 patch onto the skin daily. Remove & Discard patch within 12 hours or as directed by MD 08/06/23   Georgina Ozell LABOR, MD  meloxicam  (MOBIC ) 7.5 MG tablet Take 1 tablet (7.5 mg total) by mouth daily. 08/20/23   McClung, Angela M, PA-C  traMADol  (ULTRAM ) 50 MG tablet Take 1 tablet (50 mg total) by mouth every 12 (twelve) hours as needed. 03/11/23   Jule Ronal CROME, PA-C    Family History Family History  Problem Relation Age of Onset   Diabetes Neg Hx     Social History Social History   Tobacco Use   Smoking status: Former   Smokeless tobacco: Never   Tobacco comments:    quit 12 years ago  Vaping Use   Vaping status: Never Used  Substance Use Topics  Alcohol use: No    Alcohol/week: 0.0 standard drinks of alcohol    Comment: quit 12 years ago   Drug use: Not Currently    Types: Cocaine, Marijuana    Comment: Prior cocaine; none in 11 years     Allergies   Tylenol [acetaminophen]   Review of Systems Review of Systems  Constitutional: Negative.   HENT:  Positive for ear pain.   Respiratory:  Positive for cough. Negative for shortness of breath and wheezing.   Cardiovascular: Negative.   Gastrointestinal: Negative.   Genitourinary: Negative.   Musculoskeletal:  Positive for myalgias.  Skin: Negative.   Hematological: Negative.   Psychiatric/Behavioral: Negative.       Physical Exam Triage Vital Signs ED Triage Vitals  Encounter Vitals Group     BP 03/24/24 1229 119/80      Girls Systolic BP Percentile --      Girls Diastolic BP Percentile --      Boys Systolic BP Percentile --      Boys Diastolic BP Percentile --      Pulse Rate 03/24/24 1229 86     Resp 03/24/24 1229 18     Temp 03/24/24 1229 97.8 F (36.6 C)     Temp Source 03/24/24 1229 Oral     SpO2 03/24/24 1229 96 %     Weight --      Height --      Head Circumference --      Peak Flow --      Pain Score 03/24/24 1224 5     Pain Loc --      Pain Education --      Exclude from Growth Chart --    No data found.  Updated Vital Signs BP 119/80 (BP Location: Right Arm)   Pulse 86   Temp 97.8 F (36.6 C) (Oral)   Resp 18   SpO2 96%   Visual Acuity Right Eye Distance:   Left Eye Distance:   Bilateral Distance:    Right Eye Near:   Left Eye Near:    Bilateral Near:     Physical Exam Constitutional:      General: He is not in acute distress.    Appearance: Normal appearance. He is normal weight. He is not ill-appearing or toxic-appearing.  HENT:     Left Ear: Tympanic membrane is erythematous and bulging.     Mouth/Throat:     Mouth: Mucous membranes are moist.  Cardiovascular:     Rate and Rhythm: Normal rate and regular rhythm.     Pulses: Normal pulses.     Heart sounds: Normal heart sounds.  Pulmonary:     Effort: Pulmonary effort is normal.     Breath sounds: Normal breath sounds. No wheezing or rhonchi.  Musculoskeletal:     Cervical back: Normal range of motion and neck supple. No tenderness.     Comments: TTP to the left mid back with light palpation;  pain with movement.   Lymphadenopathy:     Cervical: No cervical adenopathy.  Skin:    General: Skin is warm.  Neurological:     General: No focal deficit present.     Mental Status: He is alert.  Psychiatric:        Mood and Affect: Mood normal.      UC Treatments / Results  Labs (all labs ordered are listed, but only abnormal results are displayed) Labs Reviewed - No data to  display  EKG   Radiology  No results found.  Procedures Procedures (including critical care time)  Medications Ordered in UC Medications - No data to display  Initial Impression / Assessment and Plan / UC Course  I have reviewed the triage vital signs and the nursing notes.  Pertinent labs & imaging results that were available during my care of the patient were reviewed by me and considered in my medical decision making (see chart for details).    Final Clinical Impressions(s) / UC Diagnoses   Final diagnoses:  Viral URI with cough  Non-recurrent acute allergic otitis media of left ear  Acute left-sided thoracic back pain     Discharge Instructions      Le revisaron por sntomas de las vas respiratorias altas. Hoy le recet un antibitico y un jarabe para la tos. Le recomiendo que use Motrin para el dolor de espalda y una almohadilla trmica. Descanse bien y aumente la ingesta de lquidos.  You were seen for upper respiratory symptoms.  I have sent out an antibiotic and cough syrup today.  I recommend you use motrin for back pain as well as a heating pad. Please get plenty of rest and increase fluids.     ED Prescriptions     Medication Sig Dispense Auth. Provider   amoxicillin -clavulanate (AUGMENTIN ) 875-125 MG tablet Take 1 tablet by mouth every 12 (twelve) hours. 14 tablet Tayia Stonesifer, MD   promethazine -dextromethorphan (PROMETHAZINE -DM) 6.25-15 MG/5ML syrup Take 5 mLs by mouth 4 (four) times daily as needed for cough. 118 mL Darral Longs, MD      PDMP not reviewed this encounter.   Darral Longs, MD 03/24/24 1245

## 2024-03-30 ENCOUNTER — Telehealth: Payer: Self-pay

## 2024-03-30 ENCOUNTER — Other Ambulatory Visit: Payer: Self-pay | Admitting: Physician Assistant

## 2024-03-30 ENCOUNTER — Other Ambulatory Visit: Payer: Self-pay

## 2024-03-30 DIAGNOSIS — E7800 Pure hypercholesterolemia, unspecified: Secondary | ICD-10-CM

## 2024-03-30 MED ORDER — ATORVASTATIN CALCIUM 80 MG PO TABS
80.0000 mg | ORAL_TABLET | Freq: Every day | ORAL | 1 refills | Status: AC
  Filled 2024-03-30: qty 30, 30d supply, fill #0
  Filled 2024-05-03: qty 30, 30d supply, fill #1
  Filled 2024-06-01: qty 90, 90d supply, fill #2

## 2024-03-30 NOTE — Telephone Encounter (Signed)
 Medication has been sent.

## 2024-03-30 NOTE — Telephone Encounter (Signed)
 Patient requesting refills for cholesterol medication advised patient he may need an appointment. Appointment set for 10/29 9:10

## 2024-03-31 ENCOUNTER — Other Ambulatory Visit: Payer: Self-pay

## 2024-04-07 ENCOUNTER — Other Ambulatory Visit: Payer: Self-pay

## 2024-04-07 ENCOUNTER — Ambulatory Visit: Payer: Self-pay | Attending: Family Medicine | Admitting: Family Medicine

## 2024-04-07 ENCOUNTER — Encounter: Payer: Self-pay | Admitting: Family Medicine

## 2024-04-07 VITALS — BP 132/83 | HR 83 | Temp 97.7°F | Ht 62.0 in | Wt 169.4 lb

## 2024-04-07 DIAGNOSIS — Z87891 Personal history of nicotine dependence: Secondary | ICD-10-CM

## 2024-04-07 DIAGNOSIS — Z7902 Long term (current) use of antithrombotics/antiplatelets: Secondary | ICD-10-CM

## 2024-04-07 DIAGNOSIS — M5412 Radiculopathy, cervical region: Secondary | ICD-10-CM

## 2024-04-07 DIAGNOSIS — J328 Other chronic sinusitis: Secondary | ICD-10-CM

## 2024-04-07 DIAGNOSIS — H9203 Otalgia, bilateral: Secondary | ICD-10-CM

## 2024-04-07 DIAGNOSIS — Z79891 Long term (current) use of opiate analgesic: Secondary | ICD-10-CM

## 2024-04-07 DIAGNOSIS — T7840XD Allergy, unspecified, subsequent encounter: Secondary | ICD-10-CM

## 2024-04-07 MED ORDER — LIDOCAINE 5 % EX PTCH
1.0000 | MEDICATED_PATCH | CUTANEOUS | 0 refills | Status: AC
  Filled 2024-04-07: qty 30, 30d supply, fill #0

## 2024-04-07 MED ORDER — CETIRIZINE HCL 10 MG PO TABS
10.0000 mg | ORAL_TABLET | Freq: Every day | ORAL | 1 refills | Status: AC
  Filled 2024-04-07: qty 30, 30d supply, fill #0

## 2024-04-07 MED ORDER — FLUTICASONE PROPIONATE 50 MCG/ACT NA SUSP
2.0000 | Freq: Every day | NASAL | 1 refills | Status: AC
  Filled 2024-04-07: qty 16, 30d supply, fill #0
  Filled 2024-05-04: qty 16, 30d supply, fill #1

## 2024-04-07 NOTE — Progress Notes (Signed)
 Subjective:  Patient ID: Andrew Sandoval, male    DOB: 07/25/1980  Age: 43 y.o. MRN: [id number]  CC: Ear Pain (Bilateral ear pain)     Discussed the use of AI scribe software for clinical note transcription with the patient, who gave verbal consent to proceed.  History of Present Illness Quintel Mccalla is a 43 year old male with a history of hyperlipidemia, cervical radiculopathy.  who presents with bilateral ear pain and hearing difficulties.  He has experienced bilateral ear pain for six weeks, with a sensation of something inside the ears but no physical obstruction. There is no ear discharge. He has difficulty hearing in both ears, with a noise similar to an open faucet. No respiratory symptoms such as congestion or sinus pressure are present. His tonsils began hurting after the onset of ear pain. He recalls a burst eardrum two years ago with yellow liquid drainage but is unsure of the current status of his eardrum.  He is requesting refill of his Lidoderm  patches which he uses for neck pain.  Past Medical History:  Diagnosis Date   Cocaine use 2006-2010   Hyperlipidemia 06/09/2017    Past Surgical History:  Procedure Laterality Date   No prior surgery      Family History  Problem Relation Age of Onset   Diabetes Neg Hx     Social History   Socioeconomic History   Marital status: Single    Spouse name: Not on file   Number of children: Not on file   Years of education: Not on file   Highest education level: Not on file  Occupational History    Comment: [employer]  Tobacco Use   Smoking status: Former   Smokeless tobacco: Never   Tobacco comments:    quit 12 years ago  Vaping Use   Vaping status: Never Used  Substance and Sexual Activity   Alcohol use: No    Alcohol/week: 0.0 standard drinks of alcohol    Comment: quit 12 years ago   Drug use: Not Currently    Types: Cocaine, Marijuana    Comment: Prior cocaine; none in 11 years   Sexual activity:  Yes  Other Topics Concern   Not on file  Social History Narrative   Not on file   Social Drivers of Health   Financial Resource Strain: Not on file  Food Insecurity: Not on file  Transportation Needs: Not on file  Physical Activity: Not on file  Stress: Not on file  Social Connections: Not on file    Allergies  Allergen Reactions   Tylenol [Acetaminophen] Hives    Outpatient Medications Prior to Visit  Medication Sig Dispense Refill   atorvastatin  (LIPITOR) 80 MG tablet Take 1 tablet (80 mg total) by mouth daily. 90 tablet 1   amoxicillin -clavulanate (AUGMENTIN ) 875-125 MG tablet Take 1 tablet by mouth every 12 (twelve) hours. (Patient not taking: Reported on 04/07/2024) 14 tablet 0   augmented betamethasone  dipropionate (DIPROLENE ) 0.05 % ointment Apply topically 2 (two) times daily. (Patient not taking: Reported on 04/07/2024) 50 g 2   fluticasone  (FLONASE ) 50 MCG/ACT nasal spray Place 2 sprays into both nostrils daily. 16 g 6   lidocaine  (LIDODERM ) 5 % Place 1 patch onto the skin daily. Remove & Discard patch within 12 hours or as directed by MD 30 patch 0   meloxicam  (MOBIC ) 7.5 MG tablet Take 1 tablet (7.5 mg total) by mouth daily. (Patient not taking: Reported on 04/07/2024) 30 tablet 1  promethazine -dextromethorphan (PROMETHAZINE -DM) 6.25-15 MG/5ML syrup Take 5 mLs by mouth 4 (four) times daily as needed for cough. (Patient not taking: Reported on 04/07/2024) 118 mL 0   No facility-administered medications prior to visit.     ROS Review of Systems  Constitutional:  Negative for activity change and appetite change.  HENT:  Positive for ear pain and hearing loss. Negative for sinus pressure and sore throat.   Respiratory:  Negative for chest tightness, shortness of breath and wheezing.   Cardiovascular:  Negative for chest pain and palpitations.  Gastrointestinal:  Negative for abdominal distention, abdominal pain and constipation.  Genitourinary: Negative.    Musculoskeletal:  Positive for neck pain.  Psychiatric/Behavioral:  Negative for behavioral problems and dysphoric mood.     Objective:  BP 132/83   Pulse 83   Temp 97.7 F (36.5 C) (Oral)   Ht 5' 2 (1.575 m)   Wt 169 lb 6.4 oz (76.8 kg)   SpO2 97%   BMI 30.98 kg/m      04/07/2024    9:32 AM 03/24/2024   12:29 PM 11/25/2023    8:45 AM  BP/Weight  Systolic BP 132 119 120  Diastolic BP 83 80 79  Wt. (Lbs) 169.4  170.8  BMI 30.98 kg/m2  31.24 kg/m2      Physical Exam Constitutional:      Appearance: He is well-developed.  HENT:     Ears:     Comments: Small amount of dried cerumen in right eustachian tube but absent in the left Cardiovascular:     Rate and Rhythm: Normal rate.     Heart sounds: Normal heart sounds. No murmur heard. Pulmonary:     Effort: Pulmonary effort is normal.     Breath sounds: Normal breath sounds. No wheezing or rales.  Chest:     Chest wall: No tenderness.  Abdominal:     General: Bowel sounds are normal. There is no distension.     Palpations: Abdomen is soft. There is no mass.     Tenderness: There is no abdominal tenderness.  Musculoskeletal:        General: Normal range of motion.     Right lower leg: No edema.     Left lower leg: No edema.  Neurological:     Mental Status: He is alert and oriented to person, place, and time.  Psychiatric:        Mood and Affect: Mood normal.        Latest Ref Rng & Units 11/25/2023    9:26 AM 02/15/2023   10:25 AM 11/05/2022   10:20 AM  CMP  Glucose 70 - 99 mg/dL 92  93  97   BUN 6 - 24 mg/dL 15  20  16    Creatinine 0.76 - 1.27 mg/dL 8.99  9.05  9.03   Sodium 134 - 144 mmol/L 140  135  CANCELED   Potassium 3.5 - 5.2 mmol/L 4.1  4.7  CANCELED   Chloride 96 - 106 mmol/L 105  102  119   CO2 20 - 29 mmol/L 18  21  CANCELED   Calcium  8.7 - 10.2 mg/dL 9.3  9.5  9.4   Total Protein 6.0 - 8.5 g/dL 7.1  7.3  7.2   Total Bilirubin 0.0 - 1.2 mg/dL 0.5  1.5  0.2   Alkaline Phos 44 - 121 IU/L 144   91  127   AST 0 - 40 IU/L 24  35  37   ALT 0 -  44 IU/L 36  44  52     Lipid Panel     Component Value Date/Time   CHOL 137 11/25/2023 0926   TRIG 276 (H) 11/25/2023 0926   HDL 31 (L) 11/25/2023 0926   CHOLHDL 4.5 06/20/2021 1024   LDLCALC 62 11/25/2023 0926    CBC    Component Value Date/Time   WBC 9.5 11/25/2023 0926   WBC 11.6 (H) 02/15/2023 1025   RBC 5.31 11/25/2023 0926   RBC 5.34 02/15/2023 1025   HGB 16.6 11/25/2023 0926   HCT 51.3 (H) 11/25/2023 0926   PLT 316 11/25/2023 0926   MCV 97 11/25/2023 0926   MCH 31.3 11/25/2023 0926   MCH 32.2 02/15/2023 1025   MCHC 32.4 11/25/2023 0926   MCHC 35.3 02/15/2023 1025   RDW 12.9 11/25/2023 0926   LYMPHSABS 2.3 11/25/2023 0926   MONOABS 0.8 02/15/2023 1025   EOSABS 0.2 11/25/2023 0926   BASOSABS 0.0 11/25/2023 0926    Lab Results  Component Value Date   HGBA1C 5.6 11/25/2023        Assessment & Plan Sinusitis with associated ear pain and sore throat Bilateral ear pain and decreased hearing likely due to sinus pressure. Sore throat related to sinusitis. - Prescribed Cetirizine  for sinus symptoms. - Prescribed Fluticasone  nasal spray for sinus drainage and sore throat relief. - Sent medications to pharmacy.  Cervical radiculopathy Neck pain managed with Lidocaine  patches he is requesting refills. Arthritis diagnosed by orthopedist. - Continue Lidocaine  patches for neck pain.      Meds ordered this encounter  Medications   lidocaine  (LIDODERM ) 5 %    Sig: Place 1 patch onto the skin daily. Remove & Discard patch within 12 hours or as directed by MD    Dispense:  30 patch    Refill:  0   fluticasone  (FLONASE ) 50 MCG/ACT nasal spray    Sig: Place 2 sprays into both nostrils daily.    Dispense:  16 g    Refill:  1   cetirizine  (ZYRTEC ) 10 MG tablet    Sig: Take 1 tablet (10 mg total) by mouth daily.    Dispense:  30 tablet    Refill:  1    Follow-up: Return for previously scheduled appointment.        Corrina Sabin, MD, FAAFP. [facility] [place], [place] [phone]   04/07/2024, 12:34 PM

## 2024-04-07 NOTE — Patient Instructions (Signed)
 VISIT SUMMARY:  Today, you were seen for bilateral ear pain and hearing difficulties that have been ongoing for six weeks. You also reported a sore throat and neck pain. We discussed your symptoms and provided a treatment plan to help alleviate your discomfort.  YOUR PLAN:  -SINUSITIS WITH ASSOCIATED EAR PAIN AND SORE THROAT: Sinusitis is an inflammation of the sinuses that can cause pressure and pain in the ears and throat. You have been prescribed Cetirizine  to help with your sinus symptoms and Fluticasone  nasal spray to aid in sinus drainage and relieve your sore throat. Both medications have been sent to your pharmacy.  -NECK PAIN DUE TO OSTEOARTHRITIS: Osteoarthritis is a condition where the protective cartilage that cushions the ends of your bones wears down over time, causing pain and stiffness. You should continue using the Lidocaine  patches as prescribed by your orthopedist to manage your neck pain.  INSTRUCTIONS:  Please follow up with your primary care physician if your symptoms do not improve or if they worsen. Continue using the prescribed medications as directed. If you have any questions or concerns, do not hesitate to contact our office.

## 2024-04-12 ENCOUNTER — Encounter: Payer: Self-pay | Admitting: Radiology

## 2024-04-13 ENCOUNTER — Ambulatory Visit: Payer: Self-pay | Admitting: Family Medicine

## 2024-05-03 ENCOUNTER — Other Ambulatory Visit: Payer: Self-pay

## 2024-05-04 ENCOUNTER — Ambulatory Visit: Payer: Self-pay | Attending: Family Medicine | Admitting: Family Medicine

## 2024-05-04 ENCOUNTER — Other Ambulatory Visit: Payer: Self-pay

## 2024-05-04 ENCOUNTER — Encounter: Payer: Self-pay | Admitting: Family Medicine

## 2024-05-04 ENCOUNTER — Ambulatory Visit: Payer: Self-pay | Admitting: Nurse Practitioner

## 2024-05-04 VITALS — BP 113/74 | HR 71 | Temp 98.1°F | Ht 62.0 in | Wt 168.0 lb

## 2024-05-04 DIAGNOSIS — E7800 Pure hypercholesterolemia, unspecified: Secondary | ICD-10-CM

## 2024-05-04 DIAGNOSIS — M549 Dorsalgia, unspecified: Secondary | ICD-10-CM

## 2024-05-04 MED ORDER — MELOXICAM 7.5 MG PO TABS
7.5000 mg | ORAL_TABLET | Freq: Every day | ORAL | 1 refills | Status: AC
  Filled 2024-05-04: qty 30, 30d supply, fill #0
  Filled 2024-06-23: qty 30, 30d supply, fill #1

## 2024-05-04 MED ORDER — KETOROLAC TROMETHAMINE 60 MG/2ML IM SOLN
60.0000 mg | Freq: Once | INTRAMUSCULAR | Status: AC
  Administered 2024-05-04: 60 mg via INTRAMUSCULAR

## 2024-05-04 MED ORDER — TIZANIDINE HCL 4 MG PO TABS
4.0000 mg | ORAL_TABLET | Freq: Three times a day (TID) | ORAL | 1 refills | Status: AC | PRN
  Filled 2024-05-04: qty 60, 20d supply, fill #0
  Filled 2024-06-23: qty 60, 20d supply, fill #1

## 2024-05-04 NOTE — Patient Instructions (Signed)
 Dolor de espalda agudo en los adultos Acute Back Pain, Adult El dolor de espalda agudo es repentino y por lo general no dura mucho tiempo. Se debe generalmente a una lesin de los msculos y tejidos de la espalda. La lesin puede ser el resultado de: Estiramiento en exceso o desgarro de un msculo, tendn o ligamento. Los ligamentos son tejidos que [employer]. Levantar algo de forma incorrecta puede producir un esguince de espalda. Desgaste (degeneracin) de los discos vertebrales. Los discos vertebrales son tejidos circulares que proporcionan amortiguacin entre los huesos de la columna vertebral (vrtebras). Movimientos de giro, como al practicar deportes o realizar trabajos de [place]. Un golpe en la espalda. Artritis. Es posible Producer, television/film/video un examen fsico, anlisis de laboratorio u otros estudios de diagnstico por imgenes para Veterinary surgeon causa del Engineer, mining. El dolor de espalda agudo generalmente desaparece con reposo y cuidados en la casa. Siga estas instrucciones en su casa: Control del dolor, la rigidez y Architect los medicamentos de venta libre y los recetados solamente como se lo haya indicado el mdico. El tratamiento puede incluir medicamentos para Chief Technology Officer y la inflamacin que se toman por la boca o que se aplican sobre la piel, o relajantes musculares. El mdico puede recomendarle que se aplique hielo durante las primeras 24 a 48 horas despus del comienzo del Engineer, mining. Para hacer esto: Ponga el hielo en una bolsa plstica. Coloque una toalla entre la piel y Copy. Aplique el hielo durante 20 minutos, 2 o 3 veces por da. Retire el hielo si la piel se pone de color rojo brillante. Esto es [employer]. Si no puede sentir dolor, calor o fro, tiene un mayor riesgo de que se dae la zona. Si se lo indican, aplique calor en la zona afectada con la frecuencia que le haya indicado el mdico. Use la fuente de calor que el mdico le recomiende, como una compresa de  calor hmedo o una almohadilla trmica. Coloque una toalla entre la piel y la fuente de Airline pilot. Aplique calor durante 20 a 30 minutos. Retire la fuente de calor si la piel se pone de color rojo brillante. Esto es especialmente importante si no puede sentir dolor, calor o fro. Corre un mayor riesgo de sufrir quemaduras. Actividad  No permanezca en la cama. Hacer reposo en la cama por ms de 1 a 2 das puede demorar su recuperacin. Mantenga una buena postura al sentarse y pararse. No se incline hacia adelante al sentarse ni se encorve al pararse. Si trabaja en un escritorio, sintese cerca de este para no tener que inclinarse. Mantenga el mentn hacia abajo. Mantenga el cuello hacia atrs y los codos flexionados en un ngulo de 90 grados (ngulo recto). Cuando conduzca, sintese elevado y cerca del volante. Agregue un apoyo para la espalda (lumbar) al asiento del automvil, si es necesario. Realice caminatas cortas en superficies planas tan pronto como le sea posible. Trate de caminar un poco ms de Pharmacist, community. No se siente, conduzca o permanezca de pie en un mismo lugar durante ms de 30 minutos seguidos. Pararse o sentarse durante largos perodos de Contractor la espalda. No conduzca ni use maquinaria pesada mientras toma analgsicos recetados. Use tcnicas apropiadas para levantar objetos. Cuando se inclina y Solicitor un [place], utilice posiciones que no sobrecarguen tanto la espalda: Flexione las rodillas. Mantenga la carga cerca del cuerpo. No se tuerza. Haga actividad fsica habitualmente como se lo haya indicado el mdico. Hacer ejercicios ayuda  a que la espalda sane ms rpido y [place] a Automotive engineer las lesiones de la espalda al [employer] msculos fuertes y flexibles. Trabaje con un fisioterapeuta para crear un programa de ejercicios seguros, segn lo recomiende el mdico. Haga ejercicios como se lo haya indicado el fisioterapeuta. Estilo de vida Mantenga un peso saludable.  El sobrepeso sobrecarga la espalda y hace que resulte difcil tener una buena [place]. Evite actividades o situaciones que lo hagan sentirse ansioso o estresado. El estrs y la ansiedad aumentan la tensin muscular y pueden empeorar el dolor de espalda. Aprenda formas de [employer] ansiedad y [place], como a travs del ejercicio. Instrucciones generales [place] sobre un colchn firme en una posicin cmoda. Intente acostarse de costado, con las rodillas ligeramente flexionadas. Si se recuesta [employer], coloque una almohada debajo de las rodillas. Mantenga la cabeza y el cuello en lnea recta con la columna vertebral (posicin neutra) cuando use equipos electrnicos como telfonos inteligentes o tablets. Para hacer esto: Levante el telfono inteligente o la tablet para Education officer, community de inclinar la cabeza o el cuello para mirar hacia abajo. Coloque el telfono inteligente o la tablet al nivel de su cara mientras mira la pantalla. Siga el plan de tratamiento como se lo haya indicado el mdico. Esto puede incluir: Terapia cognitiva o conductual. Acupuntura o terapia de masajes. Yoga o meditacin. Comunquese con un mdico si: Siente un dolor que no se alivia con reposo o medicamentos. Siente mucho dolor que se extiende a las piernas o las nalgas. El dolor no mejora luego de 2 semanas. Siente dolor por la noche. Pierde peso sin proponrselo. Tiene fiebre o escalofros. Siente nuseas o vmitos. Siente dolor abdominal. Solicite ayuda de inmediato si: Tiene nuevos problemas para controlar la vejiga o los intestinos. Siente debilidad o adormecimiento inusuales en los brazos o en las piernas. Siente que va a desmayarse. Estos sntomas pueden representar un problema grave que constituye Radio broadcast assistant. No espere a ver si los sntomas desaparecen. Solicite atencin mdica de inmediato. Comunquese con el servicio de emergencias de su localidad (911 en los [place]). No conduzca por sus  propios medios [employer]. Resumen El dolor de espalda agudo es repentino y por lo general no dura mucho tiempo. Use tcnicas apropiadas para levantar objetos. Cuando se inclina y levanta un [place], utilice posiciones que no sobrecarguen tanto la espalda. Tome los medicamentos de venta libre y los recetados solamente como se lo haya indicado el mdico, y aplquese calor o hielo segn las indicaciones. Esta informacin no tiene Theme park manager el consejo del mdico. Asegrese de hacerle al mdico cualquier pregunta que tenga. Document Revised: 09/13/2020 Document Reviewed: 09/13/2020 Elsevier Patient Education  2024 [employer].

## 2024-05-04 NOTE — Progress Notes (Signed)
 Subjective:  Patient ID: Andrew Sandoval, male    DOB: 1981/04/28  Age: 43 y.o. MRN: [id number]  CC: Back Pain (Lower back  x5 days)     Discussed the use of AI scribe software for clinical note transcription with the patient, who gave verbal consent to proceed.  History of Present Illness Andrew Sandoval is a 43 year old male with a history of hyperlipidemia, cervical radiculopathy  who presents with acute low back pain for five days.  He reports severe low back pain for the past five days, rated 9/10, localized to the lower back without radiation. Pain worsens with position changes such as sitting, standing, or walking and interferes with daily activities like putting on socks.  He denies recent heavy lifting or specific injury. He has not tried medications or other treatments for this pain.  He is currently fasting in anticipation of labs and would like to have his cholesterol checked.   Visit was conducted with the aid of an [employer] video interpreter.  Past Medical History:  Diagnosis Date   Cocaine use 2006-2010   Hyperlipidemia 06/09/2017    Past Surgical History:  Procedure Laterality Date   No prior surgery      Family History  Problem Relation Age of Onset   Diabetes Neg Hx     Social History   Socioeconomic History   Marital status: Single    Spouse name: Not on file   Number of children: Not on file   Years of education: Not on file   Highest education level: Not on file  Occupational History    Comment: [employer]  Tobacco Use   Smoking status: Former   Smokeless tobacco: Never   Tobacco comments:    quit 12 years ago  Vaping Use   Vaping status: Never Used  Substance and Sexual Activity   Alcohol use: No    Alcohol/week: 0.0 standard drinks of alcohol    Comment: quit 12 years ago   Drug use: Not Currently    Types: Cocaine, Marijuana    Comment: Prior cocaine; none in 11 years   Sexual activity: Yes  Other Topics Concern   Not on file   Social History Narrative   Not on file   Social Drivers of Health   Financial Resource Strain: Not on file  Food Insecurity: Not on file  Transportation Needs: Not on file  Physical Activity: Not on file  Stress: Not on file  Social Connections: Not on file    Allergies  Allergen Reactions   Tylenol [Acetaminophen] Hives    Outpatient Medications Prior to Visit  Medication Sig Dispense Refill   atorvastatin  (LIPITOR) 80 MG tablet Take 1 tablet (80 mg total) by mouth daily. 90 tablet 1   cetirizine  (ZYRTEC ) 10 MG tablet Take 1 tablet (10 mg total) by mouth daily. 30 tablet 1   fluticasone  (FLONASE ) 50 MCG/ACT nasal spray Place 2 sprays into both nostrils daily. 16 g 1   lidocaine  (LIDODERM ) 5 % Place 1 patch onto the skin daily. Remove & Discard patch within 12 hours or as directed by MD (Patient not taking: Reported on 05/04/2024) 30 patch 0   No facility-administered medications prior to visit.     ROS Review of Systems  Constitutional:  Negative for activity change and appetite change.  HENT:  Negative for sinus pressure and sore throat.   Respiratory:  Negative for chest tightness, shortness of breath and wheezing.   Cardiovascular:  Negative for chest  pain and palpitations.  Gastrointestinal:  Negative for abdominal distention, abdominal pain and constipation.  Genitourinary: Negative.   Musculoskeletal:        See HPI  Psychiatric/Behavioral:  Negative for behavioral problems and dysphoric mood.     Objective:  BP 113/74   Pulse 71   Temp 98.1 F (36.7 C) (Oral)   Ht 5' 2 (1.575 m)   Wt 168 lb (76.2 kg)   SpO2 98%   BMI 30.73 kg/m      05/04/2024   10:51 AM 04/07/2024    9:32 AM 03/24/2024   12:29 PM  BP/Weight  Systolic BP 113 132 119  Diastolic BP 74 83 80  Wt. (Lbs) 168 169.4   BMI 30.73 kg/m2 30.98 kg/m2       Physical Exam Constitutional:      Appearance: He is well-developed.  Cardiovascular:     Rate and Rhythm: Normal rate.      Heart sounds: Normal heart sounds. No murmur heard. Pulmonary:     Effort: Pulmonary effort is normal.     Breath sounds: Normal breath sounds. No wheezing or rales.  Chest:     Chest wall: No tenderness.  Abdominal:     General: Bowel sounds are normal. There is no distension.     Palpations: Abdomen is soft. There is no mass.     Tenderness: There is no abdominal tenderness.  Musculoskeletal:     Right lower leg: No edema.     Left lower leg: No edema.     Comments: Severe tenderness on palpation of entire lumbar spine and paraspinal areas on the left and right lateral areas.  Reproducible pain in lumbar spine and attempting to elevate both legs  Neurological:     Mental Status: He is alert and oriented to person, place, and time.     Gait: Gait normal.     Deep Tendon Reflexes: Reflexes normal.  Psychiatric:        Mood and Affect: Mood normal.        Latest Ref Rng & Units 11/25/2023    9:26 AM 02/15/2023   10:25 AM 11/05/2022   10:20 AM  CMP  Glucose 70 - 99 mg/dL 92  93  97   BUN 6 - 24 mg/dL 15  20  16    Creatinine 0.76 - 1.27 mg/dL 8.99  9.05  9.03   Sodium 134 - 144 mmol/L 140  135  CANCELED   Potassium 3.5 - 5.2 mmol/L 4.1  4.7  CANCELED   Chloride 96 - 106 mmol/L 105  102  119   CO2 20 - 29 mmol/L 18  21  CANCELED   Calcium  8.7 - 10.2 mg/dL 9.3  9.5  9.4   Total Protein 6.0 - 8.5 g/dL 7.1  7.3  7.2   Total Bilirubin 0.0 - 1.2 mg/dL 0.5  1.5  0.2   Alkaline Phos 44 - 121 IU/L 144  91  127   AST 0 - 40 IU/L 24  35  37   ALT 0 - 44 IU/L 36  44  52     Lipid Panel     Component Value Date/Time   CHOL 137 11/25/2023 0926   TRIG 276 (H) 11/25/2023 0926   HDL 31 (L) 11/25/2023 0926   CHOLHDL 4.5 06/20/2021 1024   LDLCALC 62 11/25/2023 0926    CBC    Component Value Date/Time   WBC 9.5 11/25/2023 0926   WBC 11.6 (H) 02/15/2023 1025  RBC 5.31 11/25/2023 0926   RBC 5.34 02/15/2023 1025   HGB 16.6 11/25/2023 0926   HCT 51.3 (H) 11/25/2023 0926   PLT 316  11/25/2023 0926   MCV 97 11/25/2023 0926   MCH 31.3 11/25/2023 0926   MCH 32.2 02/15/2023 1025   MCHC 32.4 11/25/2023 0926   MCHC 35.3 02/15/2023 1025   RDW 12.9 11/25/2023 0926   LYMPHSABS 2.3 11/25/2023 0926   MONOABS 0.8 02/15/2023 1025   EOSABS 0.2 11/25/2023 0926   BASOSABS 0.0 11/25/2023 0926    Lab Results  Component Value Date   HGBA1C 5.6 11/25/2023       Assessment & Plan Musculoskeletal low back pain Acute low back pain with muscle strain, severe, localized, exacerbated by movement, no radiation to legs. Muscle strain likely due to tenderness and muscle involvement. - Administered pain relief injection -Toradol  - Prescribed muscle relaxant. - Prescribed meloxicam . - Advised heating pad and massage. - Recommended slow stretching exercises. - Discussed optional ice pack use.  Hyperlipidemia Controlled -Will send of lipid panel -Continue statin - Low-cholesterol diet      Meds ordered this encounter  Medications   tiZANidine  (ZANAFLEX ) 4 MG tablet    Sig: Take 1 tablet (4 mg total) by mouth every 8 (eight) hours as needed.    Dispense:  60 tablet    Refill:  1   meloxicam  (MOBIC ) 7.5 MG tablet    Sig: Take 1 tablet (7.5 mg total) by mouth daily.    Dispense:  30 tablet    Refill:  1   ketorolac  (TORADOL ) injection 60 mg    Follow-up: Return in about 6 months (around 11/01/2024) for Chronic medical conditions, cancel previous appointment.       Corrina Sabin, MD, FAAFP. [facility] [place], [place] [phone]   05/04/2024, 11:44 AM

## 2024-05-05 ENCOUNTER — Ambulatory Visit: Payer: Self-pay | Admitting: Family Medicine

## 2024-05-05 LAB — CMP14+EGFR
ALT: 56 IU/L — ABNORMAL HIGH (ref 0–44)
AST: 28 IU/L (ref 0–40)
Albumin: 4.5 g/dL (ref 4.1–5.1)
Alkaline Phosphatase: 135 IU/L — ABNORMAL HIGH (ref 47–123)
BUN/Creatinine Ratio: 13 (ref 9–20)
BUN: 12 mg/dL (ref 6–24)
Bilirubin Total: 0.8 mg/dL (ref 0.0–1.2)
CO2: 22 mmol/L (ref 20–29)
Calcium: 9.3 mg/dL (ref 8.7–10.2)
Chloride: 103 mmol/L (ref 96–106)
Creatinine, Ser: 0.9 mg/dL (ref 0.76–1.27)
Globulin, Total: 2.7 g/dL (ref 1.5–4.5)
Glucose: 92 mg/dL (ref 70–99)
Potassium: 4.5 mmol/L (ref 3.5–5.2)
Sodium: 139 mmol/L (ref 134–144)
Total Protein: 7.2 g/dL (ref 6.0–8.5)
eGFR: 108 mL/min/1.73 (ref >59)

## 2024-05-05 LAB — LP+NON-HDL CHOLESTEROL
Cholesterol, Total: 121 mg/dL (ref 100–199)
HDL: 37 mg/dL — ABNORMAL LOW (ref >39)
LDL Chol Calc (NIH): 66 mg/dL (ref 0–99)
Total Non-HDL-Chol (LDL+VLDL): 84 mg/dL (ref 0–129)
Triglycerides: 95 mg/dL (ref 0–149)
VLDL Cholesterol Cal: 18 mg/dL (ref 5–40)

## 2024-06-01 ENCOUNTER — Ambulatory Visit: Payer: Self-pay | Admitting: Family Medicine

## 2024-06-01 ENCOUNTER — Other Ambulatory Visit: Payer: Self-pay

## 2024-06-23 ENCOUNTER — Other Ambulatory Visit: Payer: Self-pay

## 2024-11-03 ENCOUNTER — Ambulatory Visit: Payer: Self-pay | Admitting: Family Medicine
